# Patient Record
Sex: Male | Born: 1955 | Race: White | Hispanic: No | Marital: Married | Smoking: Never smoker
Health system: Southern US, Community
[De-identification: ages and names within clinical notes are randomized; demographics above are authoritative.]

## PROBLEM LIST (undated history)

## (undated) DIAGNOSIS — R51 Headache: Secondary | ICD-10-CM

## (undated) DIAGNOSIS — K219 Gastro-esophageal reflux disease without esophagitis: Secondary | ICD-10-CM

## (undated) DIAGNOSIS — I4892 Unspecified atrial flutter: Principal | ICD-10-CM

## (undated) DIAGNOSIS — I10 Essential (primary) hypertension: Secondary | ICD-10-CM

## (undated) DIAGNOSIS — N200 Calculus of kidney: Secondary | ICD-10-CM

## (undated) DIAGNOSIS — G4730 Sleep apnea, unspecified: Secondary | ICD-10-CM

## (undated) DIAGNOSIS — R197 Diarrhea, unspecified: Secondary | ICD-10-CM

## (undated) DIAGNOSIS — E785 Hyperlipidemia, unspecified: Secondary | ICD-10-CM

## (undated) DIAGNOSIS — C801 Malignant (primary) neoplasm, unspecified: Secondary | ICD-10-CM

## (undated) DIAGNOSIS — J302 Other seasonal allergic rhinitis: Secondary | ICD-10-CM

## (undated) DIAGNOSIS — R519 Headache, unspecified: Secondary | ICD-10-CM

## (undated) DIAGNOSIS — J40 Bronchitis, not specified as acute or chronic: Secondary | ICD-10-CM

## (undated) DIAGNOSIS — M1990 Unspecified osteoarthritis, unspecified site: Secondary | ICD-10-CM

## (undated) HISTORY — DX: Gastro-esophageal reflux disease without esophagitis: K21.9

## (undated) HISTORY — DX: Hyperlipidemia, unspecified: E78.5

## (undated) HISTORY — DX: Unspecified atrial flutter: I48.92

## (undated) HISTORY — PX: BACK SURGERY: SHX140

## (undated) HISTORY — DX: Essential (primary) hypertension: I10

## (undated) HISTORY — PX: OTHER SURGICAL HISTORY: SHX169

---

## 1993-04-20 HISTORY — PX: LUMBAR DISC SURGERY: SHX700

## 2000-03-19 ENCOUNTER — Ambulatory Visit (HOSPITAL_COMMUNITY): Admission: RE | Admit: 2000-03-19 | Discharge: 2000-03-19 | Payer: Self-pay | Admitting: Family Medicine

## 2000-03-19 ENCOUNTER — Encounter: Payer: Self-pay | Admitting: Family Medicine

## 2000-06-23 ENCOUNTER — Ambulatory Visit (HOSPITAL_COMMUNITY): Admission: RE | Admit: 2000-06-23 | Discharge: 2000-06-23 | Payer: Self-pay | Admitting: Family Medicine

## 2000-06-23 ENCOUNTER — Encounter: Payer: Self-pay | Admitting: Family Medicine

## 2000-07-15 ENCOUNTER — Observation Stay (HOSPITAL_COMMUNITY): Admission: EM | Admit: 2000-07-15 | Discharge: 2000-07-16 | Payer: Self-pay | Admitting: *Deleted

## 2000-07-15 ENCOUNTER — Encounter: Payer: Self-pay | Admitting: Emergency Medicine

## 2000-11-06 ENCOUNTER — Emergency Department (HOSPITAL_COMMUNITY): Admission: EM | Admit: 2000-11-06 | Discharge: 2000-11-06 | Payer: Self-pay

## 2002-02-18 ENCOUNTER — Emergency Department (HOSPITAL_COMMUNITY): Admission: EM | Admit: 2002-02-18 | Discharge: 2002-02-18 | Payer: Self-pay | Admitting: *Deleted

## 2003-08-11 ENCOUNTER — Emergency Department (HOSPITAL_COMMUNITY): Admission: EM | Admit: 2003-08-11 | Discharge: 2003-08-11 | Payer: Self-pay | Admitting: Emergency Medicine

## 2005-03-10 ENCOUNTER — Encounter (INDEPENDENT_AMBULATORY_CARE_PROVIDER_SITE_OTHER): Payer: Self-pay | Admitting: Specialist

## 2005-03-10 ENCOUNTER — Ambulatory Visit (HOSPITAL_COMMUNITY): Admission: RE | Admit: 2005-03-10 | Discharge: 2005-03-10 | Payer: Self-pay | Admitting: Gastroenterology

## 2005-07-31 ENCOUNTER — Ambulatory Visit: Payer: Self-pay | Admitting: Cardiology

## 2005-08-05 ENCOUNTER — Ambulatory Visit: Payer: Self-pay

## 2008-05-15 ENCOUNTER — Ambulatory Visit: Payer: Self-pay | Admitting: Cardiology

## 2008-05-15 DIAGNOSIS — E119 Type 2 diabetes mellitus without complications: Secondary | ICD-10-CM | POA: Insufficient documentation

## 2008-05-15 DIAGNOSIS — I10 Essential (primary) hypertension: Secondary | ICD-10-CM | POA: Insufficient documentation

## 2008-05-15 LAB — CONVERTED CEMR LAB
BUN: 13 mg/dL (ref 6–23)
Creatinine, Ser: 1.1 mg/dL (ref 0.4–1.5)
GFR calc Af Amer: 90 mL/min
GFR calc non Af Amer: 75 mL/min
Glucose, Bld: 161 mg/dL — ABNORMAL HIGH (ref 70–99)
Potassium: 3.9 meq/L (ref 3.5–5.1)

## 2008-06-10 ENCOUNTER — Emergency Department (HOSPITAL_COMMUNITY): Admission: EM | Admit: 2008-06-10 | Discharge: 2008-06-10 | Payer: Self-pay | Admitting: Emergency Medicine

## 2008-06-25 ENCOUNTER — Ambulatory Visit: Payer: Self-pay | Admitting: Cardiology

## 2009-04-20 DIAGNOSIS — C801 Malignant (primary) neoplasm, unspecified: Secondary | ICD-10-CM

## 2009-04-20 HISTORY — PX: SKIN CANCER EXCISION: SHX779

## 2009-04-20 HISTORY — DX: Malignant (primary) neoplasm, unspecified: C80.1

## 2009-08-28 ENCOUNTER — Inpatient Hospital Stay (HOSPITAL_COMMUNITY): Admission: EM | Admit: 2009-08-28 | Discharge: 2009-08-30 | Payer: Self-pay | Admitting: Emergency Medicine

## 2009-08-28 ENCOUNTER — Ambulatory Visit: Payer: Self-pay | Admitting: Internal Medicine

## 2009-08-29 ENCOUNTER — Encounter: Payer: Self-pay | Admitting: Cardiology

## 2009-08-29 ENCOUNTER — Encounter: Payer: Self-pay | Admitting: Internal Medicine

## 2009-08-30 ENCOUNTER — Encounter (INDEPENDENT_AMBULATORY_CARE_PROVIDER_SITE_OTHER): Payer: Self-pay | Admitting: *Deleted

## 2009-09-05 ENCOUNTER — Telehealth: Payer: Self-pay | Admitting: Cardiology

## 2009-09-12 ENCOUNTER — Ambulatory Visit: Payer: Self-pay | Admitting: Internal Medicine

## 2009-09-12 DIAGNOSIS — I4892 Unspecified atrial flutter: Secondary | ICD-10-CM | POA: Insufficient documentation

## 2009-12-22 ENCOUNTER — Emergency Department (HOSPITAL_COMMUNITY): Admission: EM | Admit: 2009-12-22 | Discharge: 2009-12-22 | Payer: Self-pay | Admitting: Emergency Medicine

## 2010-05-20 NOTE — Letter (Signed)
Summary: Return To Work  [employer], Main Office  [address]   [place], [place] [address]   Phone: [phone]  Fax: [phone]    08/30/2009  TO: WHOM IT MAY CONCERN   RE: Ryan Jones [address] [id number]   The above named individual is under my medical care and was an inpatient in the hospital 08-28-09 until 08-30-09. H emay return to work Monday 09-02-09 with no restrictions.  If you have any further questions or need additional information, please call.     Sincerely,    Deliah Goody, RN

## 2010-05-20 NOTE — Assessment & Plan Note (Signed)
Summary: eph/ appt is 3:30 per dr. Tenny Craw.gd  Medications Added LISINOPRIL-HYDROCHLOROTHIAZIDE 20-25 MG TABS (LISINOPRIL-HYDROCHLOROTHIAZIDE) Take one tablet by mouth once daily. OMEPRAZOLE 20 MG CPDR (OMEPRAZOLE) Take one tablet by mouth once daily. ACTOS 15 MG TABS (PIOGLITAZONE HCL) Take one tablet by mouth twice daily. INDOMETHACIN 25 MG CAPS (INDOMETHACIN) Take one tablet by mouth once daily. PRADAXA 150 MG CAPS (DABIGATRAN ETEXILATE MESYLATE) 1 capsule two times a day ASPIRIN EC 325 MG TBEC (ASPIRIN) Take one tablet by mouth daily MULTIVITAMINS   TABS (MULTIPLE VITAMIN) Take one tablet by mouth once daily.      Allergies Added: ! CODEINE  Visit Type:  Follow-up   History of Present Illness: Mr. Dorris returns today for followup.  He is a pleasanat middle aged man with a h/o morbid obesity who was admitted to the hospital with symptomatic atrial flutter and underwent TEE guided DCCV.  The patient has been maintained on Pradaxa and felt well.  He notes that when he went out of rhythm, he could feel exactly when it occurred.  He has subsequently had rare palpitations which have been non-sustained.   Current Medications (verified): 1)  Lisinopril-Hydrochlorothiazide 20-25 Mg Tabs (Lisinopril-Hydrochlorothiazide) .... Take One Tablet By Mouth Once Daily. 2)  Omeprazole 20 Mg Cpdr (Omeprazole) .... Take One Tablet By Mouth Once Daily. 3)  Actos 15 Mg Tabs (Pioglitazone Hcl) .... Take One Tablet By Mouth Twice Daily. 4)  Indomethacin 25 Mg Caps (Indomethacin) .... Take One Tablet By Mouth Once Daily. 5)  Pradaxa 150 Mg Caps (Dabigatran Etexilate Mesylate) .Marland Kitchen.. 1 Capsule Two Times A Day 6)  Aspirin Ec 325 Mg Tbec (Aspirin) .... Take One Tablet By Mouth Daily 7)  Multivitamins   Tabs (Multiple Vitamin) .... Take One Tablet By Mouth Once Daily.  Allergies (verified): 1)  ! Codeine  Past History:  Past Medical History: Last updated: 05/15/2008 diabetes mellitus hx of  nephrolithiasis Hypertension G E R D gout kidney stones  Past Surgical History: Last updated: 05/15/2008 Back Surgery lumbar diskectomy [id number]  Review of Systems  The patient denies syncope, dyspnea on exertion, peripheral edema, and prolonged cough.    Vital Signs:  Patient profile:   55 year old male Height:      69 inches Weight:      351 pounds BMI:     52.02 Pulse rate:   74 / minute BP sitting:   112 / 60  (left arm)  Vitals Entered By: Laurance Flatten CMA (Sep 12, 2009 3:32 PM)  Physical Exam  General:  Morbidly obese, well developed, well nourished, in no acute distress.  HEENT: normal Neck: supple. No JVD. Carotids 2+ bilaterally no bruits Cor: RRR no rubs, gallops or murmur Lungs: CTA with no wheezes or rhonchi. Ab: soft, nontender. nondistended. No HSM. Good bowel sounds. Obese. Ext: warm. no cyanosis, clubbing or edema Neuro: alert and oriented. Grossly nonfocal. affect pleasant    Impression & Recommendations:  Problem # 1:  ATRIAL FLUTTER (ICD-427.32) His atrial flutter is currently quiet.  I have discussed the treatment options with the patient.  I have recommended we undergo a period of watchful waiting prior to proceeding with ablation.  He will continue on his current meds.  I am concerned that even if we proceed with flutter ablation he may ultimately have atrial fibrillation with his morbid obesity. His updated medication list for this problem includes:    Aspirin Ec 325 Mg Tbec (Aspirin) .Marland Kitchen... Take one tablet by mouth daily  Problem # 2:  ESSENTIAL HYPERTENSION, BENIGN (ICD-401.1) His blood pressure appears to be well controlled.  Continue current meds and maintain a low sodium diet. His updated medication list for this problem includes:    Lisinopril-hydrochlorothiazide 20-25 Mg Tabs (Lisinopril-hydrochlorothiazide) .Marland Kitchen... Take one tablet by mouth once daily.    Aspirin Ec 325 Mg Tbec (Aspirin) .Marland Kitchen... Take one tablet by mouth daily  Patient  Instructions: 1)  Your physician recommends that you schedule a follow-up appointment in: 4 months with Dr Ladona Ridgel

## 2010-05-20 NOTE — Progress Notes (Signed)
Summary: pt heart skipping beats   Phone Note Call from Patient Call back at Home Phone [phone]   Caller: Patient Reason for Call: Talk to Nurse, Talk to Doctor Summary of Call: pt has an appt on the 26th but his heart is skipping beats should he be seen before then Initial call taken by: Omer Jack,  Sep 05, 2009 10:11 AM  Follow-up for Phone Call        spoke with pt, he has been walking alot since he left the hosp and he is feeling his heart skip. his heart rate when on the phone with me is 52. he is anxious about the skipping, afraid his heart is going to stop. reassurance given to pt. he has an appt next week with dr Ladona Ridgel and will call prior to that appt with problems Deliah Goody, RN  Sep 05, 2009 2:51 PM\par

## 2010-07-08 LAB — CBC
HCT: 41.1 % (ref 39.0–52.0)
HCT: 42 % (ref 39.0–52.0)
HCT: 43.8 % (ref 39.0–52.0)
MCHC: 35.4 g/dL (ref 30.0–36.0)
MCV: 91.8 fL (ref 78.0–100.0)
MCV: 92.2 fL (ref 78.0–100.0)
Platelets: 122 10*3/uL — ABNORMAL LOW (ref 150–400)
Platelets: 136 10*3/uL — ABNORMAL LOW (ref 150–400)
Platelets: 151 10*3/uL (ref 150–400)
RDW: 13.2 % (ref 11.5–15.5)
RDW: 13.2 % (ref 11.5–15.5)
RDW: 13.2 % (ref 11.5–15.5)

## 2010-07-08 LAB — BASIC METABOLIC PANEL
BUN: 17 mg/dL (ref 6–23)
Creatinine, Ser: 1.01 mg/dL (ref 0.4–1.5)
GFR calc non Af Amer: >60 mL/min (ref >60)
Glucose, Bld: 109 mg/dL — ABNORMAL HIGH (ref 70–99)
Potassium: 4.3 mEq/L (ref 3.5–5.1)

## 2010-07-08 LAB — GLUCOSE, CAPILLARY
Glucose-Capillary: 114 mg/dL — ABNORMAL HIGH (ref 70–99)
Glucose-Capillary: 121 mg/dL — ABNORMAL HIGH (ref 70–99)
Glucose-Capillary: 127 mg/dL — ABNORMAL HIGH (ref 70–99)
Glucose-Capillary: 149 mg/dL — ABNORMAL HIGH (ref 70–99)
Glucose-Capillary: 173 mg/dL — ABNORMAL HIGH (ref 70–99)
Glucose-Capillary: 211 mg/dL — ABNORMAL HIGH (ref 70–99)

## 2010-07-08 LAB — POCT CARDIAC MARKERS
CKMB, poc: 2.6 ng/mL (ref 1.0–8.0)
CKMB, poc: 3.2 ng/mL (ref 1.0–8.0)
Troponin i, poc: <0.05 ng/mL (ref 0.00–0.09)

## 2010-07-08 LAB — CK TOTAL AND CKMB (NOT AT ARMC)
CK, MB: 3.9 ng/mL (ref 0.3–4.0)
Relative Index: 0.7 (ref 0.0–2.5)

## 2010-07-08 LAB — HEPARIN LEVEL (UNFRACTIONATED): Heparin Unfractionated: 0.66 IU/mL (ref 0.30–0.70)

## 2010-07-08 LAB — HEMOGLOBIN A1C: Hgb A1c MFr Bld: 7.1 % — ABNORMAL HIGH (ref <5.7)

## 2010-07-08 LAB — CARDIAC PANEL(CRET KIN+CKTOT+MB+TROPI)
CK, MB: 3.3 ng/mL (ref 0.3–4.0)
Relative Index: 0.7 (ref 0.0–2.5)
Total CK: 473 U/L — ABNORMAL HIGH (ref 7–232)

## 2010-09-02 NOTE — Assessment & Plan Note (Signed)
[facility]                            CARDIOLOGY OFFICE NOTE   NAME:Ryan Jones, Ryan Jones                     MRN:          [id number]  DATE:06/25/2008                            DOB:          Sep 03, 1955    Ryan Jones is a 55 year old gentleman that I saw on May 15, 2008.  It was noted at that time that he had a previous Myoview performed in  April 2007 that showed an ejection fraction of 63% and normal perfusion.  This was performed secondary to atypical chest pain.  When I saw him, he  was having problems with blood pressure and cramping sensation.  We  wondered whether the HCT was contributing as well as hypokalemia.  We  did check BMET that showed a potassium of 3.9 and his HCT was  discontinued.  He is continued on lisinopril 20 mg p.o. daily.  Since  then, he has tracked his blood pressure and it appears to be reasonable.  The systolic runs in the 120-130 range and his diastolic in the 60-70  range.  He denies any dyspnea, chest pain, palpitations, or syncope.  He  did have some problems with arthralgias, but was placed on allopurinol  by an orthopedist and his symptoms have completely resolved.  He has  also had a recent URI.  His medications at present include a  multivitamin daily, allergy injections, Avandamet, omeprazole 20 mg p.o.  daily, lisinopril 20 mg p.o. daily, allopurinol 100 mg p.o. daily.   PHYSICAL EXAMINATION:  VITAL SIGNS:  Blood pressure 155/89 and his pulse  is 96.  He weighs 348 pounds.  HEENT:  Normal.  NECK:  Supple.  CHEST:  Clear.  CARDIOVASCULAR:  Regular rate and rhythm.  ABDOMEN:  No tenderness.  EXTREMITIES:  No edema.   DIAGNOSES:  1. Hypertension - the patient's blood pressure is mildly elevated      today.  However, he did bring records and he is checking it at his      home and his systolic is typically in 120 to 130 range with the      diastolic in the 60 to 70 range.  We also checked his monitoring   compared to hours and it is accurate.  I therefore do not think we      need to increase his blood pressure regimen at this point.  He will      continue to track it and his lisinopril could be increased as      needed.  I have asked him to follow up his primary care physician      concerning this issue.  2. Recently diagnosed gout - the patient will continue on his      allopurinol and he will follow up with his primary care physician      concerning this issue as well.  3. Diabetes mellitus.   I will see him back on an as-needed basis.     Madolyn Frieze Jens Som, MD, [facility]  Electronically Signed   BSC/MedQ  DD: 06/25/2008  DT: 06/26/2008  Job #: [id number]

## 2010-09-02 NOTE — Assessment & Plan Note (Signed)
[facility]                            CARDIOLOGY OFFICE NOTE   NAME:Ryan Jones, Ryan Jones                     MRN:          [id number]  DATE:05/15/2008                            DOB:          1955-06-12    Ryan Jones is a pleasant 55 year old gentleman that I have not seen  since April 2007.  We last saw him at that time secondary to atypical  chest pain.  We did perform a Myoview on August 05, 2005, that showed an  ejection fraction of 63% and normal perfusion.  Since I last saw him, he  denies any dyspnea, chest pain, palpitations, or pedal edema.  Approximately 6 months ago, he did state that he had his metoprolol  discontinued as his blood pressure was falling and he was having  episodes of near syncope.  This did improve.  However, the patient now  states that over the past several weeks he has had problems with cramps  and arthralgias.  He ran out of his lisinopril HCT transiently and his  symptoms improved.  He resumed the medication and the symptoms returned.  He therefore requested to be seen for his cramps and his blood pressure.  He states his typical systolic blood pressure runs around 135 with the  diastolic in the 60s.   His present medications include:  1. Multivitamin daily.  2. Allergy injections.  3. Avandamet 07/998 daily.  4. Omeprazole 20 mg p.o. daily.  5. Lisinopril HCT 25/12.5 mg p.o. daily.   Physical exam shows a blood pressure 130/90 and his pulse is 70.  He  weighs 347 pounds.  His HEENT is normal.  His neck supple.  Chest clear.  Cardiovascular exam reveals a regular rate and rhythm.  Abdominal exam  shows no tenderness.  His extremities show no edema.   His electrocardiogram shows a sinus rhythm at a rate of 70.  The axis is  normal.  There are no ST changes noted.   DIAGNOSES:  1. Cramping/extremity pain - this may be related to low potassium.  We      will check a BMET today.  2. Hypertension - given the above  symptoms, we will discontinue the      patient's lisinopril HCT.  We will try to control his blood      pressure with lisinopril alone.  We will begin with 20 mg p.o.      daily.  I have asked him to track his blood pressure at home and      keep records for [place].  He will return in 6 weeks and he will bring      his cuff at that time to see if it correlates with ours.  We will      then adjust his regimen based on those results.  3. Diabetes mellitus - management per his primary care physician.     Madolyn Frieze Jens Som, MD, [facility]  Electronically Signed    BSC/MedQ  DD: 05/15/2008  DT: 05/15/2008  Job #: [id number]

## 2010-09-05 NOTE — H&P (Signed)
[facility]  Patient:    Ryan Jones, Ryan Jones                     MRN: [id number] Adm. Date:  [id number] Disc. Date: [id number] Attending:  Trinna Post CC:         Madolyn Frieze. Jens Som, M.D. [facility]   History and Physical  CHIEF COMPLAINT:  "Chest pain, weakness, and shortness of breath."  HISTORY OF PRESENT ILLNESS:  He is a 55 year old gentleman with a history of morbid obesity, hypertension, left ventricular hypertrophy, and gastroesophageal reflux disease, who presents with two episodes of chest pain over the past 36 hours.  Yesterday he was cycling and after about 10 minutes of exercise noted severe dyspnea followed by tightness in the left precordial area, with a radiation down the left upper extremity lasting approximately 30 minutes.  This went away with rest.  Then today around 12:30 p.m. while walking, he noted increased weakness and dizziness followed by heaviness substernally with associated dyspnea and diaphoresis.  He presented here to th emergency room for further evaluation.  EKG showed no acute changes, with initial negative enzymes.  He had recent 2D echocardiogram June 29, 2000, which showed left ventricular hypertrophy with no obvious left ventricular dysfunction.  His Lopressor dose was increased at that time from 50 mg to 100 mg.  He has noticed some nonspecific symptoms of increased weakness since then.  His cardiac risk factors include obesity, hypertension, and family history of coronary artery disease.  PAST MEDICAL HISTORY:  Chronic problems:  Hypertension with LVH by echocardiogram March 12, 202, gout, gastroesophageal reflux disease, and kidney stones.  MEDICATIONS: 1. Nexium 40 mg a day. 2. Lopressor 100 mg a day. 3. Hydrochlorothiazide 25 mg a day.  ALLERGIES:  CODEINE, which causes pruritus.  PAST SURGICAL HISTORY:  Lumbar diskectomy 1980, 1995.  SOCIAL HISTORY:  He is married with a 57 year old daughter.  He has  no history of smoking or alcohol use.  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  FAMILY HISTORY:  Father died of coronary artery disease complications, 61. Mother died at age 22, uncertain cause.  Ten siblings, one sister with coronary artery disease at age 1, a brother with type 2 diabetes.  REVIEW OF SYSTEMS:  As per HPI.  Otherwise noncontributory.  PHYSICAL EXAMINATION:  VITAL SIGNS:  Temperature is 97.4, blood pressure 141/70, pulse 56, O2 saturations are 98% on 2 L nasal cannula.  GENERAL:  He is an alert, morbidly obese 55 year old gentleman lying supine, in no apparent distress.  HEENT:  Oropharynx is clear.  Pupils are equal, round and reactive to light.  NECK:  Supple with no nodes or bruits.  CHEST:   Clear to auscultation.  CARDIAC:  Regular rhythm and rate without murmurs.  ABDOMEN:  Soft and nontender without hepatosplenomegaly.  EXTREMITIES:  No edema, 2+ dorsalis pedis pulses bilaterally.  Calves are nontender.  NEUROLOGIC:  Cranial nerves II-XII are normal.  LABORATORY DATA:  EKG:  Normal sinus rhythm with no acute changes.  CK is 236, MB 3.9, troponin negative.  CBC normal.  Electrolytes:  Sodium 141, potassium 4.1, BUN 10, creatinine 1.3.  IMPRESSION:  This is a 55 year old gentleman with moderate risk factors for coronary artery disease presenting with somewhat atypical chest pain, though at least one recent episode of the chest pain may have been related to exertion.  It  is possible that some of his recent symptoms such as weakness and dyspnea on exertion may be related to increased dose of beta blocker.  PLAN:  Admit to rule out MI.  He is placed on O2, aspirin, and p.r.n. nitroglycerin.  Reduce Lopressor to 50 mg q.d.  Cardiology consulted for further evaluation. DD:  07/15/00 TD:  07/16/00 Job: [id number]

## 2010-09-05 NOTE — Discharge Summary (Signed)
[facility]  Patient:    Ryan Jones, Ryan Jones                     MRN: [id number] Adm. Date:  [id number] Disc. Date: [id number] Attending:  Trinna Post CC:         Madolyn Frieze. Jens Som, M.D. [facility]   Discharge Summary  ADMISSION DIAGNOSES: 1. Atypical chest pain. 2. Hypertension. 3. Gastroesophageal reflux disease. 4. Weakness and dizziness (likely secondary to increased Lopressor).  DISCHARGE DIAGNOSES: 1. Atypical chest pain. 2. Hypertension. 3. Gastroesophageal reflux disease. 4. Weakness and dizziness (likely secondary to increased Lopressor).  HISTORY OF PRESENT ILLNESS:  This is a 55 year old white male with a past medical history significant for back surgery, GERD, PUD, nephrolithiasis, and hypertension.  Three weeks ago he had his Lopressor increased to 100 mg per day from 50 mg.  He has complained of some fatigue, shortness of breath since that time, and minimal dizziness.  He had some left upper sided chest pain without radiation, was not postprandial.  Today while walking, he developed sudden dizziness and weakness, came to the emergency room, no syncope, was felt that MI needed to be ruled out.  The rest of the history and physical can be obtained from the admit note.  HOSPITAL COURSE:  The patient was admitted.  EKG, CK-MB, troponins, etc., were checked.  Also a spiral CT was done to rule out pulmonary embolus which was normal.  His Lopressor was decreased to 50.  His blood pressure remained normal while in the hospital.  Initially, it was slightly low.  His telemetry showed no abnormalities.  It was felt he could be discharged, and will be scheduled for a stress Cardiolite on Monday.  DISPOSITION:  The patient is discharged to home with instructions to call if he has any chest discomfort.  DISCHARGE MEDICATIONS: 1. Lopressor 50 mg p.o. q.d. 2. Hydrochlorothiazide 25 mg p.o. q.d. 3. Nexium 40 mg p.o. q.d. 4. ECASA 325 mg p.o.  q.d.  ACTIVITY:  As tolerated.  DIET:  He will follow a low fat, sodium diet.  CONDITION ON DISCHARGE:  Improved.  He will call me if he has any questions over the long weekend.  Dr. Waunita Schooner office will call and let him know what time he needs to be seen on Monday for his stress Cardiolite.  LABORATORY DATA/X-RAYS:  On 06/23/00, he had an upper GI series that was normal. His EKG showed a sinus bradycardia, but no other abnormalities.  On March 6, he had a 2-D echocardiogram that showed left ventricular wall thickness, aortic valve thickness that was mildly increased, left atrium mildly dilated. He had a spiral CT done while in the hospital that was normal.  His troponin was 0.03.  Potassium 4.1.  BUN and creatinine 10/1.3, respectively.  Initial CK was 236 with an MB of 3.9 and a relative index of 1.7.  Second CK was 176 with an MB of 2.5, and a relative index of 1.4. DD:  07/16/00 TD:  07/16/00 Job: [id number]

## 2011-01-27 ENCOUNTER — Emergency Department (HOSPITAL_COMMUNITY): Payer: Federal, State, Local not specified - PPO

## 2011-01-27 ENCOUNTER — Emergency Department (HOSPITAL_COMMUNITY)
Admission: EM | Admit: 2011-01-27 | Discharge: 2011-01-27 | Disposition: A | Discharge status: Home / Self Care | Payer: Federal, State, Local not specified - PPO | Attending: Emergency Medicine | Admitting: Emergency Medicine

## 2011-01-27 DIAGNOSIS — R000 Tachycardia, unspecified: Secondary | ICD-10-CM | POA: Insufficient documentation

## 2011-01-27 DIAGNOSIS — F411 Generalized anxiety disorder: Secondary | ICD-10-CM | POA: Insufficient documentation

## 2011-01-27 DIAGNOSIS — I4891 Unspecified atrial fibrillation: Secondary | ICD-10-CM | POA: Insufficient documentation

## 2011-01-27 DIAGNOSIS — R002 Palpitations: Secondary | ICD-10-CM | POA: Insufficient documentation

## 2011-01-27 DIAGNOSIS — E669 Obesity, unspecified: Secondary | ICD-10-CM | POA: Insufficient documentation

## 2011-01-27 DIAGNOSIS — E119 Type 2 diabetes mellitus without complications: Secondary | ICD-10-CM | POA: Insufficient documentation

## 2011-01-27 DIAGNOSIS — Z8639 Personal history of other endocrine, nutritional and metabolic disease: Secondary | ICD-10-CM | POA: Insufficient documentation

## 2011-01-27 DIAGNOSIS — I4892 Unspecified atrial flutter: Secondary | ICD-10-CM | POA: Insufficient documentation

## 2011-01-27 DIAGNOSIS — Z79899 Other long term (current) drug therapy: Secondary | ICD-10-CM | POA: Insufficient documentation

## 2011-01-27 DIAGNOSIS — Z862 Personal history of diseases of the blood and blood-forming organs and certain disorders involving the immune mechanism: Secondary | ICD-10-CM | POA: Insufficient documentation

## 2011-01-27 DIAGNOSIS — I10 Essential (primary) hypertension: Secondary | ICD-10-CM | POA: Insufficient documentation

## 2011-01-27 DIAGNOSIS — R42 Dizziness and giddiness: Secondary | ICD-10-CM | POA: Insufficient documentation

## 2011-01-27 LAB — CBC
HCT: 41.3 % (ref 39.0–52.0)
Hemoglobin: 14.4 g/dL (ref 13.0–17.0)
MCH: 30.9 pg (ref 26.0–34.0)
MCHC: 34.9 g/dL (ref 30.0–36.0)
MCV: 88.6 fL (ref 78.0–100.0)

## 2011-01-27 LAB — BASIC METABOLIC PANEL
BUN: 13 mg/dL (ref 6–23)
CO2: 27 mEq/L (ref 19–32)
Chloride: 104 mEq/L (ref 96–112)
Creatinine, Ser: 0.96 mg/dL (ref 0.50–1.35)

## 2011-01-27 LAB — DIFFERENTIAL
Lymphocytes Relative: 30 % (ref 12–46)
Lymphs Abs: 1.6 10*3/uL (ref 0.7–4.0)
Monocytes Absolute: 0.4 10*3/uL (ref 0.1–1.0)
Monocytes Relative: 8 % (ref 3–12)
Neutro Abs: 2.8 10*3/uL (ref 1.7–7.7)

## 2011-01-28 ENCOUNTER — Encounter: Payer: Self-pay | Admitting: Cardiology

## 2011-01-28 ENCOUNTER — Encounter: Payer: Self-pay | Admitting: *Deleted

## 2011-01-28 ENCOUNTER — Ambulatory Visit (INDEPENDENT_AMBULATORY_CARE_PROVIDER_SITE_OTHER): Payer: Federal, State, Local not specified - PPO | Admitting: Cardiology

## 2011-01-28 VITALS — BP 120/70 | HR 62 | Ht 69.0 in | Wt 366.0 lb

## 2011-01-28 DIAGNOSIS — I4892 Unspecified atrial flutter: Secondary | ICD-10-CM

## 2011-01-28 DIAGNOSIS — I10 Essential (primary) hypertension: Secondary | ICD-10-CM

## 2011-01-28 NOTE — Progress Notes (Signed)
Ryan Jones male for followup of hypertension and prior atrial flutter. Prior Myoview in April of 2007 showed and ejection fraction of 63% and normal perfusion. In May of 2011the patient had an episode of atrial flutter. Echocardiogram showed normal LV function and he ultimately underwent cardioversion. Patient was seen in followup by Dr. Ladona Ridgel and a period of watchful waiting was recommended and not ablation. There was concern that the patient may develop atrial fibrillation in the future given his obesity. Patient seen in the emergency room yesterday with complaints of palpitations. Noted to be in atrial flutter. Started on Cardizem and aspirin. Troponin negative. Renal function, chest x-ray and hemoglobin normal. Since that time, his symptoms have resolved. He denies dyspnea, chest pain or syncope.   Current Outpatient Prescriptions  Medication Sig Dispense Refill  . aspirin 325 MG tablet Take 325 mg by mouth daily.        Marland Kitchen diltiazem (CARDIZEM CD) 180 MG 24 hr capsule Take 180 mg by mouth daily.        Marland Kitchen glipiZIDE (GLUCOTROL) 5 MG tablet Take 5 mg by mouth 2 (two) times daily before a meal.        . indomethacin (INDOCIN) 25 MG capsule Take 25 mg by mouth 2 (two) times daily with a meal.        . lisinopril-hydrochlorothiazide (PRINZIDE,ZESTORETIC) 20-25 MG per tablet Take 1 tablet by mouth daily.        . Multiple Vitamin (MULTIVITAMIN) capsule Take 1 capsule by mouth daily.        Marland Kitchen omeprazole (PRILOSEC) 20 MG capsule Take 20 mg by mouth daily.        . pioglitazone-metformin (ACTOPLUS MET) 15-850 MG per tablet Take 1 tablet by mouth 2 (two) times daily with a meal.           Past Medical History  Diagnosis Date  . Atrial flutter   . Hypertension   . GERD (gastroesophageal reflux disease)   . Diabetes mellitus   . Gout     No past surgical history on file.  History   Social History  . Marital Status: Married    Spouse Name: N/A    Number of Children: N/A  . Years of  Education: N/A   Occupational History  . Not on file.   Social History Main Topics  . Smoking status: Never Smoker   . Smokeless tobacco: Not on file  . Alcohol Use: Not on file  . Drug Use: Not on file  . Sexually Active: Not on file   Other Topics Concern  . Not on file   Social History Narrative  . No narrative on file    ROS: no fevers or chills, productive cough, hemoptysis, dysphasia, odynophagia, melena, hematochezia, dysuria, hematuria, rash, seizure activity, orthopnea, PND, pedal edema, claudication. Remaining systems are negative.  Physical Exam: Well-developed obese in no acute distress.  Skin is warm and dry.  HEENT is normal.  Neck is supple. No thyromegaly.  Chest is clear to auscultation with normal expansion.  Cardiovascular exam is regular rate and rhythm.  Abdominal exam nontender or distended. No masses palpated. Extremities show no edema. neuro grossly intact  ECG NSR, no ST changes 01/27/11 atrial flutter

## 2011-01-28 NOTE — Assessment & Plan Note (Signed)
Blood pressure controlled. Continue present medications. 

## 2011-01-28 NOTE — Patient Instructions (Signed)
Your physician wants you to follow-up in: As needed with Dr Jens Som   Your physician wants you to follow-up in: Make an appointment to see Dr Graciela Husbands for consideration for Atrial Flutter Ablation   Your physician recommends that you return for lab work in: today 01/28/11 [place])  Your physician has requested that you have an echocardiogram. Echocardiography is a painless test that uses sound waves to create images of your heart. It provides your doctor with information about the size and shape of your heart and how well your heart's chambers and valves are working. This procedure takes approximately one hour. There are no restrictions for this procedure.

## 2011-01-28 NOTE — Assessment & Plan Note (Signed)
Patient with bout of recurrent atrial flutter. Plan continue Cardizem. Check echocardiogram and TSH. Embolic risk factors of diabetes and hypertension. I will refer him to Dr. Graciela Husbands for atrial flutter ablation. I would like to avoid Coumadin long-term. No history of atrial fibrillation. Given that patient is in sinus rhythm today and atrial flutter could potentially be ablated in the near future we'll continue aspirin and not add Coumadin at this point.

## 2011-01-30 ENCOUNTER — Other Ambulatory Visit (HOSPITAL_COMMUNITY): Payer: Self-pay | Admitting: Cardiology

## 2011-01-30 ENCOUNTER — Ambulatory Visit (HOSPITAL_COMMUNITY): Payer: Federal, State, Local not specified - PPO | Attending: Cardiology | Admitting: Radiology

## 2011-01-30 DIAGNOSIS — IMO0002 Reserved for concepts with insufficient information to code with codable children: Secondary | ICD-10-CM

## 2011-01-30 DIAGNOSIS — I4892 Unspecified atrial flutter: Secondary | ICD-10-CM | POA: Insufficient documentation

## 2011-01-30 DIAGNOSIS — I10 Essential (primary) hypertension: Secondary | ICD-10-CM | POA: Insufficient documentation

## 2011-01-30 DIAGNOSIS — E119 Type 2 diabetes mellitus without complications: Secondary | ICD-10-CM | POA: Insufficient documentation

## 2011-01-30 MED ORDER — PERFLUTREN PROTEIN A MICROSPH IV SUSP
0.5000 mL | Freq: Once | INTRAVENOUS | Status: AC
  Administered 2011-01-30: 0.5 mL via INTRAVENOUS

## 2011-02-02 ENCOUNTER — Encounter: Payer: Self-pay | Admitting: *Deleted

## 2011-02-02 ENCOUNTER — Encounter: Payer: Self-pay | Admitting: Internal Medicine

## 2011-02-02 ENCOUNTER — Ambulatory Visit (INDEPENDENT_AMBULATORY_CARE_PROVIDER_SITE_OTHER): Payer: Federal, State, Local not specified - PPO | Admitting: Internal Medicine

## 2011-02-02 VITALS — BP 144/84 | HR 83 | Resp 18 | Ht 69.0 in | Wt 373.8 lb

## 2011-02-02 DIAGNOSIS — I4892 Unspecified atrial flutter: Secondary | ICD-10-CM

## 2011-02-02 DIAGNOSIS — I4891 Unspecified atrial fibrillation: Secondary | ICD-10-CM

## 2011-02-02 NOTE — Assessment & Plan Note (Signed)
The patient has recurrent documented typical atrial flutter. His thromboembolic risk profile as a CHADS score of 2;  We have discussed the role of catheter ablation both to minimize his symptoms associated with recurrent atrial flutter which is estimated to be 90+ percent as well as to hopefully reduce the risk of thromboembolism.  In the event that we were successful with ablation, I would not feel that he needed longer-term anticoagulation unless he developed recurrent atrial arrhythmias either flutter or fibrillation the likelihood of which the latter I estimated at 30-50%. Given his size, I recommended that we undertake the procedure with general anesthesia. He is agreeable. We've discussed potential benefits and risks of the procedure including but not limited to death perforation vascular injury and bleeding and heart block requiring pacer. He understands and agrees and is willing to proceed;  we'll keep him on aspirin and his short-term

## 2011-02-02 NOTE — Progress Notes (Signed)
HPI: [name] is seen at the request of Dr. Jens Som for consideration of catheter ablation of atrial flutter.  He initially presented in May 2011.Ryan Jones He underwent cardioversion. He saw Dr. Ladona Ridgel and .conservative watchful waiting was recommended because of concern about atrial fibrillation He then had another episode in October 2012; this was associated with significant symptoms of shortness of breath diaphoresis lightheadedness. He went to the emergency room and again reverted spontaneously.  The thromboembolic risk factors are notable for diabetes and hypertension. He is currently managed with aspirin  Prior Myoview in April of 2007 showed and ejection fraction of 63% and normal perfusion..     Current Outpatient Prescriptions  Medication Sig Dispense Refill  . aspirin 325 MG tablet Take 325 mg by mouth daily.        Ryan Jones diltiazem (CARDIZEM CD) 180 MG 24 hr capsule Take 180 mg by mouth daily.        Ryan Jones glipiZIDE (GLUCOTROL) 5 MG tablet Take 5 mg by mouth 2 (two) times daily before a meal.        . indomethacin (INDOCIN) 25 MG capsule Take 25 mg by mouth 2 (two) times daily with a meal.        . lisinopril-hydrochlorothiazide (PRINZIDE,ZESTORETIC) 20-25 MG per tablet Take 1 tablet by mouth daily.        . Multiple Vitamin (MULTIVITAMIN) capsule Take 1 capsule by mouth daily.        Ryan Jones omeprazole (PRILOSEC) 20 MG capsule Take 20 mg by mouth daily.        . pioglitazone-metformin (ACTOPLUS MET) 15-850 MG per tablet Take 1 tablet by mouth 2 (two) times daily with a meal.           Past Medical History  Diagnosis Date  . Atrial flutter   . Hypertension   . GERD (gastroesophageal reflux disease)   . Diabetes mellitus   . Gout     No past surgical history on file.  History   Social History  . Marital Status: Married    Spouse Name: N/A    Number of Children: N/A  . Years of Education: N/A   Occupational History  . Not on file.   Social History Main Topics  . Smoking status:  Never Smoker   . Smokeless tobacco: Never Used  . Alcohol Use: No  . Drug Use: No  . Sexually Active: Not on file   Other Topics Concern  . Not on file   Social History Narrative  . No narrative on file    ROS: no fevers or chills, productive cough, hemoptysis, dysphasia, odynophagia, melena, hematochezia, dysuria, hematuria, rash, seizure activity, orthopnea, PND, pedal edema, claudication. Remaining systems are negative.  Physical Exam: Well-developed obese middle-aged Caucasian male appearing his stated age in no acute distress.  Skin is warm and dry. Multiple skin to HEENT is normal.  Neck is supple. No thyromegaly Lymphadenopathy negative Chest is clear to auscultation with normal expansion.  Cardiovascular exam is irregular, with occasional dropped beat Abdominal exam nontender or distended. No masses palpated. Extremities show no edema Clubbing or cyanosis Neuro alert and oriented x3 grossly normal motor and sensory function Affect is engaging Pulses are intact  ECG NSR, no ST changes 01/27/11 atrial flutter

## 2011-02-04 ENCOUNTER — Encounter: Payer: Self-pay | Admitting: *Deleted

## 2011-02-19 ENCOUNTER — Telehealth: Payer: Self-pay | Admitting: Internal Medicine

## 2011-02-19 NOTE — Telephone Encounter (Signed)
Having procedure next week, wants to know if/when he should dc diltiazem and/or aspirin, pls call [phone]

## 2011-02-19 NOTE — Telephone Encounter (Signed)
Patient is aware to continue taken Aspirin and Diltiazem as recommended. Patient verbalized understanding.

## 2011-02-23 ENCOUNTER — Other Ambulatory Visit (INDEPENDENT_AMBULATORY_CARE_PROVIDER_SITE_OTHER): Payer: Federal, State, Local not specified - PPO | Admitting: *Deleted

## 2011-02-23 ENCOUNTER — Encounter (HOSPITAL_COMMUNITY): Payer: Self-pay

## 2011-02-23 DIAGNOSIS — I4891 Unspecified atrial fibrillation: Secondary | ICD-10-CM

## 2011-02-23 LAB — CBC WITH DIFFERENTIAL/PLATELET
Basophils Absolute: 0 10*3/uL (ref 0.0–0.1)
Basophils Relative: 0.5 % (ref 0.0–3.0)
Eosinophils Absolute: 0.3 10*3/uL (ref 0.0–0.7)
HCT: 41.7 % (ref 39.0–52.0)
Hemoglobin: 14.3 g/dL (ref 13.0–17.0)
Lymphs Abs: 1.5 10*3/uL (ref 0.7–4.0)
MCHC: 34.4 g/dL (ref 30.0–36.0)
Monocytes Relative: 7.4 % (ref 3.0–12.0)
Neutro Abs: 3.3 10*3/uL (ref 1.4–7.7)
RDW: 13.7 % (ref 11.5–14.6)

## 2011-02-23 LAB — BASIC METABOLIC PANEL
BUN: 19 mg/dL (ref 6–23)
Chloride: 105 mEq/L (ref 96–112)
Potassium: 4.3 mEq/L (ref 3.5–5.1)

## 2011-02-25 MED ORDER — SODIUM CHLORIDE 0.45 % IV SOLN: INTRAVENOUS | Status: DC

## 2011-02-26 ENCOUNTER — Other Ambulatory Visit: Payer: Self-pay

## 2011-02-26 ENCOUNTER — Ambulatory Visit (HOSPITAL_COMMUNITY): Payer: Federal, State, Local not specified - PPO | Admitting: Certified Registered Nurse Anesthetist

## 2011-02-26 ENCOUNTER — Encounter (HOSPITAL_COMMUNITY): Payer: Self-pay | Admitting: Certified Registered Nurse Anesthetist

## 2011-02-26 ENCOUNTER — Ambulatory Visit (HOSPITAL_COMMUNITY)
Admission: RE | Admit: 2011-02-26 | Discharge: 2011-02-27 | Disposition: A | Discharge status: Home / Self Care | Payer: Federal, State, Local not specified - PPO | Source: Ambulatory Visit | Attending: Internal Medicine | Admitting: Internal Medicine

## 2011-02-26 ENCOUNTER — Encounter (HOSPITAL_COMMUNITY)
Admission: RE | Disposition: A | Discharge status: Home / Self Care | Payer: Self-pay | Source: Ambulatory Visit | Attending: Internal Medicine

## 2011-02-26 ENCOUNTER — Encounter (HOSPITAL_COMMUNITY): Payer: Self-pay | Admitting: General Practice

## 2011-02-26 DIAGNOSIS — I4892 Unspecified atrial flutter: Secondary | ICD-10-CM

## 2011-02-26 DIAGNOSIS — E119 Type 2 diabetes mellitus without complications: Secondary | ICD-10-CM | POA: Insufficient documentation

## 2011-02-26 DIAGNOSIS — I10 Essential (primary) hypertension: Secondary | ICD-10-CM | POA: Insufficient documentation

## 2011-02-26 DIAGNOSIS — K219 Gastro-esophageal reflux disease without esophagitis: Secondary | ICD-10-CM | POA: Insufficient documentation

## 2011-02-26 DIAGNOSIS — J302 Other seasonal allergic rhinitis: Secondary | ICD-10-CM

## 2011-02-26 DIAGNOSIS — M109 Gout, unspecified: Secondary | ICD-10-CM | POA: Insufficient documentation

## 2011-02-26 HISTORY — PX: CARDIAC ELECTROPHYSIOLOGY STUDY AND ABLATION: SHX1294

## 2011-02-26 HISTORY — DX: Diarrhea, unspecified: R19.7

## 2011-02-26 HISTORY — DX: Unspecified osteoarthritis, unspecified site: M19.90

## 2011-02-26 HISTORY — DX: Unspecified atrial flutter: I48.92

## 2011-02-26 HISTORY — DX: Malignant (primary) neoplasm, unspecified: C80.1

## 2011-02-26 HISTORY — PX: ATRIAL FLUTTER ABLATION: SHX5733

## 2011-02-26 HISTORY — DX: Other seasonal allergic rhinitis: J30.2

## 2011-02-26 HISTORY — DX: Bronchitis, not specified as acute or chronic: J40

## 2011-02-26 LAB — GLUCOSE, CAPILLARY
Glucose-Capillary: 125 mg/dL — ABNORMAL HIGH (ref 70–99)
Glucose-Capillary: 110 mg/dL — ABNORMAL HIGH (ref 70–99)

## 2011-02-26 SURGERY — ATRIAL FLUTTER ABLATION
Anesthesia: General

## 2011-02-26 MED ORDER — GLIPIZIDE 5 MG PO TABS
5.0000 mg | ORAL_TABLET | Freq: Two times a day (BID) | ORAL | Status: DC
  Administered 2011-02-27: 5 mg via ORAL
  Filled 2011-02-26 (×3): qty 1

## 2011-02-26 MED ORDER — ONDANSETRON HCL 4 MG/2ML IJ SOLN: 4.0000 mg | Freq: Four times a day (QID) | INTRAMUSCULAR | Status: DC | PRN

## 2011-02-26 MED ORDER — ASPIRIN EC 81 MG PO TBEC
81.0000 mg | DELAYED_RELEASE_TABLET | Freq: Every day | ORAL | Status: DC
  Filled 2011-02-26: qty 1

## 2011-02-26 MED ORDER — PIOGLITAZONE HCL-METFORMIN HCL 15-850 MG PO TABS: 1.0000 | ORAL_TABLET | Freq: Two times a day (BID) | ORAL | Status: DC

## 2011-02-26 MED ORDER — PROMETHAZINE HCL 25 MG/ML IJ SOLN: 6.2500 mg | INTRAMUSCULAR | Status: DC | PRN

## 2011-02-26 MED ORDER — SUCCINYLCHOLINE CHLORIDE 20 MG/ML IJ SOLN
INTRAMUSCULAR | Status: DC | PRN
  Administered 2011-02-26: 140 mg via INTRAVENOUS

## 2011-02-26 MED ORDER — MIDAZOLAM HCL 5 MG/5ML IJ SOLN
INTRAMUSCULAR | Status: DC | PRN
  Administered 2011-02-26: 2 mg via INTRAVENOUS

## 2011-02-26 MED ORDER — PHENYLEPHRINE HCL 10 MG/ML IJ SOLN
INTRAMUSCULAR | Status: DC | PRN
  Administered 2011-02-26 (×5): .04 mg via INTRAVENOUS

## 2011-02-26 MED ORDER — LACTATED RINGERS IV SOLN
INTRAVENOUS | Status: DC | PRN
  Administered 2011-02-26: 11:00:00 via INTRAVENOUS

## 2011-02-26 MED ORDER — ACETAMINOPHEN 325 MG PO TABS
650.0000 mg | ORAL_TABLET | ORAL | Status: DC | PRN
  Filled 2011-02-26: qty 2

## 2011-02-26 MED ORDER — SODIUM CHLORIDE 0.45 % IV SOLN
INTRAVENOUS | Status: DC
  Administered 2011-02-26: 10:00:00 via INTRAVENOUS

## 2011-02-26 MED ORDER — PANTOPRAZOLE SODIUM 40 MG PO TBEC
40.0000 mg | DELAYED_RELEASE_TABLET | Freq: Every day | ORAL | Status: DC
  Administered 2011-02-26: 40 mg via ORAL
  Filled 2011-02-26: qty 1

## 2011-02-26 MED ORDER — FENTANYL CITRATE 0.05 MG/ML IJ SOLN
INTRAMUSCULAR | Status: DC | PRN
  Administered 2011-02-26: 50 ug via INTRAVENOUS
  Administered 2011-02-26: 100 ug via INTRAVENOUS

## 2011-02-26 MED ORDER — SODIUM CHLORIDE 0.9 % IJ SOLN
3.0000 mL | INTRAMUSCULAR | Status: DC | PRN
  Administered 2011-02-27: 3 mL via INTRAVENOUS

## 2011-02-26 MED ORDER — LISINOPRIL 20 MG PO TABS
20.0000 mg | ORAL_TABLET | Freq: Every day | ORAL | Status: DC
  Filled 2011-02-26: qty 1

## 2011-02-26 MED ORDER — PIOGLITAZONE HCL 15 MG PO TABS
15.0000 mg | ORAL_TABLET | Freq: Two times a day (BID) | ORAL | Status: DC
  Administered 2011-02-27: 15 mg via ORAL
  Filled 2011-02-26 (×3): qty 1

## 2011-02-26 MED ORDER — EPHEDRINE SULFATE 50 MG/ML IJ SOLN
INTRAMUSCULAR | Status: DC | PRN
  Administered 2011-02-26 (×2): 5 mg via INTRAVENOUS

## 2011-02-26 MED ORDER — LACTATED RINGERS IV SOLN: INTRAVENOUS | Status: DC

## 2011-02-26 MED ORDER — SODIUM CHLORIDE 0.9 % IV SOLN: 250.0000 mL | INTRAVENOUS | Status: AC

## 2011-02-26 MED ORDER — MIDAZOLAM HCL 2 MG/2ML IJ SOLN: 0.5000 mg | Freq: Once | INTRAMUSCULAR | Status: DC | PRN

## 2011-02-26 MED ORDER — ONDANSETRON HCL 4 MG/2ML IJ SOLN
INTRAMUSCULAR | Status: DC | PRN
  Administered 2011-02-26: 4 mg via INTRAVENOUS

## 2011-02-26 MED ORDER — LISINOPRIL-HYDROCHLOROTHIAZIDE 20-25 MG PO TABS: 1.0000 | ORAL_TABLET | Freq: Every day | ORAL | Status: DC

## 2011-02-26 MED ORDER — HYDROCHLOROTHIAZIDE 25 MG PO TABS
25.0000 mg | ORAL_TABLET | Freq: Every day | ORAL | Status: DC
  Filled 2011-02-26: qty 1

## 2011-02-26 MED ORDER — METFORMIN HCL 850 MG PO TABS
850.0000 mg | ORAL_TABLET | Freq: Two times a day (BID) | ORAL | Status: DC
  Administered 2011-02-27: 850 mg via ORAL
  Filled 2011-02-26 (×3): qty 1

## 2011-02-26 MED ORDER — PROPOFOL 10 MG/ML IV EMUL
INTRAVENOUS | Status: DC | PRN
  Administered 2011-02-26: 320 mg via INTRAVENOUS

## 2011-02-26 MED ORDER — DILTIAZEM HCL ER COATED BEADS 180 MG PO CP24
180.0000 mg | ORAL_CAPSULE | Freq: Every day | ORAL | Status: DC
  Filled 2011-02-26: qty 1

## 2011-02-26 MED ORDER — MEPERIDINE HCL 25 MG/ML IJ SOLN: 6.2500 mg | INTRAMUSCULAR | Status: DC | PRN

## 2011-02-26 NOTE — Anesthesia Preprocedure Evaluation (Addendum)
Anesthesia Evaluation  Patient identified by MRN, date of birth, ID band Patient awake    Reviewed: Allergy & Precautions, H&P , NPO status , Patient's Chart, lab work & pertinent test results  Airway Mallampati: III TM Distance: >3 FB Neck ROM: Full    Dental  (+) Teeth Intact, Caps and Dental Advisory Given   Pulmonary          Cardiovascular hypertension, Pt. on medications Regular Normal H/o aflutter   Neuro/Psych    GI/Hepatic GERD-  Medicated and Controlled,  Endo/Other  Diabetes mellitus-, Well Controlled, Type 2, Oral Hypoglycemic AgentsMorbid obesity  Renal/GU      Musculoskeletal   Abdominal (+) obese,   Peds  Hematology   Anesthesia Other Findings   Reproductive/Obstetrics                          Anesthesia Physical Anesthesia Plan  ASA: III  Anesthesia Plan: General   Post-op Pain Management:    Induction: Cricoid pressure planned and Intravenous  Airway Management Planned: Oral ETT  Additional Equipment:   Intra-op Plan:   Post-operative Plan: Possible Post-op intubation/ventilation  Informed Consent: I have reviewed the patients History and Physical, chart, labs and discussed the procedure including the risks, benefits and alternatives for the proposed anesthesia with the patient or authorized representative who has indicated his/her understanding and acceptance.   Dental advisory given  Plan Discussed with: Anesthesiologist  Anesthesia Plan Comments:        Anesthesia Quick Evaluation

## 2011-02-26 NOTE — Anesthesia Postprocedure Evaluation (Signed)
  Anesthesia Post-op Note  Patient: Ryan Jones  Procedure(s) Performed:  ATRIAL FLUTTER ABLATION  Patient Location: PACU  Anesthesia Type: General  Level of Consciousness: awake  Airway and Oxygen Therapy: Patient Spontanous Breathing  Post-op Pain: mild  Post-op Assessment: Post-op Vital signs reviewed  Post-op Vital Signs: stable  Complications: No apparent anesthesia complications

## 2011-02-26 NOTE — Transfer of Care (Signed)
Immediate Anesthesia Transfer of Care Note  Patient: Ryan Jones  Procedure(s) Performed:  ATRIAL FLUTTER ABLATION  Patient Location: PACU and Cath Lab  Anesthesia Type: General  Level of Consciousness: awake, alert  and patient cooperative  Airway & Oxygen Therapy: Patient Spontanous Breathing and Patient connected to nasal cannula oxygen  Post-op Assessment: Report given to PACU RN  Post vital signs: Reviewed  Complications: No apparent anesthesia complications

## 2011-02-26 NOTE — Op Note (Signed)
NAMEJONATHA, Ryan Jones              ACCOUNT NO.:  [id number]  MEDICAL RECORD NO.:  [id number]  LOCATION:  [facility]                         FACILITY:  [facility]  PHYSICIAN:  Duke Salvia, MD, FACCDATE OF BIRTH:  Jul 26, 1955  DATE OF PROCEDURE:  02/26/2011 DATE OF DISCHARGE:                              OPERATIVE REPORT   PREOPERATIVE DIAGNOSIS:  Atrial flutter.  POSTOPERATIVE DIAGNOSIS:  Atrial flutter.  PROCEDURE:  Invasive electrophysiological study with catheter ablation.  Following obtaining informed consent, the patient was brought to the electrophysiology laboratory and placed on the fluoroscopic table in a supine position.  After routine prep and drape, cardiac catheterization was performed under local anesthesia.  The patient was given general anesthesia under the care of Dr. Randa Evens and Dr. Noreene Larsson.  Catheters of 5-French quadripolar catheter was inserted via left femoral vein to the AV junction.  A 6-French quadripolar catheter was inserted to the right femoral vein to the coronary sinus.  A 7-French dual decapolar catheter was inserted via left femoral vein to the tricuspid anulus.  An 8-French 8 mm deflectable tip catheter was inserted via the right femoral vein initially via SAFL sheath and then VNSL 2 sheath to mapping sites in the posterior septal space.  Surface leads 1, aVF, and V1 were monitored continuously throughout the procedure.  Following insertion of the catheters, a stimulation protocol included, Incremental atrial pacing. Incremental ventricular pacing. Single atrial extrastimuli paced cycle length of 600 msec.  End-Tidal Results: End-tidal surface electrocardiogram. Rhythm: Sinus; RR interval 906 msec; PR interval 151 msec; P-wave duration 95 msec; QRS duration 98 msec; QT interval 398 msec; AH interval 85 msec; HV interval 41 msec. Final: Sinus; RR interval 884 msec; PR interval 142 msec; QRS duration 92 msec; QT interval 371 msec; NAH and HV  interval were not measurable, catheter too quickly inadvertently.  AV nodal function: AV Wenckebach was 450 msec. VA Wenckebach was 300 msec. AV nodal effective refractory period of 600 msec was 4 msec and no evidence of dual AV nodal physiology was seen.  Accessory pathway function:  No evidence of accessory pathway was identified.  Ventricular response to programmed stimulation.  Ventricular stimulation was normal as described above.  Arrhythmias induced.  The patient with a prior history of atrial flutter was ablated empirically across the cavotricuspid isthmus.  A total of 37 minutes and 45 seconds of RF energy was applied on multiple lines. Ultimately block was created with a 7 o'clock lined following the changing of the sheaths as noted above.  The A1 and A2 interval was about 150 msec.  Programmed total of 30.6 minutes of fluoroscopy time was utilized at 7.5 to 15 frames per second.  IMPRESSION: 1. Normal sinus function. 2. Abnormal atrial function manifested by sustained atrial flutter. 3. Normal AV nodal function. 4. No accessory pathway 5. Normal His Purkinje system function. 6. Normal ventricular response to programmed stimulation.  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  Cavotricuspid isthmus conduction was interrupted bidirectionally with radiofrequency energy as noted.  The patient tolerated the procedure without apparent complications.  The sheaths were removed.  The patient  was extubated and transferred to the PACU in a stable condition.  The patient had transient hypotension requiring vasopressor support. Fluoroscopy during these times demonstrated normal fluoroscopic movement of the pericardial edge.  The patient tolerated the procedure well otherwise without apparent complication.     Duke Salvia, MD,  [facility]     SCK/MEDQ  D:  02/26/2011  T:  02/26/2011  Job:  [id number]

## 2011-02-26 NOTE — Preoperative (Signed)
Beta Blockers   Reason not to administer Beta Blockers:Not Applicable 

## 2011-02-26 NOTE — Op Note (Signed)
Dictation [id number] klein

## 2011-02-26 NOTE — H&P (Signed)
Date of Initial H&P: 02/02/2011  History reviewed, patient examined, no change in status, stable for procedure.  Risks reviewed  These include death, perofration stroke, and pacemaker he understands agrees and is willing to proceed  Benefits reivewed 90-95% of successsful ablation

## 2011-02-26 NOTE — Brief Op Note (Signed)
02/26/2011  2:06 PM  PATIENT:  Ryan Jones  55 y.o. male  PRE-OPERATIVE DIAGNOSIS:  A Flutter  POST-OPERATIVE DIAGNOSIS:  * No post-op diagnosis entered *  PROCEDURE:  Procedure(s): ATRIAL FLUTTER ABLATION  SURGEON:  Surgeon(s): Duke Salvia, MD  PHYSICIAN ASSISTANT:   ASSISTANTS:    ANESTHESIA:   general  EBL:     BLOOD ADMINISTERED:none  DRAINS: none   LOCAL MEDICATIONS USED:  BUPIVICAINE 30CC  SPECIMEN:  No Specimen  DISPOSITION OF SPECIMEN:  N/A  COUNTS:  NO  non invasive  TOURNIQUET:  * No tourniquets in log *  DICTATION: .  PLAN OF CARE: Admit for overnight observation  PATIENT DISPOSITION:  PACU - hemodynamically stable.   Delay start of Pharmacological VTE agent (>24hrs) due to surgical blood loss or risk of bleeding:  {YES/NO/NOT APPLICABLE:20182

## 2011-02-27 ENCOUNTER — Other Ambulatory Visit: Payer: Self-pay

## 2011-02-27 LAB — GLUCOSE, CAPILLARY: Glucose-Capillary: 137 mg/dL — ABNORMAL HIGH (ref 70–99)

## 2011-02-27 NOTE — Progress Notes (Signed)
Electrophysiology Rounding Note  Patient Name: Ryan Jones      SUBJECTIVE:without chest pain    PHYSICAL EXAM Blood pressure 150/74, pulse 87, temperature 98.8 F (37.1 C), temperature source Oral, resp. rate 20, height 5\' 9"  (1.753 m), weight 163.8 kg (361 lb 1.8 oz), SpO2 95.00%. General appearance: alert, cooperative and morbidly obese Lungs: clear to auscultation bilaterally Heart: regular rate and rhythm, S1, S2 normal, no murmur, click, rub or gallop Extremities: extremities normal, atraumatic, no cyanosis or edema and groins ok Pulses: 2+ and symmetric Skin: Skin color, texture, turgor normal. No rashes or lesions     TELEMETRY: Reviewed telemetry pt in  sinus:    Intake/Output Summary (Last 24 hours) at 02/27/11 0857 Last data filed at 02/27/11 0600  Gross per 24 hour  Intake   2110 ml  Output    725 ml  Net   1385 ml    LABS: Basic Metabolic Panel: No results found for this basename: NA:2,K:2,CL:2,CO2:2,GLUCOSE:2,BUN:2,CREATININE:2,CALCIUM:2,MG:2,PHOS:2 in the last 72 hours Liver Function Tests: No results found for this basename: AST:2,ALT:2,ALKPHOS:2,BILITOT:2,PROT:2,ALBUMIN:2 in the last 72 hours No results found for this basename: LIPASE:2,AMYLASE:2 in the last 72 hours CBC: No results found for this basename: WBC:2,NEUTROABS:2,HGB:2,HCT:2,MCV:2,PLT:2 in the last 72 hours Cardiac Enzymes: No results found for this basename: CKTOTAL:3,CKMB:3,CKMBINDEX:3,TROPONINI:3 in the last 72 hours BNP: No results found for this basename: POCBNP:3 in the last 72 hours D-Dimer: No results found for this basename: DDIMER:2 in the last 72 hours Hemoglobin A1C: No results found for this basename: HGBA1C in the last 72 hours Fasting Lipid Panel: No results found for this basename: CHOL,HDL,LDLCALC,TRIG,CHOLHDL,LDLDIRECT in the last 72 hours Thyroid Function Tests: No results found for this basename: TSH,T4TOTAL,FREET3,T3FREE,THYROIDAB in the last 72 hours Anemia  Panel: No results found for this basename: VITAMINB12,FOLATE,FERRITIN,TIBC,IRON,RETICCTPCT in the last 72 hours  Radiology/Studies:  No results found.      ASSESSMENT AND PLAN:   Patient Active Hospital Problem List: Atrial flutter (09/12/2009)   Assessment: s/p ablation    Plan: asa  D/dc diltiazem Instructions per protocol  Hypertension  Will defer uptitration of ACE to PCP and primary caridologist       Signed, Sherryl Manges MD  02/27/2011

## 2011-02-27 NOTE — Discharge Summary (Signed)
Discharge Summary:  Date of admission: 02-26-11 Date of discharge: 02-27-11  Primary Cardiologist: Olga Millers, MD Electrophysiologist: Sherryl Manges, MD  Primary Diagnosis: 1. Atrial Flutter status post ablation this admission  Secondary Diagnosis: 1. Hypertension 2. GERD 3. Diabetes Mellitus 4. Gout  Procedures this admission: 1.  Atrial flutter ablation on 02-26-11 by Dr. Graciela Husbands.  This study demonstrated cavotricuspid isthmus conduction as the likely mechanism of the patient's atrial flutter.  He underwent ablation of his cavotricuspid isthmus. He did have transient hypotension during the procedure requiring vasopressor support.  He had no early apparent complications.  Brief HPI: [name] is a 55 year old male with a history of atrial flutter, hypertension, and GERD.  He originally presented with atrial flutter in May of 2011 and underwent cardioversion at that time.  At that time, a period of watchful waiting was recommended, however, he had recurrent atrial flutter in October of this year and was referred to Dr Graciela Husbands for treatment options.  Options for management of atrial flutter were discussed with the patient including catheter ablation.  Risks, benefits, and alternatives to ablation were reviewed with the patient and he wished to proceed.    Hospital Course:  The patient was admitted on 02-26-11 for planned ablation of atrial flutter.  This was carried out by Dr. Graciela Husbands with details as outlined above.  He was monitored on telemetry overnight which demonstrated sinus rhythm.  His groin incision was without hematoma or bruit.  Dr. Graciela Husbands examined the patient on 02-27-11 and considered him stable for discharge.  Discharge Medications:  Current Discharge Medication List    CONTINUE these medications which have NOT CHANGED   Details  aspirin EC 81 MG tablet Take 81 mg by mouth daily.      glipiZIDE (GLUCOTROL) 5 MG tablet Take 5 mg by mouth 2 (two) times daily.     indomethacin  (INDOCIN) 25 MG capsule Take 25 mg by mouth daily as needed. For gout.    lisinopril-hydrochlorothiazide (PRINZIDE,ZESTORETIC) 20-25 MG per tablet Take 1 tablet by mouth daily.      Multiple Vitamin (MULTIVITAMIN) capsule Take 1 capsule by mouth daily.      omeprazole (PRILOSEC) 20 MG capsule Take 20 mg by mouth daily.      pioglitazone-metformin (ACTOPLUS MET) 15-850 MG per tablet Take 1 tablet by mouth 2 (two) times daily with a meal.      albuterol (PROVENTIL HFA;VENTOLIN HFA) 108 (90 BASE) MCG/ACT inhaler Inhale 2 puffs into the lungs every 6 (six) hours as needed. For shortness of breath.       STOP taking these medications     diltiazem (CARDIZEM CD) 180 MG 24 hr capsule         Disposition:  Discharge Orders    Future Appointments: Provider: Department: Dept Phone: Center:   04/03/2011 10:30 AM Duke Salvia, MD [facility] [phone] [facility]     Future Orders Please Complete By Expires   Diet - low sodium heart healthy      Increase activity slowly      Driving Restrictions      Comments:   No driving for 2 days   Discharge wound care:      Comments:   Keep procedure site clean & dry. If you notice increased pain, swelling, bleeding or pus, call/return!     Follow-up Information    Follow up with Sherryl Manges, MD. (04/03/11 at 10:30am)    Contact information:   [address] [phone]  [address], [address] [place] [address] [phone]          Duration of Discharge Encounter: Greater than 30 minutes including physician time.  Signed, Gypsy Balsam, RN, BSN 02/27/2011, 9:19 AM

## 2011-03-08 NOTE — Discharge Summary (Signed)
S/p ablation aflutter

## 2011-04-03 ENCOUNTER — Ambulatory Visit (INDEPENDENT_AMBULATORY_CARE_PROVIDER_SITE_OTHER): Payer: Federal, State, Local not specified - PPO | Admitting: Internal Medicine

## 2011-04-03 ENCOUNTER — Encounter: Payer: Self-pay | Admitting: Internal Medicine

## 2011-04-03 VITALS — BP 136/85 | HR 65 | Ht 69.0 in | Wt 370.0 lb

## 2011-04-03 DIAGNOSIS — E669 Obesity, unspecified: Secondary | ICD-10-CM

## 2011-04-03 DIAGNOSIS — I10 Essential (primary) hypertension: Secondary | ICD-10-CM

## 2011-04-03 DIAGNOSIS — I4892 Unspecified atrial flutter: Secondary | ICD-10-CM

## 2011-04-03 NOTE — Patient Instructions (Signed)
Your physician recommends that you continue on your current medications as directed. Please refer to the Current Medication list given to you today.  We will see you back on an as needed basis. 

## 2011-04-03 NOTE — Assessment & Plan Note (Signed)
We spent a good deal of time talking about diet and exercise and the importance of this. We have cough with a target of 2-3 pounds a month which is a total calorie reduction of about 11,000 calories or 350 calories a day. He thinks he can do this.

## 2011-04-03 NOTE — Assessment & Plan Note (Signed)
Status post catheter ablation. Statistically he has about a 30% chance of developing atrial fibrillation. He is advised to keep an eye on that as he has multiple risk factors for thromboembolism.

## 2011-04-03 NOTE — Progress Notes (Signed)
  HPI  Ryan Jones is a 55 y.o. male Seen in followup for atrial flutter status post catheter ablation November 2012. He's had no recurrent arrhythmia.  He works nights. He denies working time somnolence. His wife is not say that he snores. He is under brachial distress. He struggles with his weight.  Past Medical History  Diagnosis Date  . Hypertension   . GERD (gastroesophageal reflux disease)   . Gout     "get it in my hands and feet"  . Atrial flutter 02/26/11    "atrial  flutter"  . Seasonal allergies 02/26/11     "I take an allergy shot q week"  . Bronchitis     h/o recurrent bronchitis; "usually get it 1-2X/wy; last time 12/2010"  . Diabetes mellitus     "pills"  . Arthritis   . Gout   . 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 Constipation   . Diarrhea   . Hemorrhoids     Past Surgical History  Procedure Date  . Back surgery before 1995 and in 1995;     "ruptured disc repaired; lower back"  . Lumbar disc surgery ~ 1993    "put foam in back; in place of disc"  . Lumbar disc surgery 04/1993    "replaced gel foam that had blown out"  . Cardiac electrophysiology study and ablation 02/26/11  . Skin cancer excision 2011    right ear    Current Outpatient Prescriptions  Medication Sig Dispense Refill  . albuterol (PROVENTIL HFA;VENTOLIN HFA) 108 (90 BASE) MCG/ACT inhaler Inhale 2 puffs into the lungs every 6 (six) hours as needed. For shortness of breath.       Marland Kitchen aspirin EC 81 MG tablet Take 81 mg by mouth daily.        Marland Kitchen glipiZIDE (GLUCOTROL) 5 MG tablet Take 5 mg by mouth as needed.       . indomethacin (INDOCIN) 25 MG capsule Take 25 mg by mouth daily as needed. For gout.      Marland Kitchen lisinopril-hydrochlorothiazide (PRINZIDE,ZESTORETIC) 20-25 MG per tablet Take 1 tablet by mouth daily.        . Multiple Vitamin (MULTIVITAMIN) capsule Take 1 capsule by mouth daily.        Marland Kitchen omeprazole (PRILOSEC) 20 MG capsule Take 20 mg by mouth daily.        . pioglitazone-metformin (ACTOPLUS  MET) 15-850 MG per tablet Take 1 tablet by mouth 2 (two) times daily with a meal.          Allergies  Allergen Reactions  . Codeine Itching    Review of Systems negative except from HPI and PMH  Physical Exam Well developed and well nourished morbidly obesein no acute distress HENT normal E scleral and icterus clear Neck Supple JVP flat; carotids brisk and full Clear to ausculation Regular rate and rhythm, no murmurs gallops or rub Soft with active bowel sounds No clubbing cyanosis none  Edema Alert and oriented, grossly normal motor and sensory function Skin Warm and Dry  Electro-cardiogram dated today demonstrates sinus rhythm at 65 Intervals 0.15/0.09/24 zero ECG is otherwise normal  Assessment and  Plan

## 2011-04-03 NOTE — Assessment & Plan Note (Signed)
Reasonably controlled 

## 2011-05-25 ENCOUNTER — Ambulatory Visit: Payer: Federal, State, Local not specified - PPO | Admitting: Physician Assistant

## 2011-05-25 ENCOUNTER — Ambulatory Visit (INDEPENDENT_AMBULATORY_CARE_PROVIDER_SITE_OTHER): Payer: Federal, State, Local not specified - PPO | Admitting: Physician Assistant

## 2011-05-25 ENCOUNTER — Encounter: Payer: Self-pay | Admitting: Physician Assistant

## 2011-05-25 VITALS — BP 130/90 | HR 62 | Ht 69.0 in | Wt 373.0 lb

## 2011-05-25 DIAGNOSIS — R079 Chest pain, unspecified: Secondary | ICD-10-CM

## 2011-05-25 DIAGNOSIS — I4892 Unspecified atrial flutter: Secondary | ICD-10-CM

## 2011-05-25 DIAGNOSIS — R0789 Other chest pain: Secondary | ICD-10-CM

## 2011-05-25 DIAGNOSIS — I10 Essential (primary) hypertension: Secondary | ICD-10-CM

## 2011-05-25 NOTE — Progress Notes (Signed)
[address] [place], [place]  [address] Phone: [phone] Fax:  [phone]  Date:  05/25/2011   Name:  Ryan Jones       DOB:  [birth year] MRN:  [id number]  PCP:  Dr. Jackalyn Lombard Primary Cardiologist:  Dr. Olga Millers  Primary Electrophysiologist:  Dr. Sherryl Manges    History of Present Illness: Ryan Jones is a 56 y.o. male who presents for follow up on BP.   He has a h/o of hypertension, DM2, GERD, gout and prior atrial flutter. Prior Myoview in April of 2007 showed an ejection fraction of 63% and normal perfusion. In May of 2011the patient had an episode of atrial flutter.  He ultimately underwent cardioversion.  Patient was seen in followup by Dr. Ladona Ridgel and a period of watchful waiting was recommended and not ablation. There was concern that the patient may develop atrial fibrillation in the future given his obesity. Recently seen by Dr. Olga Millers and noted to be in AFlutter.  Echo 10/12: mild LVH, EF 55-60%, mod to severe LAE, mild RVE, mild RAE, PASP 35.  He was seen by Dr. Sherryl Manges and underwent RFCA of AFlutter in 02/2011.   Last seen in this office 04/03/11 by Dr. Sherryl Manges.    He was having headaches that prompted him to check his BP.   He brings in a list of his blood pressures over the last several weeks and they range from 183/103-137/76.  He saw his new PCP last week and she increased his Lisinopril to 30/25 mg QD.  Since then he feels better.  No further headaches.  He has an occasional atypical chest pain.  No exertional chest pain.  No dyspnea.  No orthopnea, PND or edema.  No syncope.  No palpitations.  He wants to start exercising.  He would like to get a treadmill.    Past Medical History  Diagnosis Date  . Hypertension   . GERD (gastroesophageal reflux disease)   . Gout     "get it in my hands and feet"  . Atrial flutter 02/26/11    "atrial  flutter"  . Seasonal allergies 02/26/11     "I take an allergy shot q  week"  . Bronchitis     h/o recurrent bronchitis; "usually get it 1-2X/wy; last time 12/2010"  . Diabetes mellitus     "pills"  . Arthritis   . Gout   . 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 Constipation   . Diarrhea   . Hemorrhoids     Current Outpatient Prescriptions  Medication Sig Dispense Refill  . albuterol (PROVENTIL HFA;VENTOLIN HFA) 108 (90 BASE) MCG/ACT inhaler Inhale 2 puffs into the lungs every 6 (six) hours as needed. For shortness of breath.       Marland Kitchen aspirin EC 81 MG tablet Take 81 mg by mouth daily.        Marland Kitchen glipiZIDE (GLUCOTROL) 5 MG tablet Take 5 mg by mouth as needed.       . indomethacin (INDOCIN) 25 MG capsule Take 25 mg by mouth daily as needed. For gout.      Marland Kitchen lisinopril-hydrochlorothiazide (PRINZIDE,ZESTORETIC) 20-25 MG per tablet Take 1 tablet by mouth daily.        . Multiple Vitamin (MULTIVITAMIN) capsule Take 1 capsule by mouth daily.        Marland Kitchen omeprazole (PRILOSEC) 20 MG capsule Take 20 mg by mouth daily.        Marland Kitchen  pioglitazone-metformin (ACTOPLUS MET) 15-850 MG per tablet Take 1 tablet by mouth 2 (two) times daily with a meal.          Allergies: Allergies  Allergen Reactions  . Codeine Itching    History  Substance Use Topics  . Smoking status: Former Smoker    Types: Cigars  . Smokeless tobacco: Current User    Types: Snuff   Comment: "stopped cigars ~ 2002"  . Alcohol Use: No     ROS:  Please see the history of present illness.   All other systems reviewed and negative.   PHYSICAL EXAM: VS:  BP 130/90  Pulse 62  Ht 5\' 9"  (1.753 m)  Wt 373 lb (169.192 kg)  BMI 55.08 kg/m2 Repeat BP by me on left: 130/80  Well nourished, well developed, in no acute distress HEENT: normal Neck: no JVD Cardiac:  normal S1, S2; RRR; no murmur Lungs:  clear to auscultation bilaterally, no wheezing, rhonchi or rales Abd: soft, nontender, no hepatomegaly Ext: no edema Skin: warm and dry Neuro:  CNs 2-12 intact, no focal abnormalities noted  EKG:  NSR with sinus  arrhythmia, normal axis, NSSTTW changes, HR 65  ASSESSMENT AND PLAN:

## 2011-05-25 NOTE — Assessment & Plan Note (Signed)
As noted, plan ETT as he wants to increase activity and start exercising.  I can go over a plan for exercise at his test.

## 2011-05-25 NOTE — Patient Instructions (Signed)
Your physician has requested that you have an exercise tolerance test. For further information please visit [email or web address] Please also follow instruction sheet, as given.WITH SC[facility] WEAVER OR DR CRENSHAW  CHECK BP AT HOME CALL IF BP GREATER THAN 130/80

## 2011-05-25 NOTE — Assessment & Plan Note (Signed)
Maintaining NSR.  Unable to tolerate ASA due.  He is to see Dr. Sherryl Manges prn.

## 2011-05-25 NOTE — Assessment & Plan Note (Signed)
BP better since increasing ACE.  He has follow up with his PCP.  I suggest he continue this dose for now.  He will keep a check on his BP at home.  If BP remains above 130/80, he will call us.  At that point, I would increase to Lisinopril/HCT 40/25 mg QD.  Of note, he was taking Indomethacin and advil for DJD prior to his BP going up.  His prior PCP had put him on Celebrex, but he could not tolerate this.  He does admit to dietary indiscretion with salt.  We discussed limiting his salt today and to limit NSAID use.  He would like to start exercising.  He has some atypical CP.  It has been 5 years since his last stress test.  He has a CAD risk equivalent of DM2.  I will have him undergo a plain ETT with me or Dr. Olga Millers.

## 2011-06-05 ENCOUNTER — Telehealth: Payer: Self-pay | Admitting: Internal Medicine

## 2011-06-05 ENCOUNTER — Telehealth: Payer: Self-pay | Admitting: Cardiology

## 2011-06-05 MED ORDER — LISINOPRIL 20 MG PO TABS: 20.0000 mg | ORAL_TABLET | Freq: Every day | ORAL | Status: DC

## 2011-06-05 MED ORDER — LISINOPRIL-HYDROCHLOROTHIAZIDE 20-25 MG PO TABS: 1.0000 | ORAL_TABLET | Freq: Every day | ORAL | Status: DC

## 2011-06-05 NOTE — Telephone Encounter (Signed)
Spoke to [employer], questions answered

## 2011-06-05 NOTE — Telephone Encounter (Signed)
Spoke with pt, he reports his bp is consistently staying elevated. He works second shift and when he gets up in the afternoon his bp is 155/98 and yesterday it was 165/96. He is concerned. He is currently taking lisinopril 30 mg and HCTZ 25 mg. He was last seen by scott weaver pac. Will ask scott for recommendations.

## 2011-06-05 NOTE — Telephone Encounter (Signed)
New msg: Pharmacy calling wanting to speak with nurse regarding HCTZ and lisinopril and then a separate lisinopril. Pharmacy calling wanting to know if pt needs both lisinopril RX's called in. Please return pharmacy call to discuss further.

## 2011-06-05 NOTE — Telephone Encounter (Signed)
Discussed bp issues with scott weaver pac, pt will increase to lisinopril 40 mg and HCTZ 25 mg. He will cont to track his bp.

## 2011-06-05 NOTE — Telephone Encounter (Signed)
New Msg:Pt calling c/o of uncontrolled HTN. Pt BP this AM was 155/98. Yesterday AM pt BP was 165/96. Pt would like discuss this with MD. Please return pt call to discuss further.

## 2011-06-11 ENCOUNTER — Encounter (HOSPITAL_COMMUNITY): Payer: Self-pay

## 2011-06-11 ENCOUNTER — Emergency Department (HOSPITAL_COMMUNITY)
Admission: EM | Admit: 2011-06-11 | Discharge: 2011-06-11 | Disposition: A | Discharge status: Home Health | Payer: Federal, State, Local not specified - PPO | Attending: Emergency Medicine | Admitting: Emergency Medicine

## 2011-06-11 ENCOUNTER — Other Ambulatory Visit: Payer: Self-pay

## 2011-06-11 ENCOUNTER — Emergency Department (HOSPITAL_COMMUNITY): Payer: Federal, State, Local not specified - PPO

## 2011-06-11 DIAGNOSIS — E669 Obesity, unspecified: Secondary | ICD-10-CM | POA: Insufficient documentation

## 2011-06-11 DIAGNOSIS — M129 Arthropathy, unspecified: Secondary | ICD-10-CM | POA: Insufficient documentation

## 2011-06-11 DIAGNOSIS — Z79899 Other long term (current) drug therapy: Secondary | ICD-10-CM | POA: Insufficient documentation

## 2011-06-11 DIAGNOSIS — K219 Gastro-esophageal reflux disease without esophagitis: Secondary | ICD-10-CM | POA: Insufficient documentation

## 2011-06-11 DIAGNOSIS — R0602 Shortness of breath: Secondary | ICD-10-CM | POA: Insufficient documentation

## 2011-06-11 DIAGNOSIS — Z862 Personal history of diseases of the blood and blood-forming organs and certain disorders involving the immune mechanism: Secondary | ICD-10-CM | POA: Insufficient documentation

## 2011-06-11 DIAGNOSIS — E119 Type 2 diabetes mellitus without complications: Secondary | ICD-10-CM | POA: Insufficient documentation

## 2011-06-11 DIAGNOSIS — Z8639 Personal history of other endocrine, nutritional and metabolic disease: Secondary | ICD-10-CM | POA: Insufficient documentation

## 2011-06-11 DIAGNOSIS — I10 Essential (primary) hypertension: Secondary | ICD-10-CM

## 2011-06-11 LAB — POCT I-STAT TROPONIN I: Troponin i, poc: 0 ng/mL (ref 0.00–0.08)

## 2011-06-11 NOTE — ED Notes (Addendum)
Pt experiencing problems w/ fluctuating blood pressure x 1 month. Also assoc with increased sob. See triage note but pt was asked to come to dept by pcp. Pt denies cp or other discomfort. Pt has appt on Monday for stress test at [facility]. Pt has significant medical hx including aflutter with corrective ablasion last year, diabetes, and bronchitis. Pt's primary concern is that bp is not being adequately controlled by medications. Pt in no acute distress, will cont to monitor.

## 2011-06-11 NOTE — ED Provider Notes (Signed)
Medical screening examination/treatment/procedure(s) were performed by non-physician practitioner and as supervising physician I was immediately available for consultation/collaboration.  Cassara Nida P Alonie Gazzola, MD 06/11/11 1625 

## 2011-06-11 NOTE — Discharge Instructions (Signed)
Hypertension Information As your heart beats, it forces blood through your arteries. This force is your blood pressure. If the pressure is too high, it is called hypertension (HTN) or high blood pressure. HTN is dangerous because you may have it and not know it. High blood pressure may mean that your heart has to work harder to pump blood. Your arteries may be narrow or stiff. The extra work puts you at risk for heart disease, stroke, and other problems.  Blood pressure consists of two numbers, a higher number over a lower, 110/72, for example. It is stated as "110 over 72." The ideal is below 120 for the top number (systolic) and under 80 for the bottom (diastolic).  You should pay close attention to your blood pressure if you have certain conditions such as:  Heart failure.   Prior heart attack.   Diabetes   Chronic kidney disease.   Prior stroke.   Multiple risk factors for heart disease.  To see if you have HTN, your blood pressure should be measured while you are seated with your arm held at the level of the heart. It should be measured at least twice. A one-time elevated blood pressure reading (especially in the Emergency Department) does not mean that you need treatment. There may be conditions in which the blood pressure is different between your right and left arms. It is important to see your caregiver soon for a recheck. Most people have essential hypertension which means that there is not a specific cause. This type of high blood pressure may be lowered by changing lifestyle factors such as:  Stress.   Smoking.   Lack of exercise.   Excessive weight.   Drug/tobacco/alcohol use.   Eating less salt.  Most people do not have symptoms from high blood pressure until it has caused damage to the body. Effective treatment can often prevent, delay or reduce that damage. TREATMENT  Treatment for high blood pressure, when a cause has been identified, is directed at the cause. There  are a large number of medications to treat HTN. These fall into several categories, and your caregiver will help you select the medicines that are best for you. Medications may have side effects. You should review side effects with your caregiver. If your blood pressure stays high after you have made lifestyle changes or started on medicines,   Your medication(s) may need to be changed.   Other problems may need to be addressed.   Be certain you understand your prescriptions, and know how and when to take your medicine.   Be sure to follow up with your caregiver within the time frame advised (usually within two weeks) to have your blood pressure rechecked and to review your medications.   If you are taking more than one medicine to lower your blood pressure, make sure you know how and at what times they should be taken. Taking two medicines at the same time can result in blood pressure that is too low.  Document Released: 06/09/2005 Document Revised: 12/17/2010 Document Reviewed: 06/16/2007 ExitCare Patient Information 2012 [employer]. 

## 2011-06-11 NOTE — ED Notes (Signed)
Pt was having some issues with his blood pressure running high earlier in the week. He went to pcp office earlier and was asked to check his blood pressure this morning after getting off work. He took his pressure this morning and it was running in the 200's. Pt took his regular meds for blood pressure pta and now his blood pressure has normalized. Pt is scheduled for treadmill test on Monday via [facility]. Pt states that he has been feeling slightly short of breath.

## 2011-06-11 NOTE — ED Provider Notes (Signed)
Medical screening examination/treatment/procedure(s) were performed by non-physician practitioner and as supervising physician I was immediately available for consultation/collaboration.  Nicholes Stairs, MD 06/11/11 [phone]

## 2011-06-11 NOTE — ED Notes (Signed)
Returned from xray

## 2011-06-11 NOTE — ED Notes (Signed)
Patient transported to X-ray 

## 2011-06-11 NOTE — ED Provider Notes (Signed)
Lab and radiology results reviewed and discussed with patient.  Patient is scheduled for treadmill stress test on Monday.  Patient is currently at maximum dose of lisinopril.  Blood pressure has normalized at present.  Patient to follow-up with PCP for continued monitoring of blood pressure control.  Jimmye Norman, NP 06/11/11 1610

## 2011-06-11 NOTE — ED Provider Notes (Signed)
History     CSN: [id number]  Arrival date & time 06/11/11  1306   First MD Initiated Contact with Patient 06/11/11 1354      Chief Complaint  Patient presents with  . Hypertension    (Consider location/radiation/quality/duration/timing/severity/associated sxs/prior treatment) HPI  56 year old male with history of hypertension, atrial flutter, and diabetes presenting to the ED with a chief complaints of shortness of breath. Patient states he has been having intermittent shortness of breath worsening with exertion for the past several days. He does endorse a dry cough but states he takes lisinopril which may contribute to it. He denies calf pain or leg swelling. He denies fever, headache, or sore throat.  Patient also mentioned that he noticed his blood pressure has been fluctuating for the past several weeks. He went to his primary Office early yesterday and the nurse has recommended for him to check his blood pressure this a.m. Patient states he checked it 3 times each time his systolic blood pressure was above 200. At which time he denies chest pain, shortness of breath, radiating pain, chest pressure, diaphoresis, or nausea. He was instructed by the nurse to come to the ER. Patient states he is scheduled for his treadmill test on Monday via [facility].  Patient also mentioned that he has a history of atrial fibrillation, and had a cardiac ablation procedure performed in November of last year.  Past Medical History  Diagnosis Date  . Hypertension   . GERD (gastroesophageal reflux disease)   . Gout     "get it in my hands and feet"  . Atrial flutter 02/26/11    s/p RFCA 02/2011  . Seasonal allergies 02/26/11     "I take an allergy shot q week"  . Bronchitis     h/o recurrent bronchitis; "usually get it 1-2X/wy; last time 12/2010"  . Diabetes mellitus     "pills"  . Arthritis   . Gout   . 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 Constipation   . Diarrhea   . Hemorrhoids     Past  Surgical History  Procedure Date  . Back surgery before 1995 and in 1995;     "ruptured disc repaired; lower back"  . Lumbar disc surgery ~ 1993    "put foam in back; in place of disc"  . Lumbar disc surgery 04/1993    "replaced gel foam that had blown out"  . Cardiac electrophysiology study and ablation 02/26/11  . Skin cancer excision 2011    right ear    History reviewed. No pertinent family history.  History  Substance Use Topics  . Smoking status: Never Smoker   . Smokeless tobacco: Current User    Types: Snuff   Comment: "stopped cigars ~ 2002"  . Alcohol Use: No      Review of Systems  All other systems reviewed and are negative.    Allergies  Codeine  Home Medications   Current Outpatient Rx  Name Route Sig Dispense Refill  . ALBUTEROL SULFATE HFA 108 (90 BASE) MCG/ACT IN AERS Inhalation Inhale 2 puffs into the lungs every 6 (six) hours as needed. For shortness of breath.     Marland Kitchen GLIPIZIDE ER 5 MG PO TB24 Oral Take 5 mg by mouth 2 (two) times daily.    . INDOMETHACIN 25 MG PO CAPS Oral Take 25 mg by mouth 3 (three) times daily as needed. For gout.    Marland Kitchen LISINOPRIL 20 MG PO TABS Oral Take 20  mg by mouth daily. Take with lisinopril/ hctz    . LISINOPRIL-HYDROCHLOROTHIAZIDE 20-25 MG PO TABS Oral Take 1 tablet by mouth daily. Take with lisinopril 20mg  tab    . MULTIVITAMINS PO CAPS Oral Take 1 capsule by mouth daily.      Marland Kitchen OMEPRAZOLE 20 MG PO CPDR Oral Take 40 mg by mouth daily.     Marland Kitchen PIOGLITAZONE HCL-METFORMIN HCL 15-850 MG PO TABS Oral Take 1 tablet by mouth 2 (two) times daily with a meal.        BP 135/74  Pulse 65  Temp(Src) 98.3 F (36.8 C) (Oral)  Resp 22  SpO2 97%  Physical Exam  Nursing note and vitals reviewed. Constitutional: He is oriented to person, place, and time. He appears well-developed and well-nourished.       Obese  HENT:  Head: Normocephalic and atraumatic.  Mouth/Throat: Oropharynx is clear and moist.  Eyes: Conjunctivae are normal.   Neck: Normal range of motion. Neck supple.  Cardiovascular: Normal rate and regular rhythm.   Pulmonary/Chest: Effort normal. No respiratory distress. He exhibits no tenderness.  Abdominal: Soft.  Musculoskeletal: Normal range of motion.  Neurological: He is alert and oriented to person, place, and time.  Skin: Skin is warm and dry.    ED Course  Procedures (including critical care time)  Labs Reviewed - No data to display No results found.   No diagnosis found.   Date: 06/11/2011  Rate: 61  Rhythm: normal sinus rhythm  QRS Axis: normal  Intervals: normal  ST/T Wave abnormalities: normal  Conduction Disutrbances:none  Narrative Interpretation:   Old EKG Reviewed: unchanged    MDM  Patient is he is to the ED due to the urging of his primary care office secondary to high blood pressure, obtained at home. Although he does complain of mild shortness of breath, he does not present with any other symptoms worrisome for an organ damage secondary to hypertension. His current blood pressure is 135/74. He is in no acute distress. He has a normal EKG   3:08 PM Discussed care with Felicie Morn, NP who agrees to continue monitoring. Plan is to discharge patient is is chest x-ray and troponin are negative. He is to followup with his prior schedule a treadmill test.  [facility] can be consulted if any changes.       Fayrene Helper, PA-C 06/11/11 1509  Fayrene Helper, PA-C 06/11/11 1510

## 2011-06-15 ENCOUNTER — Ambulatory Visit (INDEPENDENT_AMBULATORY_CARE_PROVIDER_SITE_OTHER): Payer: Federal, State, Local not specified - PPO | Admitting: Physician Assistant

## 2011-06-15 DIAGNOSIS — I10 Essential (primary) hypertension: Secondary | ICD-10-CM

## 2011-06-15 DIAGNOSIS — R079 Chest pain, unspecified: Secondary | ICD-10-CM

## 2011-06-15 NOTE — Progress Notes (Signed)
Exercise Treadmill Test  Pre-Exercise Testing Evaluation Rhythm: normal sinus  Rate: 75   PR:  .15 QRS:  .09  QT:  .37 QTc: .41     Test  Exercise Tolerance Test Ordering MD: Olga Millers, MD  Interpreting MD:  Tereso Newcomer PA-C  Unique Test No: 1   Treadmill:  1  Indication for ETT: HTN  Contraindication to ETT: No   Stress Modality: exercise - treadmill  Cardiac Imaging Performed: non   Protocol: standard Bruce - maximal  Max BP: 177/68  Max MPHR (bpm):  165 85% MPR (bpm):  140  MPHR obtained (bpm):  164 % MPHR obtained:  98%  Reached 85% MPHR (min:sec):  3:30 Total Exercise Time (min-sec):  5:00  Workload in METS:  6.8 Borg Scale: 12  Reason ETT Terminated:  desired heart rate attained    ST Segment Analysis At Rest: normal ST segments - no evidence of significant ST depression With Exercise: non-specific ST changes  Other Information Arrhythmia:  No Angina during ETT:  absent (0) Quality of ETT:  diagnostic  ETT Interpretation:  normal - no evidence of ischemia by ST analysis  Comments: Poor exercise tolerance. No chest pain. Normal BP response to exercise. Increased artifact somewhat limits interpretation. No obvious ST-T changes to suggest ischemia.   Recommendations: We discussed increasing activity level gradually. He can follow up with Dr. Olga Millers prn. He will continue to follow up with his PCP for HTN. Tereso Newcomer, PA-C  9:36 AM 06/15/2011

## 2011-08-15 IMAGING — CR DG CHEST 1V PORT
1 series · 1 of 1 positions shown · non-contrast
Comparison: None.

CLINICAL DATA: Atrial flutter.

PORTABLE CHEST - 1 VIEW

[view not recorded]
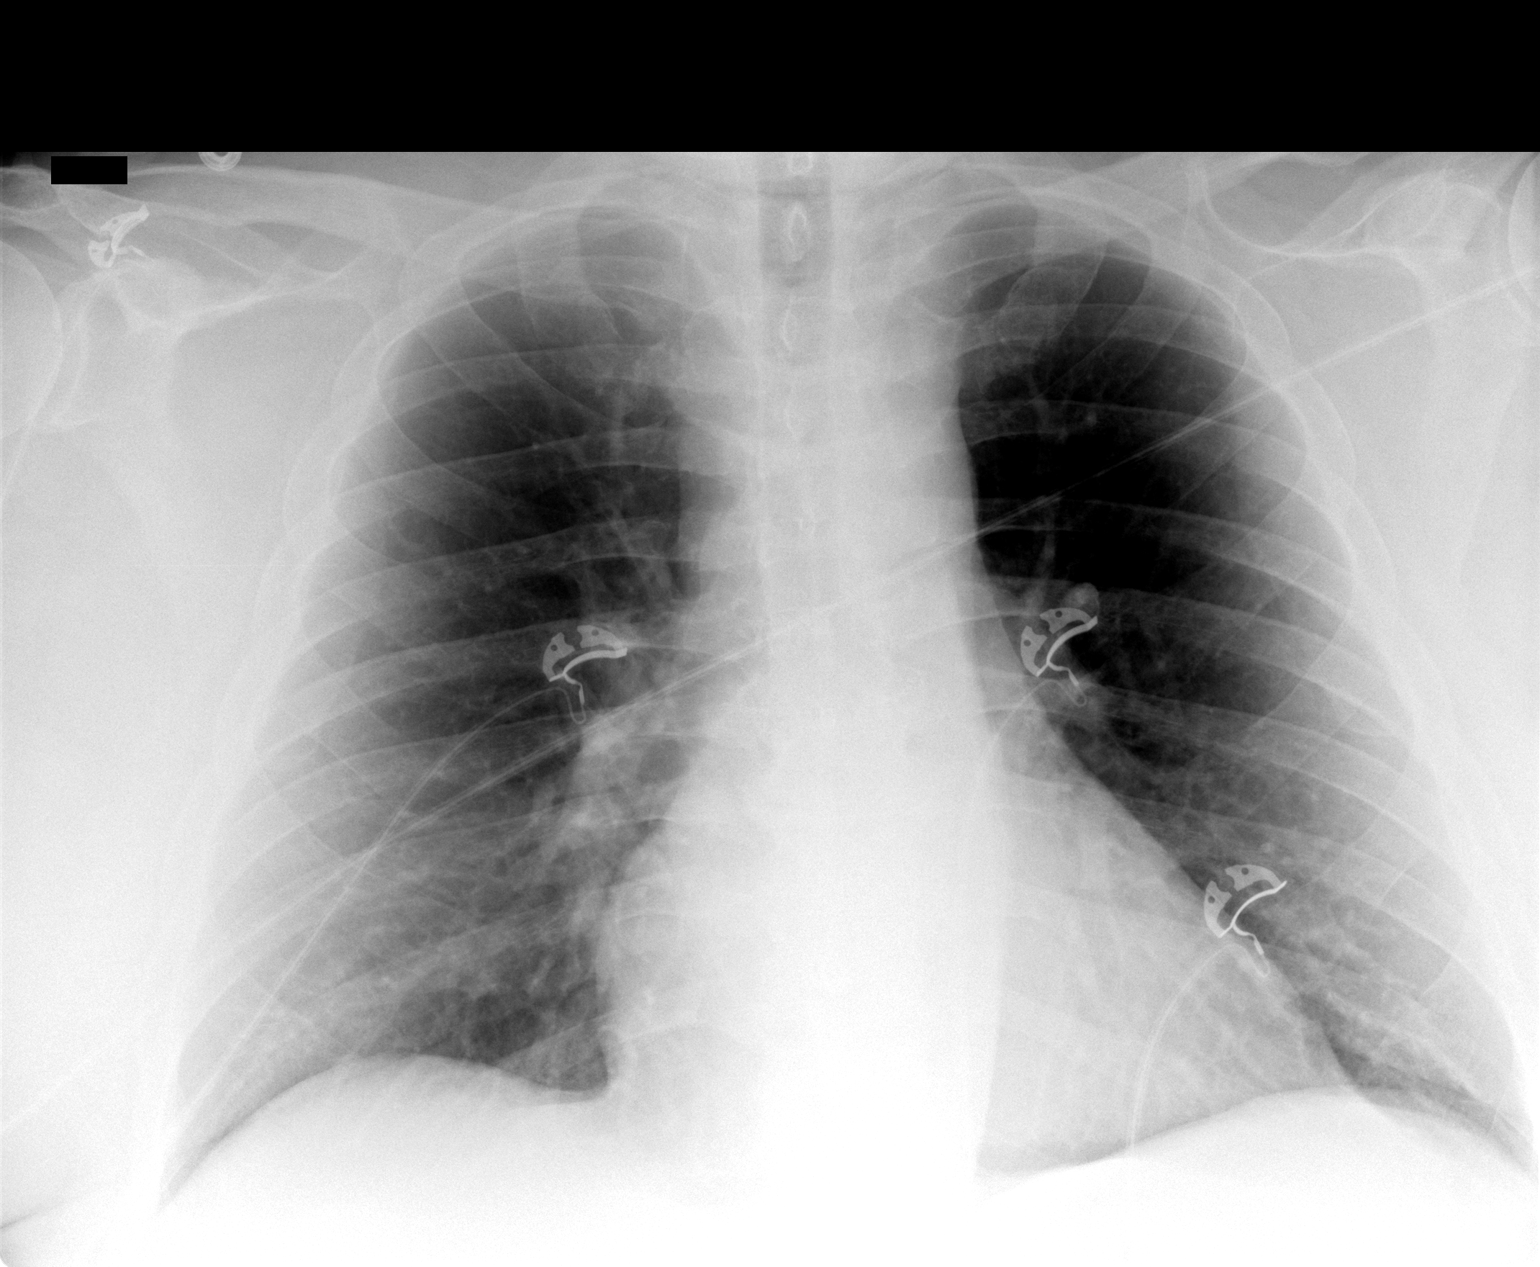

[1 of 1 positions shown; findings below may reference images not displayed]

FINDINGS: No infiltrate, congestive heart failure or pneumothorax.
Heart size within normal limits. The patient would eventually
benefit from follow-up two-view chest with cardiac leads removed.
Mildly tortuous aorta.
IMPRESSION: No infiltrate, congestive heart failure or pneumothorax.

## 2011-11-05 ENCOUNTER — Telehealth: Payer: Self-pay | Admitting: Internal Medicine

## 2011-11-05 NOTE — Telephone Encounter (Signed)
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

## 2011-11-05 NOTE — Telephone Encounter (Signed)
Please return call to patient wife Aggie Cosier, pt needs documentation to return to work [phone]

## 2011-11-05 NOTE — Telephone Encounter (Signed)
Pt had an ablation in 02/2011 by Dr. 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  Does he need an appointment or is this something Dr. Graciela Husbands can issue without an appointment?  Please call the wife to discuss.

## 2011-11-06 ENCOUNTER — Telehealth: Payer: Self-pay | Admitting: Cardiology

## 2011-11-06 ENCOUNTER — Encounter: Payer: Self-pay | Admitting: Internal Medicine

## 2011-11-06 NOTE — Telephone Encounter (Signed)
Dr. Graciela Husbands dictated a letter and this was faxed to his wife at [phone].

## 2011-11-06 NOTE — Telephone Encounter (Signed)
A- flutter ablation done 02/2011. Dr. Graciela Husbands released 03/2011. Saw Scott in 05/1011 for HTN/ CP and a treadmill test. The patient was to follow up PRN. Will forward to Dr. Graciela Husbands for review and recommendations.

## 2011-11-06 NOTE — Telephone Encounter (Signed)
Pt's wife calling re pt needing a note , doesn't want to tell me what it needs to say

## 2011-11-06 NOTE — Telephone Encounter (Signed)
Advised pt per yesterday's message that Herbert Seta would be calling him back.

## 2011-11-30 ENCOUNTER — Ambulatory Visit (HOSPITAL_BASED_OUTPATIENT_CLINIC_OR_DEPARTMENT_OTHER): Payer: Federal, State, Local not specified - PPO | Attending: Family Medicine

## 2011-11-30 VITALS — Ht 69.0 in | Wt 363.0 lb

## 2011-11-30 DIAGNOSIS — Z9989 Dependence on other enabling machines and devices: Secondary | ICD-10-CM

## 2011-11-30 DIAGNOSIS — G4733 Obstructive sleep apnea (adult) (pediatric): Secondary | ICD-10-CM | POA: Insufficient documentation

## 2011-12-06 DIAGNOSIS — G4733 Obstructive sleep apnea (adult) (pediatric): Secondary | ICD-10-CM

## 2011-12-07 NOTE — Procedures (Signed)
Ryan Jones, Ryan Jones              ACCOUNT NO.:  [id number]  MEDICAL RECORD NO.:  [id number]          PATIENT TYPE:  OUT  LOCATION:  SLEEP CENTER                 FACILITY:  [facility]  PHYSICIAN:  Rainer Mounce D. Maple Hudson, MD, FCCP, FACPDATE OF BIRTH:  1955/08/05  DATE OF STUDY:  11/30/2011                           NOCTURNAL POLYSOMNOGRAM  REFERRING PHYSICIAN:  Gloriajean Dell. Andrey Campanile, M.D.  INDICATION FOR STUDY:  Hypersomnia with sleep apnea.  EPWORTH SLEEPINESS SCORE:  2/24, BMI 53, weight 360 pounds, height 69 inches, neck 21 inches.  MEDICATIONS:  Home medications are charted and reviewed.  SLEEP ARCHITECTURE:  Split study protocol.  During the diagnostic phase, a total sleep time 123.5 minutes with sleep efficiency of 74.2%.  Stage I was 8.1%, stage II 68%.  Stage III absent.  REM 23.9% of total sleep time.  Sleep latency 25.5 minutes, REM latency 85.5 minutes, awake after sleep onset 15.5 minutes.  Arousal index 1.0.  Bedtime Medication:  None.  RESPIRATORY DATA:  Split study protocol.  Apnea-hypopnea index (AHI) 15.5 per hour.  A total of 32 events were scored including 1 central apnea and 31 hypopneas.  Events were associated with nonsupine sleep position.  REM AHI 52.9 per hour.  CPAP was then titrated to 12 CWP, AHI 5.2 per hour.  He wore a medium wide ResMed Mirage FX mask with heated humidifier and a C-Flex setting of 3.  OXYGEN DATA:  Before CPAP, snoring was moderately loud with oxygen desaturation to a nadir of 79% on room air.  With CPAP titration, snoring was presented and mean oxygen saturation held 93.5% on room air.  CARDIAC DATA:  Sinus rhythm with PVCs.  MOVEMENT-PARASOMNIA:  No significant movement disturbance.  Bathroom x1.  IMPRESSION-RECOMMENDATIONS: 1. Moderate obstructive sleep apnea/hypopnea syndrome, AHI 15.5 per     hour with non-supine events.  Moderate snoring with oxygen     desaturation to a nadir of 79% on room air. 2. Successful CPAP titration to 12 CWP,  AHI 5.2 per hour.  He wore a     medium wide ResMed Mirage FX full-     face mask with heated humidifier and a C-Flex setting of 3.     Snoring was presented and mean oxygen saturation held 93.5% on room     air.     Jquan Egelston D. Maple Hudson, MD, [facility], FACP Diplomate, American Board of Sleep Medicine    CDY/MEDQ  D:  12/06/2011 14:01:43  T:  12/07/2011 04:16:07  Job:  [id number]

## 2012-10-12 ENCOUNTER — Other Ambulatory Visit: Payer: Self-pay | Admitting: Family Medicine

## 2012-10-12 ENCOUNTER — Ambulatory Visit
Admission: RE | Admit: 2012-10-12 | Discharge: 2012-10-12 | Disposition: A | Discharge status: Home / Self Care | Payer: Federal, State, Local not specified - PPO | Source: Ambulatory Visit | Attending: Family Medicine | Admitting: Family Medicine

## 2012-10-12 DIAGNOSIS — J329 Chronic sinusitis, unspecified: Secondary | ICD-10-CM

## 2012-11-23 ENCOUNTER — Other Ambulatory Visit: Payer: Self-pay | Admitting: Otolaryngology

## 2012-11-24 ENCOUNTER — Telehealth: Payer: Self-pay | Admitting: Internal Medicine

## 2012-11-24 DIAGNOSIS — R002 Palpitations: Secondary | ICD-10-CM

## 2012-11-24 NOTE — Telephone Encounter (Signed)
Spoke with pt wife, pt had sinus surgery yesterday and was told he had atrial fib and then went back to sinus. They told him to follow up with [place]. For the last few weeks the pt has had short episodes of his heart racing and then it will stop. It does not occur daily. The pt is unable to come in for an EKG because of the recent surgery. Will schedule pt to wear an event monitor to catch the rhythm to comfirm atrial fib. Pt scheduled to see dr Graciela Husbands in follow up to review monitor results.

## 2012-11-24 NOTE — Telephone Encounter (Signed)
New Problem '  In the middle of the EKG he was having A-Fib and after surgury it went into regular sinus rythem/// today his heart is speeding up randomly. Dr. states he needs to follow up with cardi// Please advise.

## 2012-12-06 ENCOUNTER — Encounter (INDEPENDENT_AMBULATORY_CARE_PROVIDER_SITE_OTHER): Payer: Federal, State, Local not specified - PPO

## 2012-12-06 ENCOUNTER — Encounter: Payer: Self-pay | Admitting: *Deleted

## 2012-12-06 DIAGNOSIS — I4891 Unspecified atrial fibrillation: Secondary | ICD-10-CM

## 2012-12-06 DIAGNOSIS — R002 Palpitations: Secondary | ICD-10-CM

## 2012-12-06 NOTE — Progress Notes (Signed)
Patient ID: Ryan Jones, male   DOB: June 07, 1955, 57 y.o.   MRN: [id number] E-Cardio verite 30 day cardiac event monitor applied to patient.

## 2012-12-13 ENCOUNTER — Telehealth: Payer: Self-pay | Admitting: Internal Medicine

## 2012-12-13 NOTE — Telephone Encounter (Signed)
Spoke with pt, questions regarding monitor answered. 

## 2012-12-13 NOTE — Telephone Encounter (Signed)
New Prob ° ° ° ° ° °Pt has some questions regarding his monitor. Please call. °

## 2012-12-29 ENCOUNTER — Encounter: Payer: Self-pay | Admitting: Internal Medicine

## 2012-12-29 ENCOUNTER — Ambulatory Visit (INDEPENDENT_AMBULATORY_CARE_PROVIDER_SITE_OTHER): Payer: Federal, State, Local not specified - PPO | Admitting: Internal Medicine

## 2012-12-29 VITALS — BP 142/90 | HR 84 | Ht 69.0 in | Wt 371.8 lb

## 2012-12-29 DIAGNOSIS — I4892 Unspecified atrial flutter: Secondary | ICD-10-CM

## 2012-12-29 DIAGNOSIS — I10 Essential (primary) hypertension: Secondary | ICD-10-CM

## 2012-12-29 NOTE — Patient Instructions (Addendum)
Your physician recommends that you schedule a follow-up appointment as needed  

## 2012-12-29 NOTE — Progress Notes (Signed)
Patient Care Team: Henri Medal, MD as PCP - General (Family Medicine)   HPI  Ryan Jones is a 57 y.o. male Seen a few years after a catheter ablation for atrial flutter in 2012.  Apparently he had sinus surgery August 2014 and that  electrocardiogram read as atrial fibrillation; event recorder was prescribed  Review we'll echocardiogram demonstrates a to sinus rhythm with PACs.  He has hypertension and obstructive sleep apnea treated with CPAP  Past Medical History  Diagnosis Date  . Hypertension   . GERD (gastroesophageal reflux disease)   . Gout     "get it in my hands and feet"  . Atrial flutter 02/26/11    s/p RFCA 02/2011  . Seasonal allergies 02/26/11     "I take an allergy shot q week"  . Bronchitis     h/o recurrent bronchitis; "usually get it 1-2X/wy; last time 12/2010"  . Diabetes mellitus     "pills"  . Arthritis   . Gout   . 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 Constipation   . Diarrhea   . Hemorrhoids     Past Surgical History  Procedure Laterality Date  . Back surgery  before 1995 and in 1995;     "ruptured disc repaired; lower back"  . Lumbar disc surgery  ~ 1993    "put foam in back; in place of disc"  . Lumbar disc surgery  04/1993    "replaced gel foam that had blown out"  . Cardiac electrophysiology study and ablation  02/26/11  . Skin cancer excision  2011    right ear    Current Outpatient Prescriptions  Medication Sig Dispense Refill  . albuterol (PROVENTIL HFA;VENTOLIN HFA) 108 (90 BASE) MCG/ACT inhaler Inhale 2 puffs into the lungs every 6 (six) hours as needed. For shortness of breath.       Marland Kitchen amLODipine (NORVASC) 5 MG tablet Take 5 mg by mouth daily.      Marland Kitchen glipiZIDE (GLUCOTROL XL) 5 MG 24 hr tablet Take 5 mg by mouth 2 (two) times daily.      . hydrochlorothiazide (HYDRODIURIL) 25 MG tablet Take 25 mg by mouth daily.      . indomethacin (INDOCIN) 25 MG capsule Take 25 mg by mouth 3 (three) times daily as needed. For gout.      Marland Kitchen  losartan (COZAAR) 100 MG tablet Take 100 mg by mouth daily.      . Multiple Vitamin (MULTIVITAMIN) capsule Take 1 capsule by mouth daily.        Marland Kitchen omeprazole (PRILOSEC) 20 MG capsule Take 40 mg by mouth daily.       . pioglitazone-metformin (ACTOPLUS MET) 15-850 MG per tablet Take 1 tablet by mouth 2 (two) times daily with a meal.         No current facility-administered medications for this visit.    Allergies  Allergen Reactions  . Codeine Itching    Review of Systems negative except from HPI and PMH  Physical Exam BP 142/90  Pulse 84  Ht 5\' 9"  (1.753 m)  Wt 371 lb 12.8 oz (168.647 kg)  BMI 54.88 kg/m2 Well developed and nourished in no acute distress HENT normal Neck supple with JVP-flat Clear Regular rate and rhythm, no murmurs or gallops Abd-soft with active BS No Clubbing cyanosis edema Skin-warm and dry A & Oriented  Grossly normal sensory and motor function  Event recorder failed to show atrial fibrillation; only sinus rhythm  Assessment  and  Plan

## 2012-12-29 NOTE — Assessment & Plan Note (Signed)
Elevated; treated and treated for sleep apnea

## 2012-12-29 NOTE — Assessment & Plan Note (Signed)
This patient ECG that was concerning for atrial fibrillation. I reviewed it. It did not show atrial fibrillation. Event recorder failed to show atrial fibrillation.

## 2014-03-28 ENCOUNTER — Encounter (HOSPITAL_COMMUNITY): Payer: Self-pay | Admitting: Internal Medicine

## 2014-12-10 ENCOUNTER — Ambulatory Visit (INDEPENDENT_AMBULATORY_CARE_PROVIDER_SITE_OTHER): Payer: Federal, State, Local not specified - PPO | Admitting: Family

## 2014-12-10 ENCOUNTER — Encounter (INDEPENDENT_AMBULATORY_CARE_PROVIDER_SITE_OTHER): Payer: Self-pay

## 2014-12-10 ENCOUNTER — Encounter: Payer: Self-pay | Admitting: Family

## 2014-12-10 VITALS — BP 138/90 | HR 106 | Temp 97.1°F | Ht 69.0 in | Wt 334.0 lb

## 2014-12-10 DIAGNOSIS — E1165 Type 2 diabetes mellitus with hyperglycemia: Secondary | ICD-10-CM

## 2014-12-10 DIAGNOSIS — N3001 Acute cystitis with hematuria: Secondary | ICD-10-CM

## 2014-12-10 DIAGNOSIS — E119 Type 2 diabetes mellitus without complications: Secondary | ICD-10-CM | POA: Insufficient documentation

## 2014-12-10 DIAGNOSIS — M545 Low back pain: Secondary | ICD-10-CM

## 2014-12-10 LAB — POCT URINALYSIS DIPSTICK
Bilirubin, UA: NEGATIVE
Ketones, UA: NEGATIVE
Leukocytes, UA: NEGATIVE
Nitrite, UA: NEGATIVE
Spec Grav, UA: 1.01
Urobilinogen, UA: NEGATIVE
pH, UA: 5

## 2014-12-10 LAB — POCT UA - MICROSCOPIC ONLY
CASTS, UR, LPF, POC: NEGATIVE
Crystals, Ur, HPF, POC: NEGATIVE
Mucus, UA: NEGATIVE
YEAST UA: NEGATIVE

## 2014-12-10 LAB — GLUCOSE, POCT (MANUAL RESULT ENTRY): POC Glucose: 327 mg/dl — AB (ref 70–99)

## 2014-12-10 MED ORDER — CIPROFLOXACIN HCL 500 MG PO TABS: 500.0000 mg | ORAL_TABLET | Freq: Two times a day (BID) | ORAL | Status: DC

## 2014-12-10 NOTE — Progress Notes (Addendum)
Subjective:    Patient ID: Ryan Jones, male    DOB: [birth year], 59 y.o.   MRN: [id number]  Pt presents to the office today for  Back Pain This is a new problem. The current episode started in the past 7 days. The problem occurs constantly. The problem is unchanged. Associated symptoms include dysuria. Pertinent negatives include no headaches.  Dysuria  This is a new problem. The current episode started yesterday. The problem occurs intermittently. The problem has been waxing and waning. Associated symptoms include flank pain, frequency, nausea, urgency and vomiting. Pertinent negatives include no discharge or hematuria. He has tried increased fluids for the symptoms. The treatment provided mild relief.  Diabetes He presents for his initial diabetic visit. He has type 2 diabetes mellitus. His disease course has been worsening. Pertinent negatives for hypoglycemia include no confusion, dizziness or headaches. Associated symptoms include blurred vision. Pertinent negatives for diabetes include no foot paresthesias, no foot ulcerations and no visual change. Pertinent negatives for hypoglycemia complications include no blackouts. Symptoms are worsening. Pertinent negatives for diabetic complications include no CVA, heart disease, nephropathy or peripheral neuropathy. Risk factors for coronary artery disease include diabetes mellitus, male sex, obesity, family history, stress and sedentary lifestyle. Current diabetic treatment includes oral agent (triple therapy). He is compliant with treatment all of the time. He is following a diabetic diet. His breakfast blood glucose range is generally >200 mg/dl.      Review of Systems  Constitutional: Negative.   HENT: Negative.   Eyes: Positive for blurred vision.  Respiratory: Negative.   Cardiovascular: Negative.   Gastrointestinal: Positive for nausea and vomiting.  Endocrine: Negative.   Genitourinary: Positive for dysuria, urgency, frequency and  flank pain. Negative for hematuria.  Musculoskeletal: Positive for back pain.  Neurological: Negative.  Negative for dizziness and headaches.  Hematological: Negative.   Psychiatric/Behavioral: Negative.  Negative for confusion.  All other systems reviewed and are negative.      Objective:   Physical Exam  Constitutional: He is oriented to person, place, and time. He appears well-developed and well-nourished. No distress.  HENT:  Head: Normocephalic.  Right Ear: External ear normal.  Left Ear: External ear normal.  Mouth/Throat: Oropharynx is clear and moist.  Eyes: Pupils are equal, round, and reactive to light. Right eye exhibits no discharge. Left eye exhibits no discharge.  Neck: Normal range of motion. Neck supple. No thyromegaly present.  Cardiovascular: Normal rate, regular rhythm, normal heart sounds and intact distal pulses.   No murmur heard. Pulmonary/Chest: Effort normal and breath sounds normal. No respiratory distress. He has no wheezes.  Abdominal: Soft. Bowel sounds are normal. He exhibits no distension. There is no tenderness.  Musculoskeletal: Normal range of motion. He exhibits no edema or tenderness.  Negative for CVA tenderness   Neurological: He is alert and oriented to person, place, and time. He has normal reflexes. No cranial nerve deficit.  Skin: Skin is warm and dry. No rash noted. No erythema.  Psychiatric: He has a normal mood and affect. His behavior is normal. Judgment and thought content normal.  Vitals reviewed.     BP 138/90 mmHg  Pulse 106  Temp(Src) 97.1 F (36.2 C) (Oral)  Ht 5\' 9"  (1.753 m)  Wt 334 lb (151.501 kg)  BMI 49.30 kg/m2     Assessment & Plan:  1. Low back pain without sciatica, unspecified back pain laterality - POCT urinalysis dipstick - POCT UA - Microscopic Only - POCT glucose (manual entry)  2. Acute cystitis with hematuria -Force fluids AZO over the counter X2 days RTO in 2 weeks Culture pending -  ciprofloxacin (CIPRO) 500 MG tablet; Take 1 tablet (500 mg total) by mouth 2 (two) times daily.  Dispense: 20 tablet; Refill: 0  3. Type 2 diabetes mellitus with hyperglycemia -Low carb diet -Pt states his HgA1C in June was 7.0   Evelina Dun, FNP

## 2014-12-10 NOTE — Patient Instructions (Addendum)
Urinary Tract Infection Urinary tract infections (UTIs) can develop anywhere along your urinary tract. Your urinary tract is your body's drainage system for removing wastes and extra water. Your urinary tract includes two kidneys, two ureters, a bladder, and a urethra. Your kidneys are a pair of bean-shaped organs. Each kidney is about the size of your fist. They are located below your ribs, one on each side of your spine. CAUSES Infections are caused by microbes, which are microscopic organisms, including fungi, viruses, and bacteria. These organisms are so small that they can only be seen through a microscope. Bacteria are the microbes that most commonly cause UTIs. SYMPTOMS  Symptoms of UTIs may vary by age and gender of the patient and by the location of the infection. Symptoms in young women typically include a frequent and intense urge to urinate and a painful, burning feeling in the bladder or urethra during urination. Older women and men are more likely to be tired, shaky, and weak and have muscle aches and abdominal pain. A fever may mean the infection is in your kidneys. Other symptoms of a kidney infection include pain in your back or sides below the ribs, nausea, and vomiting. DIAGNOSIS To diagnose a UTI, your caregiver will ask you about your symptoms. Your caregiver also will ask to provide a urine sample. The urine sample will be tested for bacteria and white blood cells. White blood cells are made by your body to help fight infection. TREATMENT  Typically, UTIs can be treated with medication. Because most UTIs are caused by a bacterial infection, they usually can be treated with the use of antibiotics. The choice of antibiotic and length of treatment depend on your symptoms and the type of bacteria causing your infection. HOME CARE INSTRUCTIONS  If you were prescribed antibiotics, take them exactly as your caregiver instructs you. Finish the medication even if you feel better after you  have only taken some of the medication.  Drink enough water and fluids to keep your urine clear or pale yellow.  Avoid caffeine, tea, and carbonated beverages. They tend to irritate your bladder.  Empty your bladder often. Avoid holding urine for long periods of time.  Empty your bladder before and after sexual intercourse.  After a bowel movement, women should cleanse from front to back. Use each tissue only once. SEEK MEDICAL CARE IF:   You have back pain.  You develop a fever.  Your symptoms do not begin to resolve within 3 days. SEEK IMMEDIATE MEDICAL CARE IF:   You have severe back pain or lower abdominal pain.  You develop chills.  You have nausea or vomiting.  You have continued burning or discomfort with urination. MAKE SURE YOU:   Understand these instructions.  Will watch your condition.  Will get help right away if you are not doing well or get worse. Document Released: 01/14/2005 Document Revised: 10/06/2011 Document Reviewed: 05/15/2011 [facility] Patient Information 2015 [place], [place]. This information is not intended to replace advice given to you by your health care provider. Make sure you discuss any questions you have with your health care provider. Diabetes Mellitus and Food It is important for you to manage your blood sugar (glucose) level. Your blood glucose level can be greatly affected by what you eat. Eating healthier foods in the appropriate amounts throughout the day at about the same time each day will help you control your blood glucose level. It can also help slow or prevent worsening of your diabetes mellitus. Healthy eating  may even help you improve the level of your blood pressure and reach or maintain a healthy weight.  HOW CAN FOOD AFFECT ME? Carbohydrates Carbohydrates affect your blood glucose level more than any other type of food. Your dietitian will help you determine how many carbohydrates to eat at each meal and teach you how to count  carbohydrates. Counting carbohydrates is important to keep your blood glucose at a healthy level, especially if you are using insulin or taking certain medicines for diabetes mellitus. Alcohol Alcohol can cause sudden decreases in blood glucose (hypoglycemia), especially if you use insulin or take certain medicines for diabetes mellitus. Hypoglycemia can be a life-threatening condition. Symptoms of hypoglycemia (sleepiness, dizziness, and disorientation) are similar to symptoms of having too much alcohol.  If your health care provider has given you approval to drink alcohol, do so in moderation and use the following guidelines:  Women should not have more than one drink per day, and men should not have more than two drinks per day. One drink is equal to:  12 oz of beer.  5 oz of wine.  1 oz of hard liquor.  Do not drink on an empty stomach.  Keep yourself hydrated. Have water, diet soda, or unsweetened iced tea.  Regular soda, juice, and other mixers might contain a lot of carbohydrates and should be counted. WHAT FOODS ARE NOT RECOMMENDED? As you make food choices, it is important to remember that all foods are not the same. Some foods have fewer nutrients per serving than other foods, even though they might have the same number of calories or carbohydrates. It is difficult to get your body what it needs when you eat foods with fewer nutrients. Examples of foods that you should avoid that are high in calories and carbohydrates but low in nutrients include:  Trans fats (most processed foods list trans fats on the Nutrition Facts label).  Regular soda.  Juice.  Candy.  Sweets, such as cake, pie, doughnuts, and cookies.  Fried foods. WHAT FOODS CAN I EAT? Have nutrient-rich foods, which will nourish your body and keep you healthy. The food you should eat also will depend on several factors, including:  The calories you need.  The medicines you take.  Your weight.  Your blood  glucose level.  Your blood pressure level.  Your cholesterol level. You also should eat a variety of foods, including:  Protein, such as meat, poultry, fish, tofu, nuts, and seeds (lean animal proteins are best).  Fruits.  Vegetables.  Dairy products, such as milk, cheese, and yogurt (low fat is best).  Breads, grains, pasta, cereal, rice, and beans.  Fats such as olive oil, trans fat-free margarine, canola oil, avocado, and olives. DOES EVERYONE WITH DIABETES MELLITUS HAVE THE SAME MEAL PLAN? Because every person with diabetes mellitus is different, there is not one meal plan that works for everyone. It is very important that you meet with a dietitian who will help you create a meal plan that is just right for you. Document Released: 01/01/2005 Document Revised: 04/11/2013 Document Reviewed: 03/03/2013 [facility] Patient Information 2015 [place], [place]. This information is not intended to replace advice given to you by your health care provider. Make sure you discuss any questions you have with your health care provider. Basic Carbohydrate Counting for Diabetes Mellitus Carbohydrate counting is a method for keeping track of the amount of carbohydrates you eat. Eating carbohydrates naturally increases the level of sugar (glucose) in your blood, so it is important for  you to know the amount that is okay for you to have in every meal. Carbohydrate counting helps keep the level of glucose in your blood within normal limits. The amount of carbohydrates allowed is different for every person. A dietitian can help you calculate the amount that is right for you. Once you know the amount of carbohydrates you can have, you can count the carbohydrates in the foods you want to eat. Carbohydrates are found in the following foods:  Grains, such as breads and cereals.  Dried beans and soy products.  Starchy vegetables, such as potatoes, peas, and corn.  Fruit and fruit juices.  Milk and  yogurt.  Sweets and snack foods, such as cake, cookies, candy, chips, soft drinks, and fruit drinks. CARBOHYDRATE COUNTING There are two ways to count the carbohydrates in your food. You can use either of the methods or a combination of both. Reading the "Nutrition Facts" on [place] The "Nutrition Facts" is an area that is included on the labels of almost all packaged food and beverages in the [place]. It includes the serving size of that food or beverage and information about the nutrients in each serving of the food, including the grams (g) of carbohydrate per serving.  Decide the number of servings of this food or beverage that you will be able to eat or drink. Multiply that number of servings by the number of grams of carbohydrate that is listed on the label for that serving. The total will be the amount of carbohydrates you will be having when you eat or drink this food or beverage. Learning Standard Serving Sizes of Food When you eat food that is not packaged or does not include "Nutrition Facts" on the label, you need to measure the servings in order to count the amount of carbohydrates.A serving of most carbohydrate-rich foods contains about 15 g of carbohydrates. The following list includes serving sizes of carbohydrate-rich foods that provide 15 g ofcarbohydrate per serving:   1 slice of bread (1 oz) or 1 six-inch tortilla.    of a hamburger bun or English muffin.  4-6 crackers.   cup unsweetened dry cereal.    cup hot cereal.   cup rice or pasta.    cup mashed potatoes or  of a large baked potato.  1 cup fresh fruit or one small piece of fruit.    cup canned or frozen fruit or fruit juice.  1 cup milk.   cup plain fat-free yogurt or yogurt sweetened with artificial sweeteners.   cup cooked dried beans or starchy vegetable, such as peas, corn, or potatoes.  Decide the number of standard-size servings that you will eat. Multiply that number of  servings by 15 (the grams of carbohydrates in that serving). For example, if you eat 2 cups of strawberries, you will have eaten 2 servings and 30 g of carbohydrates (2 servings x 15 g = 30 g). For foods such as soups and casseroles, in which more than one food is mixed in, you will need to count the carbohydrates in each food that is included. EXAMPLE OF CARBOHYDRATE COUNTING Sample Dinner  3 oz chicken breast.   cup of brown rice.   cup of corn.  1 cup milk.   1 cup strawberries with sugar-free whipped topping.  Carbohydrate Calculation Step 1: Identify the foods that contain carbohydrates:   Rice.   Corn.   Milk.   Strawberries. Step 2:Calculate the number of servings eaten of each:  2 servings of rice.   1 serving of corn.   1 serving of milk.   1 serving of strawberries. Step 3: Multiply each of those number of servings by 15 g:   2 servings of rice x 15 g = 30 g.   1 serving of corn x 15 g = 15 g.   1 serving of milk x 15 g = 15 g.   1 serving of strawberries x 15 g = 15 g. Step 4: Add together all of the amounts to find the total grams of carbohydrates eaten: 30 g + 15 g + 15 g + 15 g = 75 g. Document Released: 04/06/2005 Document Revised: 08/21/2013 Document Reviewed: 03/03/2013 [facility] Patient Information 2015 [place], [place]. This information is not intended to replace advice given to you by your health care provider. Make sure you discuss any questions you have with your health care provider.

## 2014-12-25 ENCOUNTER — Ambulatory Visit: Payer: Federal, State, Local not specified - PPO | Admitting: Family

## 2014-12-31 ENCOUNTER — Other Ambulatory Visit: Payer: Self-pay | Admitting: Gastroenterology

## 2015-01-07 ENCOUNTER — Encounter (HOSPITAL_COMMUNITY): Payer: Self-pay | Admitting: *Deleted

## 2015-01-18 ENCOUNTER — Ambulatory Visit (HOSPITAL_COMMUNITY)
Admission: RE | Admit: 2015-01-18 | Discharge: 2015-01-18 | Disposition: A | Discharge status: Home / Self Care | Payer: Federal, State, Local not specified - PPO | Source: Ambulatory Visit | Attending: Gastroenterology | Admitting: Gastroenterology

## 2015-01-18 ENCOUNTER — Ambulatory Visit (HOSPITAL_COMMUNITY): Payer: Federal, State, Local not specified - PPO | Admitting: Registered Nurse

## 2015-01-18 ENCOUNTER — Encounter (HOSPITAL_COMMUNITY): Payer: Self-pay | Admitting: *Deleted

## 2015-01-18 ENCOUNTER — Encounter (HOSPITAL_COMMUNITY)
Admission: RE | Disposition: A | Discharge status: Home / Self Care | Payer: Self-pay | Source: Ambulatory Visit | Attending: Gastroenterology

## 2015-01-18 DIAGNOSIS — I10 Essential (primary) hypertension: Secondary | ICD-10-CM | POA: Diagnosis not present

## 2015-01-18 DIAGNOSIS — D122 Benign neoplasm of ascending colon: Secondary | ICD-10-CM | POA: Insufficient documentation

## 2015-01-18 DIAGNOSIS — F17290 Nicotine dependence, other tobacco product, uncomplicated: Secondary | ICD-10-CM | POA: Insufficient documentation

## 2015-01-18 DIAGNOSIS — G4730 Sleep apnea, unspecified: Secondary | ICD-10-CM | POA: Insufficient documentation

## 2015-01-18 DIAGNOSIS — K5520 Angiodysplasia of colon without hemorrhage: Secondary | ICD-10-CM | POA: Diagnosis not present

## 2015-01-18 DIAGNOSIS — D123 Benign neoplasm of transverse colon: Secondary | ICD-10-CM | POA: Diagnosis not present

## 2015-01-18 DIAGNOSIS — Z1211 Encounter for screening for malignant neoplasm of colon: Secondary | ICD-10-CM | POA: Insufficient documentation

## 2015-01-18 DIAGNOSIS — D120 Benign neoplasm of cecum: Secondary | ICD-10-CM | POA: Insufficient documentation

## 2015-01-18 DIAGNOSIS — K219 Gastro-esophageal reflux disease without esophagitis: Secondary | ICD-10-CM | POA: Insufficient documentation

## 2015-01-18 DIAGNOSIS — K635 Polyp of colon: Secondary | ICD-10-CM | POA: Diagnosis not present

## 2015-01-18 DIAGNOSIS — E119 Type 2 diabetes mellitus without complications: Secondary | ICD-10-CM | POA: Diagnosis not present

## 2015-01-18 DIAGNOSIS — M109 Gout, unspecified: Secondary | ICD-10-CM | POA: Diagnosis not present

## 2015-01-18 HISTORY — DX: Sleep apnea, unspecified: G47.30

## 2015-01-18 HISTORY — DX: Calculus of kidney: N20.0

## 2015-01-18 HISTORY — PX: COLONOSCOPY WITH PROPOFOL: SHX5780

## 2015-01-18 LAB — GLUCOSE, CAPILLARY: GLUCOSE-CAPILLARY: 148 mg/dL — AB (ref 65–99)

## 2015-01-18 SURGERY — COLONOSCOPY WITH PROPOFOL
Anesthesia: Monitor Anesthesia Care

## 2015-01-18 MED ORDER — LACTATED RINGERS IV SOLN
INTRAVENOUS | Status: DC
  Administered 2015-01-18: 1000 mL via INTRAVENOUS

## 2015-01-18 MED ORDER — PROPOFOL 10 MG/ML IV BOLUS
INTRAVENOUS | Status: AC
  Filled 2015-01-18: qty 20

## 2015-01-18 MED ORDER — LIDOCAINE HCL (CARDIAC) 20 MG/ML IV SOLN
INTRAVENOUS | Status: DC | PRN
  Administered 2015-01-18: 100 mg via INTRAVENOUS

## 2015-01-18 MED ORDER — LIDOCAINE HCL (CARDIAC) 20 MG/ML IV SOLN
INTRAVENOUS | Status: AC
  Filled 2015-01-18: qty 5

## 2015-01-18 MED ORDER — SODIUM CHLORIDE 0.9 % IJ SOLN
INTRAMUSCULAR | Status: AC
  Filled 2015-01-18: qty 10

## 2015-01-18 MED ORDER — EPHEDRINE SULFATE 50 MG/ML IJ SOLN
INTRAMUSCULAR | Status: AC
  Filled 2015-01-18: qty 1

## 2015-01-18 MED ORDER — SODIUM CHLORIDE 0.9 % IV SOLN: INTRAVENOUS | Status: DC

## 2015-01-18 MED ORDER — PROPOFOL 500 MG/50ML IV EMUL
INTRAVENOUS | Status: DC | PRN
  Administered 2015-01-18: 140 ug/kg/min via INTRAVENOUS

## 2015-01-18 SURGICAL SUPPLY — 21 items

## 2015-01-18 NOTE — Op Note (Signed)
[facility] [place], [id number]   COLONOSCOPY PROCEDURE REPORT  PATIENT: Ryan Jones, Ryan Jones  MR#: [id number] BIRTHDATE: 01-Oct-1955 , 2  yrs. old GENDER: male ENDOSCOPIST: Carol Ada, MD REFERRED BY: PROCEDURE DATE:  02-09-2015 PROCEDURE:   Colonoscopy with snare polypectomy ASA CLASS:   Class III INDICATIONS: CRC MEDICATIONS: Monitored anesthesia care  DESCRIPTION OF PROCEDURE:   After the risks and benefits and of the procedure were explained, informed consent was obtained.  revealed no abnormalities of the rectum.    The Pentax Adult Colonscope [id number]  endoscope was introduced through the anus and advanced to the cecum, which was identified by both the appendix and ileocecal valve .  The quality of the prep was excellent. .  The instrument was then slowly withdrawn as the colon was fully examined. Estimated blood loss is zero unless otherwise noted in this procedure report.    FINDINGS: A total of 7 polyps were removed from the cecum, ascending colon, transverse colon , and sigmoid colon.  The polyps were 3-4 mm in size, on average.  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            The scope was then withdrawn from the patient and the procedure completed.  WITHDRAWAL TIME: 21 minutes  COMPLICATIONS: There were no immediate complications. ENDOSCOPIC IMPRESSION: 1) Polyps.  RECOMMENDATIONS: 1) Follow up biopsy results. 2) Repeat the colonoscopy in 3-5 years.  REPEAT EXAM:  cc:  _______________________________ eSignedCarol Ada, MD 2015/02/09 9:46 AM   CPT CODES: ICD CODES:  The ICD and CPT codes recommended by this software are interpretations from the data that the clinical staff has captured with the software.  The verification of the translation of this report to the ICD and CPT codes and modifiers is the sole responsibility of the health care institution and practicing physician  where this report was generated.  [employer]. will not be held responsible for the validity of the ICD and CPT codes included on this report.  AMA assumes no liability for data contained or not contained herein. CPT is a Designer, television/film set of the [employer].   PATIENT NAME:  Ryan Jones, Ryan Jones MR#: [id number]

## 2015-01-18 NOTE — Anesthesia Procedure Notes (Signed)
Procedure Name: MAC Date/Time: 01/18/2015 9:04 AM Performed by: Carleene Cooper A Pre-anesthesia Checklist: Patient identified, Timeout performed, Suction available, Emergency Drugs available and Patient being monitored Patient Re-evaluated:Patient Re-evaluated prior to inductionOxygen Delivery Method: Simple face mask Dental Injury: Teeth and Oropharynx as per pre-operative assessment

## 2015-01-18 NOTE — Transfer of Care (Signed)
Immediate Anesthesia Transfer of Care Note  Patient: Ryan Jones  Procedure(s) Performed: Procedure(s): COLONOSCOPY WITH PROPOFOL (N/A)  Patient Location: PACU and Endoscopy Unit  Anesthesia Type:MAC  Level of Consciousness: awake, alert , oriented and patient cooperative  Airway & Oxygen Therapy: Patient Spontanous Breathing and Patient connected to face mask oxygen  Post-op Assessment: Report given to RN, Post -op Vital signs reviewed and stable and Patient moving all extremities  Post vital signs: Reviewed and stable  Last Vitals:  Filed Vitals:   01/18/15 0739  BP: 155/80  Temp:   Resp: 17    Complications: No apparent anesthesia complications

## 2015-01-18 NOTE — H&P (Signed)
  Ryan Jones HPI: The patient is here for his follow up colonoscopy.  In 2006 his CRC only revealed a hyperplastic polyp in the cecum.  No complaints of chest pain, SOB, or MI.  No issues with diarrhea, constipation, hematochezia, melena, or abdominal pain.  Past Medical History  Diagnosis Date  . Hypertension   . GERD (gastroesophageal reflux disease)   . Gout     "get it in my hands and feet"  . Atrial flutter 02/26/11    s/p RFCA 02/2011  . Seasonal allergies 02/26/11     "I take an allergy shot q week"  . Bronchitis     h/o recurrent bronchitis; "usually get it 1-2X/wy; last time 12/2010"  . Diabetes mellitus     "pills"  . Arthritis   . Gout   . Constipation   . Diarrhea   . Hemorrhoids   . 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 Sleep apnea     no cpap usesd  . Kidney stone     1999    Past Surgical History  Procedure Laterality Date  . Back surgery  before 1995 and in 1995;     "ruptured disc repaired; lower back"  . Lumbar disc surgery  ~ 1993    "put foam in back; in place of disc"  . Lumbar disc surgery  04/1993    "replaced gel foam that had blown out"  . Cardiac electrophysiology study and ablation  02/26/11  . Skin cancer excision  2011    right ear  . Atrial flutter ablation N/A 02/26/2011    Procedure: ATRIAL FLUTTER ABLATION;  Surgeon: Deboraha Sprang, MD;  Location: [facility] CATH LAB;  Service: Cardiovascular;  Laterality: N/A;    History reviewed. No pertinent family history.  Social History:  reports that he has never smoked. His smokeless tobacco use includes Snuff. He reports that he does not drink alcohol or use illicit drugs.  Allergies:  Allergies  Allergen Reactions  . Codeine Itching    Medications:  Scheduled:  Continuous: . sodium chloride    . lactated ringers 1,000 mL (01/18/15 0747)    Results for orders placed or performed during the hospital encounter of 01/18/15 (from the past 24 hour(s))  Glucose, capillary     Status: Abnormal   Collection Time: 01/18/15  7:43 AM  Result Value Ref Range   Glucose-Capillary 148 (H) 65 - 99 mg/dL     No results found.  ROS:  As stated above in the HPI otherwise negative.  Blood pressure 155/80, temperature 98.1 F (36.7 C), temperature source Oral, resp. rate 17, height 5\' 9"  (1.753 m), weight 155.13 kg (342 lb), SpO2 97 %.    PE: Gen: NAD, Alert and Oriented HEENT:  [place]/AT, EOMI Neck: Supple, no LAD Lungs: CTA Bilaterally CV: RRR without M/G/R ABM: Soft, NTND, +BS Ext: No C/C/E  Assessment/Plan: 1) Screening colonoscopy.  Ryan Jones 01/18/2015, 9:01 AM

## 2015-01-18 NOTE — Discharge Instructions (Signed)
Colonoscopy, Care After °Refer to this sheet in the next few weeks. These instructions provide you with information on caring for yourself after your procedure. Your health care provider may also give you more specific instructions. Your treatment has been planned according to current medical practices, but problems sometimes occur. Call your health care provider if you have any problems or questions after your procedure. °WHAT TO EXPECT AFTER THE PROCEDURE  °After your procedure, it is typical to have the following: °· A small amount of blood in your stool. °· Moderate amounts of gas and mild abdominal cramping or bloating. °HOME CARE INSTRUCTIONS °· Do not drive, operate machinery, or sign important documents for 24 hours. °· You may shower and resume your regular physical activities, but move at a slower pace for the first 24 hours. °· Take frequent rest periods for the first 24 hours. °· Walk around or put a warm pack on your abdomen to help reduce abdominal cramping and bloating. °· Drink enough fluids to keep your urine clear or pale yellow. °· You may resume your normal diet as instructed by your health care provider. Avoid heavy or fried foods that are hard to digest. °· Avoid drinking alcohol for 24 hours or as instructed by your health care provider. °· Only take over-the-counter or prescription medicines as directed by your health care provider. °· If a tissue sample (biopsy) was taken during your procedure: °· Do not take aspirin or blood thinners for 7 days, or as instructed by your health care provider. °· Do not drink alcohol for 7 days, or as instructed by your health care provider. °· Eat soft foods for the first 24 hours. °SEEK MEDICAL CARE IF: °You have persistent spotting of blood in your stool 2-3 days after the procedure. °SEEK IMMEDIATE MEDICAL CARE IF: °· You have more than a small spotting of blood in your stool. °· You pass large blood clots in your stool. °· Your abdomen is swollen  (distended). °· You have nausea or vomiting. °· You have a fever. °· You have increasing abdominal pain that is not relieved with medicine. °Document Released: 11/19/2003 Document Revised: 01/25/2013 Document Reviewed: 12/12/2012 °ExitCare® Patient Information ©2015 [employer]. This information is not intended to replace advice given to you by your health care provider. Make sure you discuss any questions you have with your health care provider. ° °Monitored Anesthesia Care °Monitored anesthesia care is an anesthesia service for a medical procedure. Anesthesia is the loss of the ability to feel pain. It is produced by medicines called anesthetics. It may affect a small area of your body (local anesthesia), a large area of your body (regional anesthesia), or your entire body (general anesthesia). The need for monitored anesthesia care depends your procedure, your condition, and the potential need for regional or general anesthesia. It is often provided during procedures where:  °· General anesthesia may be needed if there are complications. This is because you need special care when you are under general anesthesia.   °· You will be under local or regional anesthesia. This is so that you are able to have higher levels of anesthesia if needed.   °· You will receive calming medicines (sedatives). This is especially the case if sedatives are given to put you in a semi-conscious state of relaxation (deep sedation). This is because the amount of sedative needed to produce this state can be hard to predict. Too much of a sedative can produce general anesthesia. °Monitored anesthesia care is performed by one   or more health care providers who have special training in all types of anesthesia. You will need to meet with these health care providers before your procedure. During this meeting, they will ask you about your medical history. They will also give you instructions to follow. (For example, you will need to stop  eating and drinking before your procedure. You may also need to stop or change medicines you are taking.) During your procedure, your health care providers will stay with you. They will:  °· Watch your condition. This includes watching your blood pressure, breathing, and level of pain.   °· Diagnose and treat problems that occur.   °· Give medicines if they are needed. These may include calming medicines (sedatives) and anesthetics.   °· Make sure you are comfortable.   °Having monitored anesthesia care does not necessarily mean that you will be under anesthesia. It does mean that your health care providers will be able to manage anesthesia if you need it or if it occurs. It also means that you will be able to have a different type of anesthesia than you are having if you need it. When your procedure is complete, your health care providers will continue to watch your condition. They will make sure any medicines wear off before you are allowed to go home.  °Document Released: 12/31/2004 Document Revised: 08/21/2013 Document Reviewed: 05/18/2012 °ExitCare® Patient Information ©2015 [employer]. This information is not intended to replace advice given to you by your health care provider. Make sure you discuss any questions you have with your health care provider. ° ° °

## 2015-01-18 NOTE — Anesthesia Postprocedure Evaluation (Signed)
  Anesthesia Post-op Note  Patient: Ryan Jones  Procedure(s) Performed: Procedure(s): COLONOSCOPY WITH PROPOFOL (N/A)  Patient Location: PACU  Anesthesia Type:MAC  Level of Consciousness: awake  Airway and Oxygen Therapy: Patient Spontanous Breathing  Post-op Pain: mild  Post-op Assessment: Post-op Vital signs reviewed              Post-op Vital Signs: Reviewed  Last Vitals:  Filed Vitals:   01/18/15 1001  BP: 150/95  Temp:   Resp:     Complications: No apparent anesthesia complications

## 2015-01-18 NOTE — Anesthesia Preprocedure Evaluation (Addendum)
Anesthesia Evaluation  Patient identified by MRN, date of birth, ID band Patient awake    Reviewed: Allergy & Precautions, NPO status   Airway Mallampati: II  TM Distance: >3 FB Neck ROM: Full    Dental   Pulmonary sleep apnea ,    breath sounds clear to auscultation       Cardiovascular hypertension,  Rhythm:Regular Rate:Normal + Systolic murmurs    Neuro/Psych    GI/Hepatic Neg liver ROS, GERD  ,  Endo/Other  diabetes  Renal/GU Renal disease     Musculoskeletal   Abdominal   Peds  Hematology   Anesthesia Other Findings   Reproductive/Obstetrics                          Anesthesia Physical Anesthesia Plan  ASA: III  Anesthesia Plan: MAC   Post-op Pain Management:    Induction: Intravenous  Airway Management Planned: Nasal Cannula, Simple Face Mask and Natural Airway  Additional Equipment:   Intra-op Plan:   Post-operative Plan:   Informed Consent: I have reviewed the patients History and Physical, chart, labs and discussed the procedure including the risks, benefits and alternatives for the proposed anesthesia with the patient or authorized representative who has indicated his/her understanding and acceptance.   Dental advisory given  Plan Discussed with: CRNA and Anesthesiologist  Anesthesia Plan Comments:       Anesthesia Quick Evaluation

## 2015-06-24 ENCOUNTER — Ambulatory Visit (INDEPENDENT_AMBULATORY_CARE_PROVIDER_SITE_OTHER): Payer: Federal, State, Local not specified - PPO | Admitting: Family Medicine

## 2015-06-24 ENCOUNTER — Encounter: Payer: Self-pay | Admitting: Family Medicine

## 2015-06-24 ENCOUNTER — Encounter: Payer: Self-pay | Admitting: *Deleted

## 2015-06-24 VITALS — BP 131/80 | HR 89 | Temp 97.8°F | Ht 69.0 in | Wt 350.0 lb

## 2015-06-24 DIAGNOSIS — R059 Cough, unspecified: Secondary | ICD-10-CM

## 2015-06-24 DIAGNOSIS — R05 Cough: Secondary | ICD-10-CM | POA: Diagnosis not present

## 2015-06-24 MED ORDER — DOXYCYCLINE HYCLATE 100 MG PO TABS: 100.0000 mg | ORAL_TABLET | Freq: Two times a day (BID) | ORAL | Status: DC

## 2015-06-24 MED ORDER — ALBUTEROL SULFATE HFA 108 (90 BASE) MCG/ACT IN AERS: 2.0000 | INHALATION_SPRAY | Freq: Four times a day (QID) | RESPIRATORY_TRACT | Status: AC | PRN

## 2015-06-24 MED ORDER — IPRATROPIUM-ALBUTEROL 0.5-2.5 (3) MG/3ML IN SOLN
3.0000 mL | RESPIRATORY_TRACT | Status: AC
  Administered 2015-06-24: 3 mL via RESPIRATORY_TRACT

## 2015-06-24 NOTE — Progress Notes (Signed)
   Subjective:    Patient ID: Ryan Jones, male    DOB: 1956/04/10, 60 y.o.   MRN: [id number]  HPI Patient here today for cough, congestion and fever that started on Thursday last week. He is unable to produce any sputum but feels like there is congestion in his chest. There is some sinus pressure but symptoms are more lower respiratory. He does not smoke.     Patient Active Problem List   Diagnosis Date Noted  . Diabetes mellitus, type 2 ([place]) 12/10/2014  . Chest pain, atypical 05/25/2011  . Obesity 04/03/2011  . Atrial flutter ([place]) 09/12/2009  . AODM 05/15/2008  . Essential hypertension, benign 05/15/2008   Outpatient Encounter Prescriptions as of 06/24/2015  Medication Sig  . albuterol (PROVENTIL HFA;VENTOLIN HFA) 108 (90 BASE) MCG/ACT inhaler Inhale 2 puffs into the lungs every 6 (six) hours as needed. For shortness of breath.   Marland Kitchen amLODipine (NORVASC) 5 MG tablet Take 5 mg by mouth daily.  . dapagliflozin propanediol (FARXIGA) 5 MG TABS tablet Take 5 mg by mouth daily.  . hydrochlorothiazide (HYDRODIURIL) 25 MG tablet Take 25 mg by mouth daily.  . indomethacin (INDOCIN) 50 MG capsule Take 50 mg by mouth 3 (three) times daily as needed for moderate pain (gout).  Marland Kitchen losartan (COZAAR) 100 MG tablet Take 100 mg by mouth daily.  . Multiple Vitamin (MULTIVITAMIN) capsule Take 1 capsule by mouth daily.    . NON FORMULARY once a week. Allergy shot  . omeprazole (PRILOSEC) 20 MG capsule Take 20 mg by mouth daily.   . pioglitazone-metformin (ACTOPLUS MET) 15-850 MG per tablet Take 1 tablet by mouth 2 (two) times daily with a meal.     No facility-administered encounter medications on file as of 06/24/2015.      Review of Systems  Constitutional: Positive for fever and chills.  HENT: Positive for congestion and sinus pressure.   Eyes: Negative.   Respiratory: Positive for cough.   Cardiovascular: Negative.   Gastrointestinal: Negative.   Endocrine: Negative.   Genitourinary:  Negative.   Musculoskeletal: Negative.   Skin: Negative.   Allergic/Immunologic: Negative.   Neurological: Negative.   Hematological: Negative.   Psychiatric/Behavioral: Negative.        Objective:   Physical Exam  Constitutional: He is oriented to person, place, and time. He appears well-developed and well-nourished.  HENT:  Head: Normocephalic.  Right Ear: External ear normal.  Left Ear: External ear normal.  Mouth/Throat: Oropharynx is clear and moist.  Cardiovascular: Normal rate and regular rhythm.   Pulmonary/Chest: Effort normal and breath sounds normal.  There are bilateral rhonchi.  Neurological: He is alert and oriented to person, place, and time.   BP 131/80 mmHg  Pulse 89  Temp(Src) 97.8 F (36.6 C) (Oral)  Ht _0  (1.753 m)  Wt 350 lb (158.759 kg)  BMI 51.66 kg/m2        Assessment & Plan:  1. Cough Symptoms and exam are consistent with bronchitis. He did get some relief with neb treatment. Will treat with doxycycline 100 mg twice a day for 1 week and refill albuterol metered-dose inhaler. - ipratropium-albuterol (DUONEB) 0.5-2.5 (3) MG/3ML nebulizer solution 3 mL; Take 3 mLs by nebulization stat.  Wardell Honour MD

## 2015-07-25 DIAGNOSIS — J301 Allergic rhinitis due to pollen: Secondary | ICD-10-CM | POA: Diagnosis not present

## 2015-07-25 DIAGNOSIS — J3089 Other allergic rhinitis: Secondary | ICD-10-CM | POA: Diagnosis not present

## 2015-07-26 ENCOUNTER — Ambulatory Visit (INDEPENDENT_AMBULATORY_CARE_PROVIDER_SITE_OTHER): Payer: Federal, State, Local not specified - PPO | Admitting: Family Medicine

## 2015-07-26 ENCOUNTER — Encounter: Payer: Self-pay | Admitting: Family Medicine

## 2015-07-26 ENCOUNTER — Encounter (INDEPENDENT_AMBULATORY_CARE_PROVIDER_SITE_OTHER): Payer: Self-pay

## 2015-07-26 VITALS — BP 149/75 | HR 73 | Temp 97.4°F | Ht 69.0 in | Wt 352.8 lb

## 2015-07-26 DIAGNOSIS — H9202 Otalgia, left ear: Secondary | ICD-10-CM | POA: Diagnosis not present

## 2015-07-26 MED ORDER — MOMETASONE FUROATE 50 MCG/ACT NA SUSP: NASAL | Status: AC

## 2015-07-26 NOTE — Progress Notes (Signed)
   HPI  Patient presents today here with ear pain.   States had 3 weeks of left ear. At first was described as irritation, over the last week or so it seems to be getting worse and is tolerating grams left neck. He denies any fever, chills, sweats, cough, or shortness of breath.  He does have TMJ and wonders if this is associated with it.  Please tried Nasonex inhaler. Good relief with that. He has been using Rhinocort intermittently lately  No muffled or decreased hearing  PMH: Smoking status noted ROS: Per HPI  Objective: BP 149/75 mmHg  Pulse 73  Temp(Src) 97.4 F (36.3 C) (Oral)  Ht 5\' 9"  (1.753 m)  Wt 352 lb 12.8 oz (160.029 kg)  BMI 52.08 kg/m2 Gen: NAD, alert, cooperative with exam HEENT: NCAT, nares swollen bilaterally, TMs normal bilaterally Some tenderness to palpation of the TMJ bilaterally with mouth opening CV: RRR, good S1/S2, no murmur Resp: CTABL, no wheezes, non-labored Ext: No edema, warm Neuro: Alert and oriented, No gross deficits  Assessment and plan:  #Ear Pain Likely perceiving TMJ pain He does have some eustachian tube dysfunction symptoms with feeling of fluid in the ears Start Nasonex, continue current allergy pills Return to clinic with any worsening or failure to improve as expected  Meds ordered this encounter  Medications  . mometasone (NASONEX) 50 MCG/ACT nasal spray    Sig: 2 sprays each nares once daily    Dispense:  17 g    Refill:  [place], MD [facility] 07/26/2015, 3:56 PM

## 2015-07-26 NOTE — Patient Instructions (Signed)
Great to meet you!  Try the nasonex  :Let us know if anything changes or gets worse.

## 2015-08-15 DIAGNOSIS — J3089 Other allergic rhinitis: Secondary | ICD-10-CM | POA: Diagnosis not present

## 2015-08-15 DIAGNOSIS — J301 Allergic rhinitis due to pollen: Secondary | ICD-10-CM | POA: Diagnosis not present

## 2015-08-19 DIAGNOSIS — G4733 Obstructive sleep apnea (adult) (pediatric): Secondary | ICD-10-CM | POA: Diagnosis not present

## 2015-08-30 DIAGNOSIS — J301 Allergic rhinitis due to pollen: Secondary | ICD-10-CM | POA: Diagnosis not present

## 2015-08-30 DIAGNOSIS — J3089 Other allergic rhinitis: Secondary | ICD-10-CM | POA: Diagnosis not present

## 2015-09-13 DIAGNOSIS — J301 Allergic rhinitis due to pollen: Secondary | ICD-10-CM | POA: Diagnosis not present

## 2015-09-13 DIAGNOSIS — J3089 Other allergic rhinitis: Secondary | ICD-10-CM | POA: Diagnosis not present

## 2015-09-18 DIAGNOSIS — J3089 Other allergic rhinitis: Secondary | ICD-10-CM | POA: Diagnosis not present

## 2015-09-18 DIAGNOSIS — H1045 Other chronic allergic conjunctivitis: Secondary | ICD-10-CM | POA: Diagnosis not present

## 2015-09-18 DIAGNOSIS — J301 Allergic rhinitis due to pollen: Secondary | ICD-10-CM | POA: Diagnosis not present

## 2015-09-27 DIAGNOSIS — J301 Allergic rhinitis due to pollen: Secondary | ICD-10-CM | POA: Diagnosis not present

## 2015-09-27 DIAGNOSIS — K219 Gastro-esophageal reflux disease without esophagitis: Secondary | ICD-10-CM | POA: Diagnosis not present

## 2015-09-27 DIAGNOSIS — J4520 Mild intermittent asthma, uncomplicated: Secondary | ICD-10-CM | POA: Diagnosis not present

## 2015-09-27 DIAGNOSIS — J3089 Other allergic rhinitis: Secondary | ICD-10-CM | POA: Diagnosis not present

## 2015-10-09 DIAGNOSIS — J301 Allergic rhinitis due to pollen: Secondary | ICD-10-CM | POA: Diagnosis not present

## 2015-10-09 DIAGNOSIS — J3089 Other allergic rhinitis: Secondary | ICD-10-CM | POA: Diagnosis not present

## 2015-10-10 ENCOUNTER — Encounter: Payer: Self-pay | Admitting: Family Medicine

## 2015-10-21 ENCOUNTER — Telehealth: Payer: Self-pay | Admitting: Family Medicine

## 2015-10-21 DIAGNOSIS — Z139 Encounter for screening, unspecified: Secondary | ICD-10-CM

## 2015-10-21 DIAGNOSIS — E1165 Type 2 diabetes mellitus with hyperglycemia: Secondary | ICD-10-CM

## 2015-10-21 DIAGNOSIS — I10 Essential (primary) hypertension: Secondary | ICD-10-CM

## 2015-10-21 NOTE — Telephone Encounter (Signed)
Needs A1c, ;ipids, CMP, PSA

## 2015-10-21 NOTE — Telephone Encounter (Signed)
Orders placed per Dr.Miller's request.

## 2015-10-25 ENCOUNTER — Encounter: Payer: Federal, State, Local not specified - PPO | Admitting: Family Medicine

## 2015-11-06 DIAGNOSIS — J301 Allergic rhinitis due to pollen: Secondary | ICD-10-CM | POA: Diagnosis not present

## 2015-11-06 DIAGNOSIS — J3089 Other allergic rhinitis: Secondary | ICD-10-CM | POA: Diagnosis not present

## 2015-11-11 DIAGNOSIS — J301 Allergic rhinitis due to pollen: Secondary | ICD-10-CM | POA: Diagnosis not present

## 2015-11-11 DIAGNOSIS — J3089 Other allergic rhinitis: Secondary | ICD-10-CM | POA: Diagnosis not present

## 2015-11-13 ENCOUNTER — Telehealth: Payer: Self-pay | Admitting: Family Medicine

## 2015-11-13 NOTE — Telephone Encounter (Signed)
Patient given an appointment for 4:10pm with Wendi Snipes.

## 2015-11-14 ENCOUNTER — Ambulatory Visit (INDEPENDENT_AMBULATORY_CARE_PROVIDER_SITE_OTHER): Payer: Federal, State, Local not specified - PPO | Admitting: Family Medicine

## 2015-11-14 ENCOUNTER — Encounter: Payer: Self-pay | Admitting: Family Medicine

## 2015-11-14 VITALS — BP 129/86 | HR 82 | Temp 98.4°F | Ht 69.0 in | Wt 349.4 lb

## 2015-11-14 DIAGNOSIS — J0101 Acute recurrent maxillary sinusitis: Secondary | ICD-10-CM

## 2015-11-14 MED ORDER — AMOXICILLIN-POT CLAVULANATE 875-125 MG PO TABS: 1.0000 | ORAL_TABLET | Freq: Two times a day (BID) | ORAL | 0 refills | Status: DC

## 2015-11-14 MED ORDER — OMEPRAZOLE 20 MG PO CPDR: 20.0000 mg | DELAYED_RELEASE_CAPSULE | Freq: Every day | ORAL | 2 refills | Status: DC

## 2015-11-14 NOTE — Progress Notes (Signed)
   HPI  Patient presents today here with cough and facial pain.  Patient states he's had cough and mild dyspnea responsive to his albuterol for the last 3 weeks. He's also had severe nasal congestion, facial pain and bilateral maxillary sinuses, and intermittent ear pain with muffled hearing.  He is tolerating food and fluids normally. Overall he is not struggling to breathe. He denies any chest pain.   PMH: Smoking status noted ROS: Per HPI  Objective: BP 129/86   Pulse 82   Temp 98.4 F (36.9 C) (Oral)   Ht 5\' 9"  (1.753 m)   Wt (!) 349 lb 6.4 oz (158.5 kg)   BMI 51.60 kg/m  Gen: NAD, alert, cooperative with exam HEENT: NCAT, TMs normal bilaterally, nares with swollen turbinates and erythema of the mucosa, bilateral maxillary sinuses with tenderness to palpation CV: RRR, good S1/S2, no murmur Resp: CTABL, no wheezes, non-labored Ext: No edema, warm Neuro: Alert and oriented, No gross deficits  Assessment and plan:  # Acute maxillary sinusitis Treat with Augmentin, continue Nasonex Return to clinic with any concerns or father to improve.     Laroy Apple, MD [facility] 11/14/2015, 4:20 PM

## 2015-11-14 NOTE — Patient Instructions (Signed)
Great to see you!  Be sure to finish all antibiotics   Sinusitis, Adult Sinusitis is redness, soreness, and puffiness (inflammation) of the air pockets in the bones of your face (sinuses). The redness, soreness, and puffiness can cause air and mucus to get trapped in your sinuses. This can allow germs to grow and cause an infection.  HOME CARE   Drink enough fluids to keep your pee (urine) clear or pale yellow.  Use a humidifier in your home.  Run a hot shower to create steam in the bathroom. Sit in the bathroom with the door closed. Breathe in the steam 3-4 times a day.  Put a warm, moist washcloth on your face 3-4 times a day, or as told by your doctor.  Use salt water sprays (saline sprays) to wet the thick fluid in your nose. This can help the sinuses drain.  Only take medicine as told by your doctor. GET HELP RIGHT AWAY IF:   Your pain gets worse.  You have very bad headaches.  You are sick to your stomach (nauseous).  You throw up (vomit).  You are very sleepy (drowsy) all the time.  Your face is puffy (swollen).  Your vision changes.  You have a stiff neck.  You have trouble breathing. MAKE SURE YOU:   Understand these instructions.  Will watch your condition.  Will get help right away if you are not doing well or get worse.   This information is not intended to replace advice given to you by your health care provider. Make sure you discuss any questions you have with your health care provider.   Document Released: 09/23/2007 Document Revised: 04/27/2014 Document Reviewed: 11/10/2011 Elsevier Interactive Patient Education [employer].

## 2015-11-21 ENCOUNTER — Other Ambulatory Visit: Payer: Federal, State, Local not specified - PPO

## 2015-11-21 DIAGNOSIS — E1165 Type 2 diabetes mellitus with hyperglycemia: Secondary | ICD-10-CM

## 2015-11-21 DIAGNOSIS — I10 Essential (primary) hypertension: Secondary | ICD-10-CM | POA: Diagnosis not present

## 2015-11-21 DIAGNOSIS — Z139 Encounter for screening, unspecified: Secondary | ICD-10-CM

## 2015-11-21 LAB — BAYER DCA HB A1C WAIVED: HB A1C (BAYER DCA - WAIVED): 10.8 % — ABNORMAL HIGH (ref <7.0)

## 2015-11-22 ENCOUNTER — Encounter: Payer: Self-pay | Admitting: Family Medicine

## 2015-11-22 ENCOUNTER — Ambulatory Visit (INDEPENDENT_AMBULATORY_CARE_PROVIDER_SITE_OTHER): Payer: Federal, State, Local not specified - PPO | Admitting: Family Medicine

## 2015-11-22 VITALS — BP 125/79 | HR 96 | Temp 97.4°F | Ht 69.0 in | Wt 351.0 lb

## 2015-11-22 DIAGNOSIS — I4892 Unspecified atrial flutter: Secondary | ICD-10-CM

## 2015-11-22 DIAGNOSIS — Z0000 Encounter for general adult medical examination without abnormal findings: Secondary | ICD-10-CM | POA: Diagnosis not present

## 2015-11-22 DIAGNOSIS — E1165 Type 2 diabetes mellitus with hyperglycemia: Secondary | ICD-10-CM

## 2015-11-22 DIAGNOSIS — I10 Essential (primary) hypertension: Secondary | ICD-10-CM

## 2015-11-22 LAB — LIPID PANEL
CHOL/HDL RATIO: 3.3 ratio (ref 0.0–5.0)
CHOLESTEROL TOTAL: 141 mg/dL (ref 100–199)
HDL: 43 mg/dL (ref >39)
LDL CALC: 50 mg/dL (ref 0–99)
TRIGLYCERIDES: 241 mg/dL — AB (ref 0–149)
VLDL CHOLESTEROL CAL: 48 mg/dL — AB (ref 5–40)

## 2015-11-22 LAB — CMP14+EGFR
A/G RATIO: 1.9 (ref 1.2–2.2)
ALK PHOS: 93 IU/L (ref 39–117)
ALT: 56 IU/L — AB (ref 0–44)
AST: 39 IU/L (ref 0–40)
Albumin: 4.3 g/dL (ref 3.5–5.5)
BILIRUBIN TOTAL: 0.3 mg/dL (ref 0.0–1.2)
BUN/Creatinine Ratio: 19 (ref 9–20)
BUN: 19 mg/dL (ref 6–24)
CHLORIDE: 97 mmol/L (ref 96–106)
CO2: 25 mmol/L (ref 18–29)
Calcium: 10 mg/dL (ref 8.7–10.2)
Creatinine, Ser: 1.02 mg/dL (ref 0.76–1.27)
GFR calc Af Amer: 93 mL/min/{1.73_m2} (ref >59)
GFR, EST NON AFRICAN AMERICAN: 80 mL/min/{1.73_m2} (ref >59)
GLOBULIN, TOTAL: 2.3 g/dL (ref 1.5–4.5)
Glucose: 296 mg/dL — ABNORMAL HIGH (ref 65–99)
POTASSIUM: 4.3 mmol/L (ref 3.5–5.2)
SODIUM: 138 mmol/L (ref 134–144)
Total Protein: 6.6 g/dL (ref 6.0–8.5)

## 2015-11-22 LAB — PSA, TOTAL AND FREE
PROSTATE SPECIFIC AG, SERUM: 1.7 ng/mL (ref 0.0–4.0)
PSA FREE: 0.44 ng/mL
PSA, Free Pct: 25.9 %

## 2015-11-22 MED ORDER — AMLODIPINE BESYLATE 5 MG PO TABS: 5.0000 mg | ORAL_TABLET | Freq: Every day | ORAL | 3 refills | Status: DC

## 2015-11-22 MED ORDER — LOSARTAN POTASSIUM 100 MG PO TABS: 100.0000 mg | ORAL_TABLET | Freq: Every day | ORAL | 3 refills | Status: DC

## 2015-11-22 MED ORDER — PIOGLITAZONE HCL-METFORMIN HCL 15-850 MG PO TABS: 1.0000 | ORAL_TABLET | Freq: Two times a day (BID) | ORAL | 3 refills | Status: DC

## 2015-11-22 MED ORDER — DAPAGLIFLOZIN PROPANEDIOL 5 MG PO TABS: 5.0000 mg | ORAL_TABLET | Freq: Every day | ORAL | 3 refills | Status: DC

## 2015-11-22 MED ORDER — HYDROCHLOROTHIAZIDE 25 MG PO TABS: 25.0000 mg | ORAL_TABLET | Freq: Every day | ORAL | 3 refills | Status: DC

## 2015-11-22 MED ORDER — PIOGLITAZONE HCL-METFORMIN ER 30-1000 MG PO TB24: 1.0000 | ORAL_TABLET | Freq: Every day | ORAL | 3 refills | Status: DC

## 2015-11-22 MED ORDER — HYDROCODONE-ACETAMINOPHEN 5-325 MG PO TABS: 1.0000 | ORAL_TABLET | Freq: Four times a day (QID) | ORAL | 0 refills | Status: DC | PRN

## 2015-11-22 MED ORDER — DAPAGLIFLOZIN PROPANEDIOL 5 MG PO TABS: 10.0000 mg | ORAL_TABLET | Freq: Every day | ORAL | 3 refills | Status: DC

## 2015-11-22 NOTE — Progress Notes (Signed)
Subjective:    Patient ID: Ryan Jones, male    DOB: [birth year], 60 y.o.   MRN: [id number]  HPI Patient is here today for annual wellness exam and follow up of chronic medical problems which includes diabetes and hypertension. He is taking medications regularly. 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 Medicines for diabetes include Farxiga 5 mg and Actos plus metformin 15/850 twice a day Other concerns are 2 skin lesions on the right temporal area of his face and some right hip pain which he aggravated it crawling around under a truck earlier this week.    Patient Active Problem List   Diagnosis Date Noted  . Diabetes mellitus, type 2 ([place]) 12/10/2014  . Chest pain, atypical 05/25/2011  . Obesity 04/03/2011  . Atrial flutter ([place]) 09/12/2009  . AODM 05/15/2008  . Essential hypertension, benign 05/15/2008   Outpatient Encounter Prescriptions as of 11/22/2015  Medication Sig  . albuterol (PROVENTIL HFA;VENTOLIN HFA) 108 (90 Base) MCG/ACT inhaler Inhale 2 puffs into the lungs every 6 (six) hours as needed. For shortness of breath.  Marland Kitchen amLODipine (NORVASC) 5 MG tablet Take 5 mg by mouth daily.  Marland Kitchen amoxicillin-clavulanate (AUGMENTIN) 875-125 MG tablet Take 1 tablet by mouth 2 (two) times daily.  . dapagliflozin propanediol (FARXIGA) 5 MG TABS tablet Take 5 mg by mouth daily.  . hydrochlorothiazide (HYDRODIURIL) 25 MG tablet Take 25 mg by mouth daily.  . indomethacin (INDOCIN) 50 MG capsule Take 50 mg by mouth 3 (three) times daily as needed for moderate pain (gout).  Marland Kitchen losartan (COZAAR) 100 MG tablet Take 100 mg by mouth daily.  . mometasone (NASONEX) 50 MCG/ACT nasal spray 2 sprays each nares once daily  . Multiple Vitamin (MULTIVITAMIN) capsule Take 1 capsule by mouth daily.    . NON FORMULARY once a week. Allergy shot  . omeprazole  (PRILOSEC) 20 MG capsule Take 1 capsule (20 mg total) by mouth daily.  Marland Kitchen OVER THE COUNTER MEDICATION cinnamon  . pioglitazone-metformin (ACTOPLUS MET) 15-850 MG per tablet Take 1 tablet by mouth 2 (two) times daily with a meal.     No facility-administered encounter medications on file as of 11/22/2015.       Review of Systems  Constitutional: Negative.   HENT: Negative.   Eyes: Negative.   Respiratory: Negative.   Cardiovascular: Negative.   Gastrointestinal: Negative.   Endocrine: Negative.   Genitourinary: Negative.   Musculoskeletal: Positive for back pain.  Skin: Negative.        Skin lesion - 2 on face  Allergic/Immunologic: Negative.   Neurological: Negative.   Hematological: Negative.   Psychiatric/Behavioral: Negative.        Objective:   Physical Exam  Constitutional: He is oriented to person, place, and time. He appears well-developed and well-nourished.  HENT:  Head: Normocephalic.  Right Ear: External ear normal.  Left Ear: External ear normal.  Mouth/Throat: Oropharynx is clear and moist.  Eyes: Pupils are equal, round, and reactive to light.  Neck: Normal range of motion.  Cardiovascular: Normal rate, regular rhythm, normal heart sounds and intact distal pulses.   Pulmonary/Chest: Effort normal and breath sounds normal.  Abdominal: Soft. Bowel sounds are normal.  Musculoskeletal: Normal range of motion.  Neurological: He is alert and oriented to person, place, and time. He has normal reflexes.  Skin:  Lesions of concern in the right temporal area appear to be a keratosis. Offered skin curette or liquid nitrogen and he declines at this time  Psychiatric: He has a normal mood and affect.    BP 125/79 (BP Location: Left Arm)   Pulse 96   Temp 97.4 F (36.3 C) (Oral)   Ht 5' 9" (1.753 m)   Wt (!) 351 lb (159.2 kg)   BMI 51.83 kg/m        Assessment & Plan:  1. Annual physical exam Patient is obese. This definitely impacts his diabetes and control  we talked about doing better with diet and weight loss  2. Essential hypertension, benign Blood pressures are well controlled with amlodipine and losartan  3. Type 2 diabetes mellitus with hyperglycemia, unspecified long term insulin use status (HCC) Along with some behavior modifications and weight loss will increase farxiga to 10 mg and Actos plus 2:30/1000 mg once a day (X are) Recheck A1c in 3 months  4. Atrial flutter, unspecified type ([place]) This was resolved with a cardiac procedure several years back  Wardell Honour MD

## 2015-11-22 NOTE — Patient Instructions (Signed)
Continue current medications. Continue good therapeutic lifestyle changes which include good diet and exercise. Fall precautions discussed with patient. If an FOBT was given today- please return it to our front desk. If you are over 59 years old - you may need Prevnar 13 or the adult Pneumonia vaccine.   After your visit with us today you will receive a survey in the mail or online from [employer] regarding your care with us. Please take a moment to fill this out. Your feedback is very important to us as you can help us better understand your patient needs as well as improve your experience and satisfaction. WE CARE ABOUT YOU!!!    

## 2015-11-25 ENCOUNTER — Telehealth: Payer: Self-pay | Admitting: Family Medicine

## 2015-11-25 MED ORDER — PIOGLITAZONE HCL-METFORMIN ER 30-1000 MG PO TB24: 1.0000 | ORAL_TABLET | Freq: Every day | ORAL | 3 refills | Status: DC

## 2015-11-25 NOTE — Telephone Encounter (Signed)
Re- sent rx to [facility]

## 2015-11-27 ENCOUNTER — Telehealth: Payer: Self-pay | Admitting: Family Medicine

## 2015-11-27 NOTE — Telephone Encounter (Signed)
Please advise 

## 2015-11-28 ENCOUNTER — Telehealth: Payer: Self-pay | Admitting: Family Medicine

## 2015-11-28 NOTE — Telephone Encounter (Signed)
Left detailed message for pt 

## 2015-11-28 NOTE — Telephone Encounter (Signed)
RX is ready per [facility] and the copay is $30 per Jonelle Sidle Pt's wife notified

## 2015-11-29 DIAGNOSIS — J3089 Other allergic rhinitis: Secondary | ICD-10-CM | POA: Diagnosis not present

## 2015-11-29 DIAGNOSIS — J301 Allergic rhinitis due to pollen: Secondary | ICD-10-CM | POA: Diagnosis not present

## 2015-11-29 NOTE — Telephone Encounter (Signed)
He was already taking [place]  I increased dose.  The Actos Plus metformin was changed to XR but if the BID form(not XR) is less expensive, okay to go with that

## 2015-12-02 NOTE — Telephone Encounter (Signed)
Closing encounter this was taken care of before answered by Dr. Sabra Heck. Pt is able to get the medication

## 2015-12-13 DIAGNOSIS — J3089 Other allergic rhinitis: Secondary | ICD-10-CM | POA: Diagnosis not present

## 2015-12-13 DIAGNOSIS — J301 Allergic rhinitis due to pollen: Secondary | ICD-10-CM | POA: Diagnosis not present

## 2016-01-03 DIAGNOSIS — J3089 Other allergic rhinitis: Secondary | ICD-10-CM | POA: Diagnosis not present

## 2016-01-03 DIAGNOSIS — J301 Allergic rhinitis due to pollen: Secondary | ICD-10-CM | POA: Diagnosis not present

## 2016-01-31 DIAGNOSIS — J3089 Other allergic rhinitis: Secondary | ICD-10-CM | POA: Diagnosis not present

## 2016-01-31 DIAGNOSIS — J301 Allergic rhinitis due to pollen: Secondary | ICD-10-CM | POA: Diagnosis not present

## 2016-02-03 ENCOUNTER — Other Ambulatory Visit: Payer: Self-pay | Admitting: Dermatology

## 2016-02-03 DIAGNOSIS — L570 Actinic keratosis: Secondary | ICD-10-CM | POA: Diagnosis not present

## 2016-02-03 DIAGNOSIS — D239 Other benign neoplasm of skin, unspecified: Secondary | ICD-10-CM | POA: Diagnosis not present

## 2016-02-03 DIAGNOSIS — D485 Neoplasm of uncertain behavior of skin: Secondary | ICD-10-CM | POA: Diagnosis not present

## 2016-02-03 DIAGNOSIS — L820 Inflamed seborrheic keratosis: Secondary | ICD-10-CM | POA: Diagnosis not present

## 2016-02-24 DIAGNOSIS — J3089 Other allergic rhinitis: Secondary | ICD-10-CM | POA: Diagnosis not present

## 2016-02-24 DIAGNOSIS — J301 Allergic rhinitis due to pollen: Secondary | ICD-10-CM | POA: Diagnosis not present

## 2016-03-20 DIAGNOSIS — J301 Allergic rhinitis due to pollen: Secondary | ICD-10-CM | POA: Diagnosis not present

## 2016-03-20 DIAGNOSIS — J3089 Other allergic rhinitis: Secondary | ICD-10-CM | POA: Diagnosis not present

## 2016-04-10 DIAGNOSIS — J3089 Other allergic rhinitis: Secondary | ICD-10-CM | POA: Diagnosis not present

## 2016-04-10 DIAGNOSIS — J301 Allergic rhinitis due to pollen: Secondary | ICD-10-CM | POA: Diagnosis not present

## 2016-04-23 ENCOUNTER — Telehealth: Payer: Self-pay | Admitting: Family Medicine

## 2016-04-23 NOTE — Telephone Encounter (Signed)
Indocin 50 mg 3 times a day when necessary gout #21

## 2016-04-24 MED ORDER — INDOMETHACIN 50 MG PO CAPS: 50.0000 mg | ORAL_CAPSULE | Freq: Three times a day (TID) | ORAL | 0 refills | Status: DC | PRN

## 2016-04-24 NOTE — Telephone Encounter (Signed)
rx sent to the pharmacy and left detailed message on VM stating requested rx sent into pharmacy and to call back with any further questions or concerns.

## 2016-05-04 DIAGNOSIS — J3089 Other allergic rhinitis: Secondary | ICD-10-CM | POA: Diagnosis not present

## 2016-05-04 DIAGNOSIS — J301 Allergic rhinitis due to pollen: Secondary | ICD-10-CM | POA: Diagnosis not present

## 2016-05-05 ENCOUNTER — Ambulatory Visit (INDEPENDENT_AMBULATORY_CARE_PROVIDER_SITE_OTHER): Payer: Federal, State, Local not specified - PPO | Admitting: Family Medicine

## 2016-05-05 ENCOUNTER — Encounter: Payer: Self-pay | Admitting: Family Medicine

## 2016-05-05 VITALS — BP 131/80 | HR 60 | Temp 97.0°F | Ht 69.0 in | Wt 344.6 lb

## 2016-05-05 DIAGNOSIS — J0101 Acute recurrent maxillary sinusitis: Secondary | ICD-10-CM | POA: Diagnosis not present

## 2016-05-05 MED ORDER — AMOXICILLIN-POT CLAVULANATE 875-125 MG PO TABS: 1.0000 | ORAL_TABLET | Freq: Two times a day (BID) | ORAL | 0 refills | Status: DC

## 2016-05-05 NOTE — Progress Notes (Signed)
   HPI  Patient presents today here with concern for sinus infection.  Patient has history of uncontrolled diabetes.  Patient explains over the last 10-12 days he said bilateral eye pressure and maxillary facial pressure along with some teeth pain, left ear pain, nasal congestion, and cough. Tolerating food and fluids normally. He had some blurred vision with hyperglycemia over the last 2-3 days which is improved today.  He is breathing normally.  He has tried nasal saline rinses and eyedrops of no improvement  PMH: Smoking status noted ROS: Per HPI  Objective: BP 131/80   Pulse 60   Temp 97 F (36.1 C) (Oral)   Ht 5\' 9"  (1.753 m)   Wt (!) 344 lb 9.6 oz (156.3 kg)   BMI 50.89 kg/m  Gen: NAD, alert, cooperative with exam HEENT: NCAT, bilateral tenderness to palpation x-ray sinuses, oropharynx clear, TMs normal bilaterally, CV: RRR, good S1/S2, no murmur Resp: CTABL, no wheezes, non-labored Ext: No edema, warm Neuro: Alert and oriented, No gross deficits  Assessment and plan:  # Maxillary sinusitis Treat with Augmentin, Supportive care discussed RTC with any concerns F/u Dm2 in 1 month    Meds ordered this encounter  Medications  . amLODipine (NORVASC) 5 MG tablet    Sig: Take 5 mg by mouth daily.  Marland Kitchen amoxicillin-clavulanate (AUGMENTIN) 875-125 MG tablet    Sig: Take 1 tablet by mouth 2 (two) times daily.    Dispense:  20 tablet    Refill:  0    Laroy Apple, MD [facility] 05/05/2016, 8:46 AM

## 2016-05-05 NOTE — Patient Instructions (Addendum)
Great to see you!  Come back in about 1 month for follow up of diabetes.   Take all antibiotics   Sinusitis, Adult Sinusitis is soreness and inflammation of your sinuses. Sinuses are hollow spaces in the bones around your face. They are located:  Around your eyes.  In the middle of your forehead.  Behind your nose.  In your cheekbones. Your sinuses and nasal passages are lined with a stringy fluid (mucus). Mucus normally drains out of your sinuses. When your nasal tissues get inflamed or swollen, the mucus can get trapped or blocked so air cannot flow through your sinuses. This lets bacteria, viruses, and funguses grow, and that leads to infection. Follow these instructions at home: Medicines  Take, use, or apply over-the-counter and prescription medicines only as told by your doctor. These may include nasal sprays.  If you were prescribed an antibiotic medicine, take it as told by your doctor. Do not stop taking the antibiotic even if you start to feel better. Hydrate and Humidify  Drink enough water to keep your pee (urine) clear or pale yellow.  Use a cool mist humidifier to keep the humidity level in your home above 50%.  Breathe in steam for 10-15 minutes, 3-4 times a day or as told by your doctor. You can do this in the bathroom while a hot shower is running.  Try not to spend time in cool or dry air. Rest  Rest as much as possible.  Sleep with your head raised (elevated).  Make sure to get enough sleep each night. General instructions  Put a warm, moist washcloth on your face 3-4 times a day or as told by your doctor. This will help with discomfort.  Wash your hands often with soap and water. If there is no soap and water, use hand sanitizer.  Do not smoke. Avoid being around people who are smoking (secondhand smoke).  Keep all follow-up visits as told by your doctor. This is important. Contact a doctor if:  You have a fever.  Your symptoms get  worse.  Your symptoms do not get better within 10 days. Get help right away if:  You have a very bad headache.  You cannot stop throwing up (vomiting).  You have pain or swelling around your face or eyes.  You have trouble seeing.  You feel confused.  Your neck is stiff.  You have trouble breathing. This information is not intended to replace advice given to you by your health care provider. Make sure you discuss any questions you have with your health care provider. Document Released: 09/23/2007 Document Revised: 12/01/2015 Document Reviewed: 01/30/2015 Elsevier Interactive Patient Education  2017 [employer].

## 2016-05-19 ENCOUNTER — Other Ambulatory Visit: Payer: Self-pay

## 2016-05-19 MED ORDER — OMEPRAZOLE 20 MG PO CPDR: 20.0000 mg | DELAYED_RELEASE_CAPSULE | Freq: Every day | ORAL | 5 refills | Status: DC

## 2016-05-28 DIAGNOSIS — J3089 Other allergic rhinitis: Secondary | ICD-10-CM | POA: Diagnosis not present

## 2016-05-28 DIAGNOSIS — J301 Allergic rhinitis due to pollen: Secondary | ICD-10-CM | POA: Diagnosis not present

## 2016-06-08 ENCOUNTER — Ambulatory Visit (INDEPENDENT_AMBULATORY_CARE_PROVIDER_SITE_OTHER): Payer: Federal, State, Local not specified - PPO | Admitting: Family Medicine

## 2016-06-08 ENCOUNTER — Encounter: Payer: Self-pay | Admitting: Family Medicine

## 2016-06-08 VITALS — BP 137/73 | HR 62 | Temp 97.6°F | Ht 69.0 in | Wt 349.2 lb

## 2016-06-08 DIAGNOSIS — E1165 Type 2 diabetes mellitus with hyperglycemia: Secondary | ICD-10-CM | POA: Diagnosis not present

## 2016-06-08 DIAGNOSIS — E669 Obesity, unspecified: Secondary | ICD-10-CM | POA: Diagnosis not present

## 2016-06-08 DIAGNOSIS — I10 Essential (primary) hypertension: Secondary | ICD-10-CM

## 2016-06-08 DIAGNOSIS — Z6841 Body Mass Index (BMI) 40.0 and over, adult: Secondary | ICD-10-CM | POA: Diagnosis not present

## 2016-06-08 DIAGNOSIS — IMO0001 Reserved for inherently not codable concepts without codable children: Secondary | ICD-10-CM

## 2016-06-08 LAB — BAYER DCA HB A1C WAIVED: HB A1C (BAYER DCA - WAIVED): 9.6 % — ABNORMAL HIGH (ref <7.0)

## 2016-06-08 MED ORDER — METFORMIN HCL 1000 MG PO TABS: 1000.0000 mg | ORAL_TABLET | Freq: Two times a day (BID) | ORAL | 3 refills | Status: AC

## 2016-06-08 MED ORDER — GLIPIZIDE 5 MG PO TABS: 5.0000 mg | ORAL_TABLET | Freq: Two times a day (BID) | ORAL | 3 refills | Status: DC

## 2016-06-08 MED ORDER — DAPAGLIFLOZIN PROPANEDIOL 5 MG PO TABS: 5.0000 mg | ORAL_TABLET | Freq: Every day | ORAL | 3 refills | Status: DC

## 2016-06-08 NOTE — Progress Notes (Signed)
   HPI  Patient presents today for follow-up chronic medical conditions.  Pt states he cannot tolerate 10 mg farxiga, taking 5 States actos 30/1000 has never been dispensed, taking 15 mg, he's been told its going up in price NO problems with metformin He feels most of his problems with blood sugar have been due to a schedule change recently, he usually walks 1 mile every day, recently has not been able to do that. Fasting blood sugars are average 1:30 to 160, no hypoglycemia No heart failure He drives a truck, cannot take injections. Not checking postprandial. Watching diet moderately, some indiscretion lately, the last few days his blood sugar has been elevated-  200-220 fasting.  Hypertension Good medication compliance  PMH: Smoking status noted ROS: Per HPI  Objective: BP 137/73   Pulse 62   Temp 97.6 F (36.4 C) (Oral)   Ht 5\' 9"  (1.753 m)   Wt (!) 349 lb 3.2 oz (158.4 kg)   BMI 51.57 kg/m  Gen: NAD, alert, cooperative with exam HEENT: NCAT CV: RRR, good S1/S2, no murmur Resp: CTABL, no wheezes, non-labored Ext: No edema, warm Neuro: Alert and oriented, No gross deficits  Assessment and plan:  # Type 2 diabetes Improving slightly, A1c now 9.6. Considering cost, changed from Actos/metformin to glipizide plus metformin Continue farxiga, 5 mg Consider victoza  # HTN Well controlled Cont amlodipine and ARB  # Obesity Severe Consider GLP Pt motivated for activity and diet changes.     Orders Placed This Encounter  Procedures  . Bayer DCA Hb A1c Waived  . Microalbumin / creatinine urine ratio    Meds ordered this encounter  Medications  . dapagliflozin propanediol (FARXIGA) 5 MG TABS tablet    Sig: Take 5 mg by mouth daily.    Dispense:  180 tablet    Refill:  3  . metFORMIN (GLUCOPHAGE) 1000 MG tablet    Sig: Take 1 tablet (1,000 mg total) by mouth 2 (two) times daily with a meal.    Dispense:  180 tablet    Refill:  3    Please dc  actos/metformin combo  . glipiZIDE (GLUCOTROL) 5 MG tablet    Sig: Take 1 tablet (5 mg total) by mouth 2 (two) times daily before a meal.    Dispense:  60 tablet    Refill:  [place], MD Tristan Schroeder [facility] 06/08/2016, 12:02 PM

## 2016-06-08 NOTE — Patient Instructions (Signed)
Great to see you!  I have changed your combo pill to glipizide + metformin, after 1 week you may increase this to 2 pills twice daily ( before meals) if needed

## 2016-06-09 LAB — MICROALBUMIN / CREATININE URINE RATIO
Creatinine, Urine: 91.6 mg/dL
Microalb/Creat Ratio: 786.9 — ABNORMAL HIGH (ref 0.0–30.0)
Microalbumin, Urine: 720.8 ug/mL

## 2016-06-10 ENCOUNTER — Encounter: Payer: Self-pay | Admitting: Family Medicine

## 2016-06-10 DIAGNOSIS — E1121 Type 2 diabetes mellitus with diabetic nephropathy: Secondary | ICD-10-CM | POA: Insufficient documentation

## 2016-06-15 DIAGNOSIS — J301 Allergic rhinitis due to pollen: Secondary | ICD-10-CM | POA: Diagnosis not present

## 2016-06-15 DIAGNOSIS — J3089 Other allergic rhinitis: Secondary | ICD-10-CM | POA: Diagnosis not present

## 2016-06-15 DIAGNOSIS — E113291 Type 2 diabetes mellitus with mild nonproliferative diabetic retinopathy without macular edema, right eye: Secondary | ICD-10-CM | POA: Diagnosis not present

## 2016-06-15 DIAGNOSIS — H353111 Nonexudative age-related macular degeneration, right eye, early dry stage: Secondary | ICD-10-CM | POA: Diagnosis not present

## 2016-06-15 LAB — HM DIABETES EYE EXAM

## 2016-06-16 ENCOUNTER — Other Ambulatory Visit: Payer: Self-pay

## 2016-06-16 ENCOUNTER — Other Ambulatory Visit: Payer: Self-pay | Admitting: Family Medicine

## 2016-06-16 MED ORDER — LOSARTAN POTASSIUM 100 MG PO TABS: 100.0000 mg | ORAL_TABLET | Freq: Every day | ORAL | 1 refills | Status: DC

## 2016-06-16 MED ORDER — OMEPRAZOLE 20 MG PO CPDR: 20.0000 mg | DELAYED_RELEASE_CAPSULE | Freq: Every day | ORAL | 6 refills | Status: DC

## 2016-06-16 MED ORDER — HYDROCHLOROTHIAZIDE 25 MG PO TABS: 25.0000 mg | ORAL_TABLET | Freq: Every day | ORAL | 1 refills | Status: DC

## 2016-06-16 NOTE — Telephone Encounter (Signed)
Patient informed that prescriptions were sent to [facility]

## 2016-06-25 ENCOUNTER — Encounter: Payer: Self-pay | Admitting: Family Medicine

## 2016-06-25 DIAGNOSIS — E11319 Type 2 diabetes mellitus with unspecified diabetic retinopathy without macular edema: Secondary | ICD-10-CM | POA: Insufficient documentation

## 2016-07-03 DIAGNOSIS — J301 Allergic rhinitis due to pollen: Secondary | ICD-10-CM | POA: Diagnosis not present

## 2016-07-03 DIAGNOSIS — J3089 Other allergic rhinitis: Secondary | ICD-10-CM | POA: Diagnosis not present

## 2016-07-24 DIAGNOSIS — J3089 Other allergic rhinitis: Secondary | ICD-10-CM | POA: Diagnosis not present

## 2016-07-24 DIAGNOSIS — J301 Allergic rhinitis due to pollen: Secondary | ICD-10-CM | POA: Diagnosis not present

## 2016-08-11 ENCOUNTER — Telehealth: Payer: Self-pay | Admitting: Family Medicine

## 2016-08-11 MED ORDER — INDOMETHACIN 50 MG PO CAPS: 50.0000 mg | ORAL_CAPSULE | Freq: Three times a day (TID) | ORAL | 0 refills | Status: DC | PRN

## 2016-08-11 NOTE — Telephone Encounter (Signed)
Medication sent to pharmacy  

## 2016-08-11 NOTE — Telephone Encounter (Signed)
What is the name of the medication? endomethicyn 50mg  for gout  Have you contacted your pharmacy to request a refill? No, bradshaw told him to call us  Which pharmacy would you like this sent to? [facility] [place]   Patient notified that their request is being sent to the clinical staff for review and that they should receive a call once it is complete. If they do not receive a call within 24 hours they can check with their pharmacy or our office.

## 2016-08-14 DIAGNOSIS — J301 Allergic rhinitis due to pollen: Secondary | ICD-10-CM | POA: Diagnosis not present

## 2016-08-14 DIAGNOSIS — J3089 Other allergic rhinitis: Secondary | ICD-10-CM | POA: Diagnosis not present

## 2016-09-04 DIAGNOSIS — J301 Allergic rhinitis due to pollen: Secondary | ICD-10-CM | POA: Diagnosis not present

## 2016-09-04 DIAGNOSIS — J3089 Other allergic rhinitis: Secondary | ICD-10-CM | POA: Diagnosis not present

## 2016-09-30 DIAGNOSIS — J3089 Other allergic rhinitis: Secondary | ICD-10-CM | POA: Diagnosis not present

## 2016-09-30 DIAGNOSIS — J301 Allergic rhinitis due to pollen: Secondary | ICD-10-CM | POA: Diagnosis not present

## 2016-10-01 ENCOUNTER — Other Ambulatory Visit: Payer: Self-pay | Admitting: Family Medicine

## 2016-10-13 DIAGNOSIS — J3089 Other allergic rhinitis: Secondary | ICD-10-CM | POA: Diagnosis not present

## 2016-10-13 DIAGNOSIS — J301 Allergic rhinitis due to pollen: Secondary | ICD-10-CM | POA: Diagnosis not present

## 2016-10-13 DIAGNOSIS — K219 Gastro-esophageal reflux disease without esophagitis: Secondary | ICD-10-CM | POA: Diagnosis not present

## 2016-10-13 DIAGNOSIS — J4520 Mild intermittent asthma, uncomplicated: Secondary | ICD-10-CM | POA: Diagnosis not present

## 2016-10-20 ENCOUNTER — Telehealth: Payer: Self-pay | Admitting: Family Medicine

## 2016-10-20 MED ORDER — INDOMETHACIN 50 MG PO CAPS: 50.0000 mg | ORAL_CAPSULE | Freq: Three times a day (TID) | ORAL | 0 refills | Status: DC | PRN

## 2016-10-20 NOTE — Telephone Encounter (Signed)
Rx sent to pharmacy   

## 2016-10-20 NOTE — Telephone Encounter (Signed)
What is the name of the medication? Gout med. 50mg . Has appt next Tuesday. indomethacin (INDOCIN) 50 MG capsule  Have you contacted your pharmacy to request a refill? Yes   Which pharmacy would you like this sent to? [facility] in [place].   Patient notified that their request is being sent to the clinical staff for review and that they should receive a call once it is complete. If they do not receive a call within 24 hours they can check with their pharmacy or our office.

## 2016-10-27 ENCOUNTER — Encounter: Payer: Self-pay | Admitting: Family Medicine

## 2016-10-27 ENCOUNTER — Ambulatory Visit (INDEPENDENT_AMBULATORY_CARE_PROVIDER_SITE_OTHER): Payer: Federal, State, Local not specified - PPO | Admitting: Family Medicine

## 2016-10-27 VITALS — BP 137/78 | HR 56 | Temp 97.9°F | Ht 69.0 in | Wt 354.4 lb

## 2016-10-27 DIAGNOSIS — I10 Essential (primary) hypertension: Secondary | ICD-10-CM | POA: Diagnosis not present

## 2016-10-27 DIAGNOSIS — Z125 Encounter for screening for malignant neoplasm of prostate: Secondary | ICD-10-CM | POA: Diagnosis not present

## 2016-10-27 DIAGNOSIS — E1165 Type 2 diabetes mellitus with hyperglycemia: Secondary | ICD-10-CM

## 2016-10-27 LAB — BAYER DCA HB A1C WAIVED: HB A1C: 8.4 % — AB (ref <7.0)

## 2016-10-27 MED ORDER — GLIPIZIDE 5 MG PO TABS: 5.0000 mg | ORAL_TABLET | Freq: Two times a day (BID) | ORAL | 3 refills | Status: DC

## 2016-10-27 MED ORDER — AMLODIPINE BESYLATE 5 MG PO TABS: 5.0000 mg | ORAL_TABLET | Freq: Every day | ORAL | 3 refills | Status: DC

## 2016-10-27 NOTE — Patient Instructions (Signed)
Great to see you!  Lets see you again in 3 month sunless you need [place] sooner.

## 2016-10-27 NOTE — Progress Notes (Signed)
   HPI  Patient presents today here to follow-up for chronic medical conditions.  No complaints today. Hypertension Good medication compliance, no chest pain.  Diabetes Good medication compliance, states that fasting blood sugars are generally well controlled at 120-140. 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 Patient states that he has had severe dietary indiscretions over the last few weeks.    PMH: Smoking status noted ROS: Per HPI  Objective: BP 137/78   Pulse (!) 56   Temp 97.9 F (36.6 C) (Oral)   Ht '5\' 9"'$  (1.753 m)   Wt (!) 354 lb 6.4 oz (160.8 kg)   BMI 52.34 kg/m  Gen: NAD, alert, cooperative with exam HEENT: NCAT, EOMI, PERRL CV: RRR, good S1/S2, no murmur Resp: CTABL, no wheezes, non-labored Ext: No edema, warm Neuro: Alert and oriented, No gross deficits  Diabetic Foot Exam - Simple   Simple Foot Form Diabetic Foot exam was performed with the following findings:  Yes 10/27/2016 11:48 AM  Visual Inspection No deformities, no ulcerations, no other skin breakdown bilaterally:  Yes Sensation Testing Intact to touch and monofilament testing bilaterally:  Yes Pulse Check Posterior Tibialis and Dorsalis pulse intact bilaterally:  Yes Comments Thick calluses on bilateral heels, no pre-ulcerative lesions, he does have some pre-fissure formation      Assessment and plan:  # Type 2 diabetes Uncontrolled, A1c improved from 9.6-8.4 over the last 3 months, however he reports an A1c of 7.3 about one month ago. No changes to medications, offered GLP Discussed risk of extremity complications with farxiga  # Hypertension Well-controlled current medications, no change Labs  # Screening for prostate cancer Checking PSA today.     Orders Placed This Encounter  Procedures  . Bayer DCA Hb A1c Waived  . CMP14+EGFR  . CBC with Differential/Platelet  . Lipid panel  . TSH  . PSA    Meds ordered this  encounter  Medications  . amLODipine (NORVASC) 5 MG tablet    Sig: Take 1 tablet (5 mg total) by mouth daily.    Dispense:  90 tablet    Refill:  3  . glipiZIDE (GLUCOTROL) 5 MG tablet    Sig: Take 1 tablet (5 mg total) by mouth 2 (two) times daily before a meal.    Dispense:  180 tablet    Refill:  [place], MD Tristan Schroeder [facility] 10/27/2016, 11:50 AM

## 2016-10-28 LAB — PSA: PROSTATE SPECIFIC AG, SERUM: 3 ng/mL (ref 0.0–4.0)

## 2016-10-28 LAB — CBC WITH DIFFERENTIAL/PLATELET
BASOS: 0 %
Basophils Absolute: 0 10*3/uL (ref 0.0–0.2)
EOS (ABSOLUTE): 0.3 10*3/uL (ref 0.0–0.4)
EOS: 7 %
HEMATOCRIT: 44.3 % (ref 37.5–51.0)
Hemoglobin: 14.8 g/dL (ref 13.0–17.7)
IMMATURE GRANULOCYTES: 0 %
Immature Grans (Abs): 0 10*3/uL (ref 0.0–0.1)
Lymphocytes Absolute: 1.6 10*3/uL (ref 0.7–3.1)
Lymphs: 31 %
MCH: 29.1 pg (ref 26.6–33.0)
MCHC: 33.4 g/dL (ref 31.5–35.7)
MCV: 87 fL (ref 79–97)
MONOS ABS: 0.4 10*3/uL (ref 0.1–0.9)
Monocytes: 9 %
NEUTROS ABS: 2.7 10*3/uL (ref 1.4–7.0)
NEUTROS PCT: 53 %
PLATELETS: 133 10*3/uL — AB (ref 150–379)
RBC: 5.09 x10E6/uL (ref 4.14–5.80)
RDW: 13.8 % (ref 12.3–15.4)
WBC: 5 10*3/uL (ref 3.4–10.8)

## 2016-10-28 LAB — TSH: TSH: 0.968 u[IU]/mL (ref 0.450–4.500)

## 2016-10-28 LAB — LIPID PANEL
CHOL/HDL RATIO: 3.5 ratio (ref 0.0–5.0)
Cholesterol, Total: 137 mg/dL (ref 100–199)
HDL: 39 mg/dL — AB (ref >39)
LDL Calculated: 57 mg/dL (ref 0–99)
Triglycerides: 206 mg/dL — ABNORMAL HIGH (ref 0–149)
VLDL CHOLESTEROL CAL: 41 mg/dL — AB (ref 5–40)

## 2016-10-28 LAB — CMP14+EGFR
ALT: 83 IU/L — AB (ref 0–44)
AST: 57 IU/L — AB (ref 0–40)
Albumin/Globulin Ratio: 1.9 (ref 1.2–2.2)
Albumin: 4.3 g/dL (ref 3.6–4.8)
Alkaline Phosphatase: 77 IU/L (ref 39–117)
BUN/Creatinine Ratio: 13 (ref 10–24)
BUN: 14 mg/dL (ref 8–27)
Bilirubin Total: 0.5 mg/dL (ref 0.0–1.2)
CALCIUM: 9.4 mg/dL (ref 8.6–10.2)
CO2: 20 mmol/L (ref 20–29)
CREATININE: 1.09 mg/dL (ref 0.76–1.27)
Chloride: 102 mmol/L (ref 96–106)
GFR calc Af Amer: 85 mL/min/{1.73_m2} (ref >59)
GFR, EST NON AFRICAN AMERICAN: 73 mL/min/{1.73_m2} (ref >59)
GLUCOSE: 190 mg/dL — AB (ref 65–99)
Globulin, Total: 2.3 g/dL (ref 1.5–4.5)
Potassium: 3.9 mmol/L (ref 3.5–5.2)
Sodium: 141 mmol/L (ref 134–144)
Total Protein: 6.6 g/dL (ref 6.0–8.5)

## 2016-10-30 ENCOUNTER — Other Ambulatory Visit: Payer: Self-pay

## 2016-10-30 MED ORDER — GLIPIZIDE ER 5 MG PO TB24: 5.0000 mg | ORAL_TABLET | Freq: Every day | ORAL | 1 refills | Status: DC

## 2016-11-06 DIAGNOSIS — J3089 Other allergic rhinitis: Secondary | ICD-10-CM | POA: Diagnosis not present

## 2016-11-06 DIAGNOSIS — J301 Allergic rhinitis due to pollen: Secondary | ICD-10-CM | POA: Diagnosis not present

## 2016-11-09 DIAGNOSIS — J3089 Other allergic rhinitis: Secondary | ICD-10-CM | POA: Diagnosis not present

## 2016-11-09 DIAGNOSIS — J301 Allergic rhinitis due to pollen: Secondary | ICD-10-CM | POA: Diagnosis not present

## 2016-11-24 DIAGNOSIS — J3089 Other allergic rhinitis: Secondary | ICD-10-CM | POA: Diagnosis not present

## 2016-11-24 DIAGNOSIS — J301 Allergic rhinitis due to pollen: Secondary | ICD-10-CM | POA: Diagnosis not present

## 2016-12-01 ENCOUNTER — Other Ambulatory Visit: Payer: Self-pay | Admitting: Family Medicine

## 2016-12-17 DIAGNOSIS — J301 Allergic rhinitis due to pollen: Secondary | ICD-10-CM | POA: Diagnosis not present

## 2016-12-17 DIAGNOSIS — J3089 Other allergic rhinitis: Secondary | ICD-10-CM | POA: Diagnosis not present

## 2016-12-23 ENCOUNTER — Encounter: Payer: Self-pay | Admitting: Family

## 2016-12-23 ENCOUNTER — Ambulatory Visit (INDEPENDENT_AMBULATORY_CARE_PROVIDER_SITE_OTHER): Payer: Federal, State, Local not specified - PPO | Admitting: Family

## 2016-12-23 VITALS — BP 132/87 | HR 97 | Temp 98.7°F | Ht 69.0 in | Wt 342.6 lb

## 2016-12-23 DIAGNOSIS — J0100 Acute maxillary sinusitis, unspecified: Secondary | ICD-10-CM

## 2016-12-23 MED ORDER — AMOXICILLIN-POT CLAVULANATE 875-125 MG PO TABS: 1.0000 | ORAL_TABLET | Freq: Two times a day (BID) | ORAL | 0 refills | Status: DC

## 2016-12-23 NOTE — Patient Instructions (Signed)

## 2016-12-23 NOTE — Progress Notes (Signed)
   Subjective:    Patient ID: Ryan Jones, male    DOB: 1955/09/12, 61 y.o.   MRN: [id number]  Sinus Problem  This is a new problem. The current episode started in the past 7 days. The problem has been gradually worsening since onset. There has been no fever. His pain is at a severity of 8/10. The pain is moderate. Associated symptoms include chills, congestion, coughing, ear pain, headaches, a hoarse voice, shortness of breath, sinus pressure, sneezing, a sore throat and swollen glands. Past treatments include oral decongestants and acetaminophen. The treatment provided mild relief.  Cough  Associated symptoms include chills, ear pain, headaches, a sore throat and shortness of breath.      Review of Systems  Constitutional: Positive for chills.  HENT: Positive for congestion, ear pain, hoarse voice, sinus pressure, sneezing and sore throat.   Respiratory: Positive for cough and shortness of breath.   Neurological: Positive for headaches.  All other systems reviewed and are negative.      Objective:   Physical Exam  Constitutional: He is oriented to person, place, and time. He appears well-developed and well-nourished. No distress.  HENT:  Head: Normocephalic.  Right Ear: External ear normal.  Left Ear: External ear normal.  Nose: Mucosal edema and rhinorrhea present. Right sinus exhibits maxillary sinus tenderness and frontal sinus tenderness. Left sinus exhibits maxillary sinus tenderness and frontal sinus tenderness.  Mouth/Throat: Posterior oropharyngeal erythema present.  Eyes: Pupils are equal, round, and reactive to light. Right eye exhibits no discharge. Left eye exhibits no discharge.  Neck: Normal range of motion. Neck supple. No thyromegaly present.  Cardiovascular: Normal rate, regular rhythm, normal heart sounds and intact distal pulses.   No murmur heard. Pulmonary/Chest: Effort normal. No respiratory distress. He has wheezes.  Abdominal: Soft. Bowel sounds are  normal. He exhibits no distension. There is no tenderness.  Musculoskeletal: Normal range of motion. He exhibits no edema or tenderness.  Neurological: He is alert and oriented to person, place, and time. He has normal reflexes. No cranial nerve deficit.  Skin: Skin is warm and dry. No rash noted. No erythema.  Psychiatric: He has a normal mood and affect. His behavior is normal. Judgment and thought content normal.  Vitals reviewed.     BP 132/87   Pulse 97   Temp 98.7 F (37.1 C) (Oral)   Ht 5\' 9"  (1.753 m)   Wt (!) 342 lb 9.6 oz (155.4 kg)   BMI 50.59 kg/m      Assessment & Plan:  1. Acute maxillary sinusitis, recurrence not specified - Take meds as prescribed - Use a cool mist humidifier  -Use saline nose sprays frequently -Saline irrigations of the nose can be very helpful if done frequently.  * 4X daily for 1 week*  * Use of a nettie pot can be helpful with this. Follow directions with this* -Force fluids -For any cough or congestion  Use plain Mucinex- regular strength or max strength is fine -For fever or aces or pains- take tylenol or ibuprofen -Throat lozenges if help - amoxicillin-clavulanate (AUGMENTIN) 875-125 MG tablet; Take 1 tablet by mouth 2 (two) times daily.  Dispense: 14 tablet; Refill: 0   Evelina Dun, FNP

## 2016-12-24 ENCOUNTER — Other Ambulatory Visit: Payer: Self-pay | Admitting: *Deleted

## 2016-12-24 ENCOUNTER — Telehealth: Payer: Self-pay | Admitting: Family Medicine

## 2016-12-24 MED ORDER — GLIPIZIDE 5 MG PO TABS
5.0000 mg | ORAL_TABLET | Freq: Two times a day (BID) | ORAL | 3 refills | Status: DC
Start: 2016-12-24 — End: 2017-07-30

## 2016-12-25 NOTE — Telephone Encounter (Signed)
Standard release, I do not intend for any changes from what he has been treated with for months.   Laroy Apple, MD [facility] 12/25/2016, 8:27 AM

## 2016-12-25 NOTE — Telephone Encounter (Signed)
Pharmacy notified no change in therapy

## 2017-01-07 DIAGNOSIS — J301 Allergic rhinitis due to pollen: Secondary | ICD-10-CM | POA: Diagnosis not present

## 2017-01-07 DIAGNOSIS — J3089 Other allergic rhinitis: Secondary | ICD-10-CM | POA: Diagnosis not present

## 2017-01-10 ENCOUNTER — Other Ambulatory Visit: Payer: Self-pay | Admitting: Family Medicine

## 2017-01-11 ENCOUNTER — Emergency Department (HOSPITAL_COMMUNITY)
Admission: EM | Admit: 2017-01-11 | Discharge: 2017-01-11 | Disposition: A | Discharge status: Home / Self Care | Payer: Federal, State, Local not specified - PPO | Attending: Emergency Medicine | Admitting: Emergency Medicine

## 2017-01-11 ENCOUNTER — Encounter (HOSPITAL_COMMUNITY): Payer: Self-pay | Admitting: Emergency Medicine

## 2017-01-11 DIAGNOSIS — S8992XA Unspecified injury of left lower leg, initial encounter: Secondary | ICD-10-CM | POA: Diagnosis not present

## 2017-01-11 DIAGNOSIS — F17220 Nicotine dependence, chewing tobacco, uncomplicated: Secondary | ICD-10-CM | POA: Insufficient documentation

## 2017-01-11 DIAGNOSIS — Y939 Activity, unspecified: Secondary | ICD-10-CM | POA: Diagnosis not present

## 2017-01-11 DIAGNOSIS — E11319 Type 2 diabetes mellitus with unspecified diabetic retinopathy without macular edema: Secondary | ICD-10-CM | POA: Insufficient documentation

## 2017-01-11 DIAGNOSIS — W208XXA Other cause of strike by thrown, projected or falling object, initial encounter: Secondary | ICD-10-CM | POA: Diagnosis not present

## 2017-01-11 DIAGNOSIS — Z85828 Personal history of other malignant neoplasm of skin: Secondary | ICD-10-CM | POA: Diagnosis not present

## 2017-01-11 DIAGNOSIS — I10 Essential (primary) hypertension: Secondary | ICD-10-CM | POA: Insufficient documentation

## 2017-01-11 DIAGNOSIS — Y999 Unspecified external cause status: Secondary | ICD-10-CM | POA: Insufficient documentation

## 2017-01-11 DIAGNOSIS — S80812A Abrasion, left lower leg, initial encounter: Secondary | ICD-10-CM | POA: Diagnosis not present

## 2017-01-11 DIAGNOSIS — Z7984 Long term (current) use of oral hypoglycemic drugs: Secondary | ICD-10-CM | POA: Insufficient documentation

## 2017-01-11 DIAGNOSIS — I8392 Asymptomatic varicose veins of left lower extremity: Secondary | ICD-10-CM | POA: Insufficient documentation

## 2017-01-11 DIAGNOSIS — M79605 Pain in left leg: Secondary | ICD-10-CM | POA: Diagnosis not present

## 2017-01-11 DIAGNOSIS — Y929 Unspecified place or not applicable: Secondary | ICD-10-CM | POA: Insufficient documentation

## 2017-01-11 DIAGNOSIS — E1121 Type 2 diabetes mellitus with diabetic nephropathy: Secondary | ICD-10-CM | POA: Diagnosis not present

## 2017-01-11 DIAGNOSIS — I83899 Varicose veins of unspecified lower extremities with other complications: Secondary | ICD-10-CM

## 2017-01-11 MED ORDER — TETANUS-DIPHTH-ACELL PERTUSSIS 5-2.5-18.5 LF-MCG/0.5 IM SUSP
0.5000 mL | Freq: Once | INTRAMUSCULAR | Status: AC
  Administered 2017-01-11: 0.5 mL via INTRAMUSCULAR
  Filled 2017-01-11: qty 0.5

## 2017-01-11 NOTE — ED Provider Notes (Signed)
[facility] DEPT Provider Note   CSN: [id number] Arrival date & time: 01/11/17  1652     History   Chief Complaint Chief Complaint  Patient presents with  . Puncture Wound    HPI Ryan Jones is a 61 y.o. male.  HPI Ryan Jones is a 61 y.o. male with a history of atrial flutter, hypertension, diabetes, presents to emergency department complaining of bleeding wound to the left lower leg. Patient states he was bleeding and a stick flew from there we treated her and hit him on the left shin. He reports "pool of blood from a punctured varicose vein." He states "it was squirting everywhere." EMS was called who applied pressure dressing. Bleeding is currently controlled. Patient denies any numbness or weakness distal to the wound. He denies knowing his last tetanus shot. He denies any other injuries. He is not on any blood thinners. Denies pain.  Past Medical History:  Diagnosis Date  . Arthritis   . Atrial flutter ([place]) 02/26/11   s/p RFCA 02/2011  . Bronchitis    h/o recurrent bronchitis; "usually get it 1-2X/wy; last time 12/2010"  . 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 Constipation   . Diabetes mellitus    "pills"  . Diarrhea   . GERD (gastroesophageal reflux disease)   . Gout    "get it in my hands and feet"  . Gout   . Hemorrhoids   . Hypertension   . Kidney stone    1999  . Seasonal allergies 02/26/11    "I take an allergy shot q week"  . Sleep apnea    no cpap usesd    Patient Active Problem List   Diagnosis Date Noted  . Diabetic retinopathy ([place]) 06/25/2016  . Diabetic nephropathy ([place]) 06/10/2016  . Diabetes mellitus, type 2 ([place]) 12/10/2014  . Chest pain, atypical 05/25/2011  . Obesity 04/03/2011  . Essential hypertension, benign 05/15/2008    Past Surgical History:  Procedure Laterality Date  . ATRIAL FLUTTER ABLATION N/A 02/26/2011   Procedure: ATRIAL FLUTTER ABLATION;  Surgeon: Deboraha Sprang, MD;  Location[facility] CATH LAB;  Service:  Cardiovascular;  Laterality: N/A;  . BACK SURGERY  before 1995 and in 1995;    "ruptured disc repaired; lower back"  . CARDIAC ELECTROPHYSIOLOGY STUDY AND ABLATION  02/26/11  . COLONOSCOPY WITH PROPOFOL N/A 01/18/2015   Procedure: COLONOSCOPY WITH PROPOFOL;  Surgeon: Carol Ada, MD;  Location: [facility] ENDOSCOPY;  Service: Endoscopy;  Laterality: N/A;  . LUMBAR [place]  ~ 1993   "put foam in back; in place of disc"  . LUMBAR DISC SURGERY  04/1993   "replaced gel foam that had blown out"  . SKIN CANCER EXCISION  2011   right ear       Home Medications    Prior to Admission medications   Medication Sig Start Date End Date Taking? Authorizing Provider  albuterol (PROVENTIL HFA;VENTOLIN HFA) 108 (90 Base) MCG/ACT inhaler Inhale 2 puffs into the lungs every 6 (six) hours as needed. For shortness of breath. 06/24/15   Wardell Honour, MD  amLODipine (NORVASC) 5 MG tablet Take 1 tablet (5 mg total) by mouth daily. 10/27/16   Timmothy Euler, MD  amoxicillin-clavulanate (AUGMENTIN) 875-125 MG tablet Take 1 tablet by mouth 2 (two) times daily. 12/23/16   Sharion Balloon, FNP  FARXIGA 5 MG TABS tablet TAKE 2 TABLETS BY MOUTH EVERY DAY 12/01/16   Timmothy Euler, MD  glipiZIDE ([place]) 5  MG tablet Take 1 tablet (5 mg total) by mouth 2 (two) times daily before a meal. 12/24/16   Timmothy Euler, MD  hydrochlorothiazide (HYDRODIURIL) 25 MG tablet Take 1 tablet (25 mg total) by mouth daily. 06/16/16   Timmothy Euler, MD  indomethacin (INDOCIN) 50 MG capsule Take 1 capsule (50 mg total) by mouth 3 (three) times daily as needed for moderate pain (gout). 10/20/16   Timmothy Euler, MD  losartan (COZAAR) 100 MG tablet Take 1 tablet (100 mg total) by mouth daily. 06/16/16   Timmothy Euler, MD  metFORMIN (GLUCOPHAGE) 1000 MG tablet Take 1 tablet (1,000 mg total) by mouth 2 (two) times daily with a meal. 06/08/16   Timmothy Euler, MD  mometasone (NASONEX) 50 MCG/ACT nasal spray 2 sprays each  nares once daily 07/26/15   Timmothy Euler, MD  Multiple Vitamin (MULTIVITAMIN) capsule Take 1 capsule by mouth daily.      [provider]  NON FORMULARY once a week. Allergy shot    [provider]  omeprazole (PRILOSEC) 20 MG capsule TAKE 1 CAPSULE BY MOUTH EVERY DAY 01/11/17   Timmothy Euler, MD    Family History No family history on file.  Social History Social History  Substance Use Topics  . Smoking status: Never Smoker  . Smokeless tobacco: Current User    Types: Snuff     Comment: "stopped cigars ~ 2002"  . Alcohol use No     Allergies   Amoxicillin-pot clavulanate and Codeine   Review of Systems Review of Systems  Constitutional: Negative for chills and fever.  Musculoskeletal: Positive for arthralgias.  Skin: Positive for wound.  Neurological: Negative for weakness and numbness.  Hematological: Does not bruise/bleed easily.     Physical Exam Updated Vital Signs BP 131/77 (BP Location: Left Arm)   Pulse 89   Temp 98.2 F (36.8 C) (Oral)   Resp 20   Ht 5\' 9"  (1.753 m)   Wt (!) 155.1 kg (342 lb)   SpO2 97%   BMI 50.50 kg/m   Physical Exam  Constitutional: He is oriented to person, place, and time. He appears well-developed and well-nourished. No distress.  Eyes: Conjunctivae are normal.  Neck: Neck supple.  Cardiovascular: Normal rate.   Pulmonary/Chest: No respiratory distress.  Abdominal: He exhibits no distension.  Neurological: He is alert and oriented to person, place, and time.  Skin: Skin is warm and dry.  Tiny, less than 1 cm abrasion to the mid anterior left shin, over the varicose vein. Currently hemostatic. No foreign body palpated. Dorsal pedal pulses are intact and equal bilaterally. Capillary refill less than 2 seconds distally.  Nursing note and vitals reviewed.    ED Treatments / Results  Labs (all labs ordered are listed, but only abnormal results are displayed) Labs Reviewed - No data to display  EKG   EKG Interpretation None       Radiology No results found.  Procedures Procedures (including critical care time)  Medications Ordered in ED Medications  Tdap (BOOSTRIX) injection 0.5 mL (not administered)     Initial Impression / Assessment and Plan / ED Course  I have reviewed the triage vital signs and the nursing notes.  Pertinent labs & imaging results that were available during my care of the patient were reviewed by me and considered in my medical decision making (see chart for details).     Patient with very small abrasion to the left anterior shin, right over  a varicose vein. Pressure was applied and held for approximately 1 hour now. Wound is hemostatic. No active bleeding after dressing removed. Surgicel applied to prevent any further bleeding after discharge. Wound carefully cleaned with saline and irrigated. Tetanus was updated. Pressure dressing applied again. Will discharge home, remove dressing in 24 hours. Follow-up as needed. Return precautions discussed. Patient denies any dizziness or lightheadedness, do not think he needs any further testing in ED.   Vitals:   01/11/17 1659 01/11/17 1702  BP: 131/77   Pulse: 89   Resp: 20   Temp: 98.2 F (36.8 C)   TempSrc: Oral   SpO2: 97%   Weight:  (!) 155.1 kg (342 lb)  Height:  5\' 9"  (1.753 m)     Final Clinical Impressions(s) / ED Diagnoses   Final diagnoses:  Bleeding from varicose vein  Abrasion, left lower leg, initial encounter    New Prescriptions New Prescriptions   No medications on file     Jeannett Senior, Hershal Coria 01/11/17 [place], Joseph, MD 01/11/17 2108

## 2017-01-11 NOTE — Discharge Instructions (Signed)
Keep the pressure dressing on for 24 hours. Clean leg normally starting tomorrow. Avoid any scrubbing. Follow-up as needed.

## 2017-01-11 NOTE — ED Triage Notes (Addendum)
Patient brought in via EMS. Patient was weed eating and through stick into lower left leg. Patient states that in punctured varicose vein and he had difficulty stopping the bleed. Patient has pressure dressing on from EMS. Dressing clean and dry at this time. Patient denies taking any type of blood thinner. Per EMS patient's blood sugar 246.

## 2017-01-17 ENCOUNTER — Other Ambulatory Visit: Payer: Self-pay | Admitting: Family Medicine

## 2017-01-22 DIAGNOSIS — J301 Allergic rhinitis due to pollen: Secondary | ICD-10-CM | POA: Diagnosis not present

## 2017-01-22 DIAGNOSIS — J3089 Other allergic rhinitis: Secondary | ICD-10-CM | POA: Diagnosis not present

## 2017-01-31 ENCOUNTER — Other Ambulatory Visit: Payer: Self-pay | Admitting: Family Medicine

## 2017-02-02 ENCOUNTER — Ambulatory Visit: Payer: Federal, State, Local not specified - PPO | Admitting: Family Medicine

## 2017-02-08 ENCOUNTER — Encounter: Payer: Self-pay | Admitting: Family Medicine

## 2017-02-08 ENCOUNTER — Ambulatory Visit (INDEPENDENT_AMBULATORY_CARE_PROVIDER_SITE_OTHER): Payer: Federal, State, Local not specified - PPO | Admitting: Family Medicine

## 2017-02-08 VITALS — BP 151/89 | HR 92 | Temp 98.1°F | Ht 69.0 in | Wt 340.2 lb

## 2017-02-08 DIAGNOSIS — I10 Essential (primary) hypertension: Secondary | ICD-10-CM | POA: Diagnosis not present

## 2017-02-08 DIAGNOSIS — E1165 Type 2 diabetes mellitus with hyperglycemia: Secondary | ICD-10-CM

## 2017-02-08 DIAGNOSIS — B3742 Candidal balanitis: Secondary | ICD-10-CM

## 2017-02-08 LAB — BAYER DCA HB A1C WAIVED: HB A1C: 10.7 % — AB (ref <7.0)

## 2017-02-08 MED ORDER — HYDROCHLOROTHIAZIDE 25 MG PO TABS: 25.0000 mg | ORAL_TABLET | Freq: Every day | ORAL | 3 refills | Status: DC

## 2017-02-08 MED ORDER — FLUCONAZOLE 150 MG PO TABS: ORAL_TABLET | ORAL | 0 refills | Status: DC

## 2017-02-08 MED ORDER — LINAGLIPTIN 5 MG PO TABS: 5.0000 mg | ORAL_TABLET | Freq: Every day | ORAL | 3 refills | Status: DC

## 2017-02-08 NOTE — Progress Notes (Addendum)
   HPI  Patient presents today to follow-up for diabetes, also with complaint of yeast infection.  Hypertension Good medication compliance, blood pressure is up he feels due to balanitis.  Patient has history of balanitis and response to Augmentin previously.  He was given Augmentin for bronchitis and developed erythema.  This combined with his diabetic medicine,farxiga, caused severe symptoms.  When he stopped the diabetic medication he got better, when he restarted it got worse.  Diabetes is very uncontrolled currently.  His average fasting blood sugar is 250-300.  PMH: Smoking status noted ROS: Per HPI  Objective: BP (!) 151/89   Pulse 92   Temp 98.1 F (36.7 C) (Oral)   Ht '5\' 9"'$  (1.753 m)   Wt (!) 340 lb 3.2 oz (154.3 kg)   BMI 50.24 kg/m  Gen: NAD, alert, cooperative with exam HEENT: NCAT, EOMI, PERRL CV: RRR, good S1/S2, no murmur Resp: CTABL, no wheezes, non-labored Ext: No edema, warm Neuro: Alert and oriented, No gross deficits GU:  Erythematous glans of the penis  Diabetic Foot Exam - Simple   Simple Foot Form Diabetic Foot exam was performed with the following findings:  Yes 02/08/2017 11:43 AM  Visual Inspection No deformities, no ulcerations, no other skin breakdown bilaterally:  Yes Sensation Testing Intact to touch and monofilament testing bilaterally:  Yes Pulse Check Posterior Tibialis and Dorsalis pulse intact bilaterally:  Yes Comments      Assessment and plan:  #Type 2 diabetes Uncontrolled, A1c 10.7. Continue current medications, glipizide plus metformin plus Farxiga Add tradjenta Discussed Endo, diet. Discussed not many ways out except insulin or drastic diet change. 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   #Hypertension Slightly elevated today Continue current medications, need refill. Labs  #Candidal balanitis Treat with Diflucan, continue Monistat Discussed secondary to [place] This Encounter  Procedures    . Bayer DCA Hb A1c Waived  . CMP14+EGFR    Meds ordered this encounter  Medications  . hydrochlorothiazide (HYDRODIURIL) 25 MG tablet    Sig: Take 1 tablet (25 mg total) by mouth daily.    Dispense:  90 tablet    Refill:  3  . linagliptin (TRADJENTA) 5 MG TABS tablet    Sig: Take 1 tablet (5 mg total) by mouth daily.    Dispense:  30 tablet    Refill:  3  . fluconazole (DIFLUCAN) 150 MG tablet    Sig: Take one pill and repeat in 1 week    Dispense:  2 tablet    Refill:  0    Laroy Apple, MD Tristan Schroeder [facility] 02/08/2017, 11:44 AM

## 2017-02-08 NOTE — Patient Instructions (Signed)
Great to see you!  Come back in 3 months unless you need [place] sooner.   Start tradjenta 1 pill once daily.   Take diflucan 1 pill and repeat in 1 week for the yeast.

## 2017-02-09 LAB — CMP14+EGFR
A/G RATIO: 2.6 — AB (ref 1.2–2.2)
ALT: 109 IU/L — AB (ref 0–44)
AST: 97 IU/L — AB (ref 0–40)
Albumin: 4.6 g/dL (ref 3.6–4.8)
Alkaline Phosphatase: 97 IU/L (ref 39–117)
BILIRUBIN TOTAL: 0.4 mg/dL (ref 0.0–1.2)
BUN/Creatinine Ratio: 13 (ref 10–24)
BUN: 15 mg/dL (ref 8–27)
CHLORIDE: 98 mmol/L (ref 96–106)
CO2: 23 mmol/L (ref 20–29)
Calcium: 9.9 mg/dL (ref 8.6–10.2)
Creatinine, Ser: 1.16 mg/dL (ref 0.76–1.27)
GFR calc Af Amer: 79 mL/min/{1.73_m2} (ref >59)
GFR calc non Af Amer: 68 mL/min/{1.73_m2} (ref >59)
Globulin, Total: 1.8 g/dL (ref 1.5–4.5)
Glucose: 410 mg/dL (ref 65–99)
POTASSIUM: 4.2 mmol/L (ref 3.5–5.2)
Sodium: 137 mmol/L (ref 134–144)
Total Protein: 6.4 g/dL (ref 6.0–8.5)

## 2017-02-16 ENCOUNTER — Other Ambulatory Visit: Payer: Self-pay

## 2017-02-16 DIAGNOSIS — R7309 Other abnormal glucose: Secondary | ICD-10-CM

## 2017-02-19 DIAGNOSIS — J3089 Other allergic rhinitis: Secondary | ICD-10-CM | POA: Diagnosis not present

## 2017-02-19 DIAGNOSIS — J301 Allergic rhinitis due to pollen: Secondary | ICD-10-CM | POA: Diagnosis not present

## 2017-03-01 DIAGNOSIS — J301 Allergic rhinitis due to pollen: Secondary | ICD-10-CM | POA: Diagnosis not present

## 2017-03-01 DIAGNOSIS — I10 Essential (primary) hypertension: Secondary | ICD-10-CM | POA: Diagnosis not present

## 2017-03-01 DIAGNOSIS — E119 Type 2 diabetes mellitus without complications: Secondary | ICD-10-CM | POA: Diagnosis not present

## 2017-03-01 DIAGNOSIS — J3089 Other allergic rhinitis: Secondary | ICD-10-CM | POA: Diagnosis not present

## 2017-03-01 DIAGNOSIS — M109 Gout, unspecified: Secondary | ICD-10-CM | POA: Diagnosis not present

## 2017-03-01 DIAGNOSIS — B49 Unspecified mycosis: Secondary | ICD-10-CM | POA: Diagnosis not present

## 2017-03-02 DIAGNOSIS — I10 Essential (primary) hypertension: Secondary | ICD-10-CM | POA: Diagnosis not present

## 2017-03-02 DIAGNOSIS — E119 Type 2 diabetes mellitus without complications: Secondary | ICD-10-CM | POA: Diagnosis not present

## 2017-03-08 DIAGNOSIS — K760 Fatty (change of) liver, not elsewhere classified: Secondary | ICD-10-CM | POA: Diagnosis not present

## 2017-03-08 DIAGNOSIS — E6609 Other obesity due to excess calories: Secondary | ICD-10-CM | POA: Diagnosis not present

## 2017-04-23 DIAGNOSIS — J301 Allergic rhinitis due to pollen: Secondary | ICD-10-CM | POA: Diagnosis not present

## 2017-04-23 DIAGNOSIS — J3089 Other allergic rhinitis: Secondary | ICD-10-CM | POA: Diagnosis not present

## 2017-04-30 DIAGNOSIS — J301 Allergic rhinitis due to pollen: Secondary | ICD-10-CM | POA: Diagnosis not present

## 2017-04-30 DIAGNOSIS — J3089 Other allergic rhinitis: Secondary | ICD-10-CM | POA: Diagnosis not present

## 2017-05-13 DIAGNOSIS — J301 Allergic rhinitis due to pollen: Secondary | ICD-10-CM | POA: Diagnosis not present

## 2017-05-13 DIAGNOSIS — J3089 Other allergic rhinitis: Secondary | ICD-10-CM | POA: Diagnosis not present

## 2017-05-28 DIAGNOSIS — J3089 Other allergic rhinitis: Secondary | ICD-10-CM | POA: Diagnosis not present

## 2017-05-28 DIAGNOSIS — J301 Allergic rhinitis due to pollen: Secondary | ICD-10-CM | POA: Diagnosis not present

## 2017-05-31 DIAGNOSIS — J019 Acute sinusitis, unspecified: Secondary | ICD-10-CM | POA: Diagnosis not present

## 2017-05-31 DIAGNOSIS — J101 Influenza due to other identified influenza virus with other respiratory manifestations: Secondary | ICD-10-CM | POA: Diagnosis not present

## 2017-06-11 DIAGNOSIS — J301 Allergic rhinitis due to pollen: Secondary | ICD-10-CM | POA: Diagnosis not present

## 2017-06-11 DIAGNOSIS — J3089 Other allergic rhinitis: Secondary | ICD-10-CM | POA: Diagnosis not present

## 2017-06-25 DIAGNOSIS — J301 Allergic rhinitis due to pollen: Secondary | ICD-10-CM | POA: Diagnosis not present

## 2017-06-25 DIAGNOSIS — J3089 Other allergic rhinitis: Secondary | ICD-10-CM | POA: Diagnosis not present

## 2017-07-02 DIAGNOSIS — J3089 Other allergic rhinitis: Secondary | ICD-10-CM | POA: Diagnosis not present

## 2017-07-02 DIAGNOSIS — E113291 Type 2 diabetes mellitus with mild nonproliferative diabetic retinopathy without macular edema, right eye: Secondary | ICD-10-CM | POA: Diagnosis not present

## 2017-07-02 DIAGNOSIS — H40053 Ocular hypertension, bilateral: Secondary | ICD-10-CM | POA: Diagnosis not present

## 2017-07-02 DIAGNOSIS — J301 Allergic rhinitis due to pollen: Secondary | ICD-10-CM | POA: Diagnosis not present

## 2017-07-07 DIAGNOSIS — G4733 Obstructive sleep apnea (adult) (pediatric): Secondary | ICD-10-CM | POA: Diagnosis not present

## 2017-07-09 DIAGNOSIS — H6691 Otitis media, unspecified, right ear: Secondary | ICD-10-CM | POA: Insufficient documentation

## 2017-07-09 DIAGNOSIS — M26609 Unspecified temporomandibular joint disorder, unspecified side: Secondary | ICD-10-CM | POA: Insufficient documentation

## 2017-07-09 DIAGNOSIS — H65191 Other acute nonsuppurative otitis media, right ear: Secondary | ICD-10-CM | POA: Diagnosis not present

## 2017-07-16 DIAGNOSIS — J3089 Other allergic rhinitis: Secondary | ICD-10-CM | POA: Diagnosis not present

## 2017-07-16 DIAGNOSIS — J301 Allergic rhinitis due to pollen: Secondary | ICD-10-CM | POA: Diagnosis not present

## 2017-07-21 DIAGNOSIS — J3089 Other allergic rhinitis: Secondary | ICD-10-CM | POA: Diagnosis not present

## 2017-07-21 DIAGNOSIS — J301 Allergic rhinitis due to pollen: Secondary | ICD-10-CM | POA: Diagnosis not present

## 2017-07-30 ENCOUNTER — Ambulatory Visit (INDEPENDENT_AMBULATORY_CARE_PROVIDER_SITE_OTHER): Payer: Federal, State, Local not specified - PPO | Admitting: Podiatry

## 2017-07-30 ENCOUNTER — Encounter: Payer: Self-pay | Admitting: Podiatry

## 2017-07-30 VITALS — BP 130/74 | HR 79 | Resp 16

## 2017-07-30 DIAGNOSIS — J301 Allergic rhinitis due to pollen: Secondary | ICD-10-CM | POA: Diagnosis not present

## 2017-07-30 DIAGNOSIS — B070 Plantar wart: Secondary | ICD-10-CM | POA: Diagnosis not present

## 2017-07-30 DIAGNOSIS — M779 Enthesopathy, unspecified: Secondary | ICD-10-CM

## 2017-07-30 DIAGNOSIS — M778 Other enthesopathies, not elsewhere classified: Secondary | ICD-10-CM

## 2017-07-30 DIAGNOSIS — J3089 Other allergic rhinitis: Secondary | ICD-10-CM | POA: Diagnosis not present

## 2017-08-01 NOTE — Progress Notes (Signed)
Subjective:   Patient ID: Ryan Jones, male   DOB: 62 y.o.   MRN: [id number]   HPI Patient presents stating he is having a lot of pain on the bottom of his left foot and he thinks maybe he stepped on something or something may have occurred because it is been sore and making it hard to walk.  Does not remember specific injury and sugar is not been very control and his last A1c was 9.3 and he is obese.  Patient does not smoke and likes to be active   Review of Systems  All other systems reviewed and are negative.       Objective:  Physical Exam  Constitutional: He appears well-developed and well-nourished.  Cardiovascular: Intact distal pulses.  Pulmonary/Chest: Effort normal.  Musculoskeletal: Normal range of motion.  Neurological: He is alert.  Skin: Skin is warm.  Nursing note and vitals reviewed.   Neurovascular status found to be in reasonably good control with diminishment of sharp dull and vibratory the present with patient noted to have lesion sub-second metatarsal left foot that upon debridement shows pinpoint bleeding pain to lateral pressure and it is localized to this area.  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  At this time I did sterile debridement of the area noted the pinpoint bleeding and then applied some immune agent to this to try to create immune response and applied sterile dressing.  Instructed what to do if any kinds of blistering were to occur patient will be seen back in a month if needed

## 2017-08-05 DIAGNOSIS — J301 Allergic rhinitis due to pollen: Secondary | ICD-10-CM | POA: Diagnosis not present

## 2017-08-05 DIAGNOSIS — J3089 Other allergic rhinitis: Secondary | ICD-10-CM | POA: Diagnosis not present

## 2017-08-13 DIAGNOSIS — J301 Allergic rhinitis due to pollen: Secondary | ICD-10-CM | POA: Diagnosis not present

## 2017-08-13 DIAGNOSIS — J3089 Other allergic rhinitis: Secondary | ICD-10-CM | POA: Diagnosis not present

## 2017-08-20 DIAGNOSIS — J3089 Other allergic rhinitis: Secondary | ICD-10-CM | POA: Diagnosis not present

## 2017-08-20 DIAGNOSIS — J301 Allergic rhinitis due to pollen: Secondary | ICD-10-CM | POA: Diagnosis not present

## 2017-08-27 DIAGNOSIS — J3089 Other allergic rhinitis: Secondary | ICD-10-CM | POA: Diagnosis not present

## 2017-08-27 DIAGNOSIS — J301 Allergic rhinitis due to pollen: Secondary | ICD-10-CM | POA: Diagnosis not present

## 2017-09-03 DIAGNOSIS — J301 Allergic rhinitis due to pollen: Secondary | ICD-10-CM | POA: Diagnosis not present

## 2017-09-03 DIAGNOSIS — J3089 Other allergic rhinitis: Secondary | ICD-10-CM | POA: Diagnosis not present

## 2017-09-06 DIAGNOSIS — E6609 Other obesity due to excess calories: Secondary | ICD-10-CM | POA: Diagnosis not present

## 2017-09-06 DIAGNOSIS — K760 Fatty (change of) liver, not elsewhere classified: Secondary | ICD-10-CM | POA: Diagnosis not present

## 2017-09-10 DIAGNOSIS — J301 Allergic rhinitis due to pollen: Secondary | ICD-10-CM | POA: Diagnosis not present

## 2017-09-10 DIAGNOSIS — J3089 Other allergic rhinitis: Secondary | ICD-10-CM | POA: Diagnosis not present

## 2017-09-17 DIAGNOSIS — J301 Allergic rhinitis due to pollen: Secondary | ICD-10-CM | POA: Diagnosis not present

## 2017-09-17 DIAGNOSIS — J3089 Other allergic rhinitis: Secondary | ICD-10-CM | POA: Diagnosis not present

## 2017-09-23 ENCOUNTER — Other Ambulatory Visit: Payer: Self-pay | Admitting: Family Medicine

## 2017-10-01 DIAGNOSIS — J301 Allergic rhinitis due to pollen: Secondary | ICD-10-CM | POA: Diagnosis not present

## 2017-10-01 DIAGNOSIS — J3089 Other allergic rhinitis: Secondary | ICD-10-CM | POA: Diagnosis not present

## 2017-10-07 DIAGNOSIS — J301 Allergic rhinitis due to pollen: Secondary | ICD-10-CM | POA: Diagnosis not present

## 2017-10-07 DIAGNOSIS — J3089 Other allergic rhinitis: Secondary | ICD-10-CM | POA: Diagnosis not present

## 2017-10-12 DIAGNOSIS — J301 Allergic rhinitis due to pollen: Secondary | ICD-10-CM | POA: Diagnosis not present

## 2017-10-12 DIAGNOSIS — J3089 Other allergic rhinitis: Secondary | ICD-10-CM | POA: Diagnosis not present

## 2017-10-12 DIAGNOSIS — K219 Gastro-esophageal reflux disease without esophagitis: Secondary | ICD-10-CM | POA: Diagnosis not present

## 2017-10-12 DIAGNOSIS — J4520 Mild intermittent asthma, uncomplicated: Secondary | ICD-10-CM | POA: Diagnosis not present

## 2017-10-15 DIAGNOSIS — J301 Allergic rhinitis due to pollen: Secondary | ICD-10-CM | POA: Diagnosis not present

## 2017-10-15 DIAGNOSIS — J3089 Other allergic rhinitis: Secondary | ICD-10-CM | POA: Diagnosis not present

## 2017-10-19 DIAGNOSIS — J301 Allergic rhinitis due to pollen: Secondary | ICD-10-CM | POA: Diagnosis not present

## 2017-10-19 DIAGNOSIS — J3089 Other allergic rhinitis: Secondary | ICD-10-CM | POA: Diagnosis not present

## 2017-10-29 DIAGNOSIS — J3089 Other allergic rhinitis: Secondary | ICD-10-CM | POA: Diagnosis not present

## 2017-10-29 DIAGNOSIS — J301 Allergic rhinitis due to pollen: Secondary | ICD-10-CM | POA: Diagnosis not present

## 2017-11-10 DIAGNOSIS — J301 Allergic rhinitis due to pollen: Secondary | ICD-10-CM | POA: Diagnosis not present

## 2017-11-10 DIAGNOSIS — J3089 Other allergic rhinitis: Secondary | ICD-10-CM | POA: Diagnosis not present

## 2017-11-15 DIAGNOSIS — R945 Abnormal results of liver function studies: Secondary | ICD-10-CM | POA: Diagnosis not present

## 2017-11-15 DIAGNOSIS — I10 Essential (primary) hypertension: Secondary | ICD-10-CM | POA: Diagnosis not present

## 2017-11-15 DIAGNOSIS — E7800 Pure hypercholesterolemia, unspecified: Secondary | ICD-10-CM | POA: Diagnosis not present

## 2017-11-15 DIAGNOSIS — E119 Type 2 diabetes mellitus without complications: Secondary | ICD-10-CM | POA: Diagnosis not present

## 2017-11-17 DIAGNOSIS — M109 Gout, unspecified: Secondary | ICD-10-CM | POA: Diagnosis not present

## 2017-11-17 DIAGNOSIS — J3089 Other allergic rhinitis: Secondary | ICD-10-CM | POA: Diagnosis not present

## 2017-11-17 DIAGNOSIS — J301 Allergic rhinitis due to pollen: Secondary | ICD-10-CM | POA: Diagnosis not present

## 2017-11-17 DIAGNOSIS — E119 Type 2 diabetes mellitus without complications: Secondary | ICD-10-CM | POA: Diagnosis not present

## 2017-11-17 DIAGNOSIS — I10 Essential (primary) hypertension: Secondary | ICD-10-CM | POA: Diagnosis not present

## 2017-11-17 DIAGNOSIS — Z1211 Encounter for screening for malignant neoplasm of colon: Secondary | ICD-10-CM | POA: Diagnosis not present

## 2017-11-23 DIAGNOSIS — L570 Actinic keratosis: Secondary | ICD-10-CM | POA: Diagnosis not present

## 2017-11-23 DIAGNOSIS — L989 Disorder of the skin and subcutaneous tissue, unspecified: Secondary | ICD-10-CM | POA: Diagnosis not present

## 2017-11-23 DIAGNOSIS — L814 Other melanin hyperpigmentation: Secondary | ICD-10-CM | POA: Diagnosis not present

## 2017-11-23 DIAGNOSIS — D485 Neoplasm of uncertain behavior of skin: Secondary | ICD-10-CM | POA: Diagnosis not present

## 2017-11-23 DIAGNOSIS — C44622 Squamous cell carcinoma of skin of right upper limb, including shoulder: Secondary | ICD-10-CM | POA: Diagnosis not present

## 2017-11-24 DIAGNOSIS — J301 Allergic rhinitis due to pollen: Secondary | ICD-10-CM | POA: Diagnosis not present

## 2017-11-24 DIAGNOSIS — J3089 Other allergic rhinitis: Secondary | ICD-10-CM | POA: Diagnosis not present

## 2017-11-29 ENCOUNTER — Other Ambulatory Visit: Payer: Self-pay | Admitting: Gastroenterology

## 2017-11-29 DIAGNOSIS — K760 Fatty (change of) liver, not elsewhere classified: Secondary | ICD-10-CM | POA: Diagnosis not present

## 2017-11-29 DIAGNOSIS — Z1211 Encounter for screening for malignant neoplasm of colon: Secondary | ICD-10-CM | POA: Diagnosis not present

## 2017-11-29 DIAGNOSIS — E119 Type 2 diabetes mellitus without complications: Secondary | ICD-10-CM | POA: Diagnosis not present

## 2017-11-29 DIAGNOSIS — E6609 Other obesity due to excess calories: Secondary | ICD-10-CM | POA: Diagnosis not present

## 2017-11-30 DIAGNOSIS — J3089 Other allergic rhinitis: Secondary | ICD-10-CM | POA: Diagnosis not present

## 2017-11-30 DIAGNOSIS — J301 Allergic rhinitis due to pollen: Secondary | ICD-10-CM | POA: Diagnosis not present

## 2017-12-07 DIAGNOSIS — J301 Allergic rhinitis due to pollen: Secondary | ICD-10-CM | POA: Diagnosis not present

## 2017-12-07 DIAGNOSIS — J3089 Other allergic rhinitis: Secondary | ICD-10-CM | POA: Diagnosis not present

## 2017-12-09 DIAGNOSIS — B078 Other viral warts: Secondary | ICD-10-CM | POA: Diagnosis not present

## 2017-12-09 DIAGNOSIS — C44622 Squamous cell carcinoma of skin of right upper limb, including shoulder: Secondary | ICD-10-CM | POA: Diagnosis not present

## 2017-12-17 DIAGNOSIS — J301 Allergic rhinitis due to pollen: Secondary | ICD-10-CM | POA: Diagnosis not present

## 2017-12-17 DIAGNOSIS — J3089 Other allergic rhinitis: Secondary | ICD-10-CM | POA: Diagnosis not present

## 2017-12-30 DIAGNOSIS — J3089 Other allergic rhinitis: Secondary | ICD-10-CM | POA: Diagnosis not present

## 2017-12-30 DIAGNOSIS — J301 Allergic rhinitis due to pollen: Secondary | ICD-10-CM | POA: Diagnosis not present

## 2018-01-07 DIAGNOSIS — J3089 Other allergic rhinitis: Secondary | ICD-10-CM | POA: Diagnosis not present

## 2018-01-07 DIAGNOSIS — J301 Allergic rhinitis due to pollen: Secondary | ICD-10-CM | POA: Diagnosis not present

## 2018-01-18 ENCOUNTER — Encounter (HOSPITAL_COMMUNITY): Payer: Self-pay | Admitting: *Deleted

## 2018-01-18 ENCOUNTER — Other Ambulatory Visit: Payer: Self-pay

## 2018-01-20 DIAGNOSIS — J3089 Other allergic rhinitis: Secondary | ICD-10-CM | POA: Diagnosis not present

## 2018-01-20 DIAGNOSIS — J301 Allergic rhinitis due to pollen: Secondary | ICD-10-CM | POA: Diagnosis not present

## 2018-01-31 ENCOUNTER — Encounter (HOSPITAL_COMMUNITY): Payer: Self-pay | Admitting: *Deleted

## 2018-01-31 ENCOUNTER — Other Ambulatory Visit: Payer: Self-pay

## 2018-02-04 ENCOUNTER — Ambulatory Visit (HOSPITAL_COMMUNITY)
Admission: RE | Admit: 2018-02-04 | Discharge: 2018-02-04 | Disposition: A | Discharge status: Home / Self Care | Payer: Federal, State, Local not specified - PPO | Source: Ambulatory Visit | Attending: Gastroenterology | Admitting: Gastroenterology

## 2018-02-04 ENCOUNTER — Other Ambulatory Visit: Payer: Self-pay

## 2018-02-04 ENCOUNTER — Encounter (HOSPITAL_COMMUNITY)
Admission: RE | Disposition: A | Discharge status: Home / Self Care | Payer: Self-pay | Source: Ambulatory Visit | Attending: Gastroenterology

## 2018-02-04 ENCOUNTER — Ambulatory Visit (HOSPITAL_COMMUNITY): Payer: Federal, State, Local not specified - PPO | Admitting: Certified Registered Nurse Anesthetist

## 2018-02-04 ENCOUNTER — Encounter (HOSPITAL_COMMUNITY): Payer: Self-pay | Admitting: Emergency Medicine

## 2018-02-04 DIAGNOSIS — D122 Benign neoplasm of ascending colon: Secondary | ICD-10-CM | POA: Diagnosis not present

## 2018-02-04 DIAGNOSIS — I10 Essential (primary) hypertension: Secondary | ICD-10-CM | POA: Diagnosis not present

## 2018-02-04 DIAGNOSIS — K219 Gastro-esophageal reflux disease without esophagitis: Secondary | ICD-10-CM | POA: Insufficient documentation

## 2018-02-04 DIAGNOSIS — E119 Type 2 diabetes mellitus without complications: Secondary | ICD-10-CM | POA: Insufficient documentation

## 2018-02-04 DIAGNOSIS — K5520 Angiodysplasia of colon without hemorrhage: Secondary | ICD-10-CM | POA: Diagnosis not present

## 2018-02-04 DIAGNOSIS — R51 Headache: Secondary | ICD-10-CM | POA: Diagnosis not present

## 2018-02-04 DIAGNOSIS — R197 Diarrhea, unspecified: Secondary | ICD-10-CM | POA: Insufficient documentation

## 2018-02-04 DIAGNOSIS — Z85828 Personal history of other malignant neoplasm of skin: Secondary | ICD-10-CM | POA: Insufficient documentation

## 2018-02-04 DIAGNOSIS — K635 Polyp of colon: Secondary | ICD-10-CM | POA: Diagnosis not present

## 2018-02-04 DIAGNOSIS — M109 Gout, unspecified: Secondary | ICD-10-CM | POA: Insufficient documentation

## 2018-02-04 DIAGNOSIS — M1990 Unspecified osteoarthritis, unspecified site: Secondary | ICD-10-CM | POA: Diagnosis not present

## 2018-02-04 DIAGNOSIS — D175 Benign lipomatous neoplasm of intra-abdominal organs: Secondary | ICD-10-CM | POA: Diagnosis not present

## 2018-02-04 DIAGNOSIS — Z885 Allergy status to narcotic agent status: Secondary | ICD-10-CM | POA: Diagnosis not present

## 2018-02-04 DIAGNOSIS — Z1211 Encounter for screening for malignant neoplasm of colon: Secondary | ICD-10-CM | POA: Insufficient documentation

## 2018-02-04 DIAGNOSIS — I4892 Unspecified atrial flutter: Secondary | ICD-10-CM | POA: Diagnosis not present

## 2018-02-04 DIAGNOSIS — K5900 Constipation, unspecified: Secondary | ICD-10-CM | POA: Diagnosis not present

## 2018-02-04 DIAGNOSIS — Z6841 Body Mass Index (BMI) 40.0 and over, adult: Secondary | ICD-10-CM | POA: Insufficient documentation

## 2018-02-04 DIAGNOSIS — D1779 Benign lipomatous neoplasm of other sites: Secondary | ICD-10-CM | POA: Insufficient documentation

## 2018-02-04 DIAGNOSIS — D123 Benign neoplasm of transverse colon: Secondary | ICD-10-CM | POA: Diagnosis not present

## 2018-02-04 DIAGNOSIS — Z880 Allergy status to penicillin: Secondary | ICD-10-CM | POA: Diagnosis not present

## 2018-02-04 DIAGNOSIS — Z87442 Personal history of urinary calculi: Secondary | ICD-10-CM | POA: Insufficient documentation

## 2018-02-04 DIAGNOSIS — G4730 Sleep apnea, unspecified: Secondary | ICD-10-CM | POA: Insufficient documentation

## 2018-02-04 HISTORY — DX: Headache: R51

## 2018-02-04 HISTORY — DX: Headache, unspecified: R51.9

## 2018-02-04 HISTORY — PX: POLYPECTOMY: SHX5525

## 2018-02-04 HISTORY — PX: COLONOSCOPY WITH PROPOFOL: SHX5780

## 2018-02-04 LAB — GLUCOSE, CAPILLARY: Glucose-Capillary: 197 mg/dL — ABNORMAL HIGH (ref 70–99)

## 2018-02-04 SURGERY — COLONOSCOPY WITH PROPOFOL
Anesthesia: Monitor Anesthesia Care

## 2018-02-04 MED ORDER — LACTATED RINGERS IV SOLN
INTRAVENOUS | Status: DC | PRN
  Administered 2018-02-04: 10:00:00 via INTRAVENOUS

## 2018-02-04 MED ORDER — PROPOFOL 10 MG/ML IV BOLUS
INTRAVENOUS | Status: DC | PRN
  Administered 2018-02-04: 30 mg via INTRAVENOUS
  Administered 2018-02-04: 40 mg via INTRAVENOUS

## 2018-02-04 MED ORDER — PROPOFOL 500 MG/50ML IV EMUL
INTRAVENOUS | Status: DC | PRN
  Administered 2018-02-04: 125 ug/kg/min via INTRAVENOUS

## 2018-02-04 MED ORDER — LACTATED RINGERS IV SOLN
INTRAVENOUS | Status: DC
  Administered 2018-02-04: 10:00:00 via INTRAVENOUS

## 2018-02-04 MED ORDER — PROPOFOL 10 MG/ML IV BOLUS
INTRAVENOUS | Status: AC
  Filled 2018-02-04: qty 40

## 2018-02-04 MED ORDER — SODIUM CHLORIDE 0.9 % IV SOLN: INTRAVENOUS | Status: DC

## 2018-02-04 MED ORDER — PROPOFOL 10 MG/ML IV BOLUS
INTRAVENOUS | Status: AC
  Filled 2018-02-04: qty 20

## 2018-02-04 SURGICAL SUPPLY — 21 items

## 2018-02-04 NOTE — Anesthesia Preprocedure Evaluation (Signed)
Anesthesia Evaluation  Patient identified by MRN, date of birth, ID band Patient awake    Reviewed: Allergy & Precautions, NPO status , Patient's Chart, lab work & pertinent test results  Airway Mallampati: II  TM Distance: >3 FB Neck ROM: Full    Dental no notable dental hx.    Pulmonary sleep apnea ,    Pulmonary exam normal breath sounds clear to auscultation       Cardiovascular hypertension, Pt. on medications Normal cardiovascular exam+ dysrhythmias Atrial Fibrillation  Rhythm:Regular Rate:Normal     Neuro/Psych negative neurological ROS  negative psych ROS   GI/Hepatic Neg liver ROS, GERD  ,  Endo/Other  diabetes, Type 2Morbid obesity  Renal/GU negative Renal ROS  negative genitourinary   Musculoskeletal negative musculoskeletal ROS (+)   Abdominal   Peds negative pediatric ROS (+)  Hematology negative hematology ROS (+)   Anesthesia Other Findings   Reproductive/Obstetrics negative OB ROS                             Anesthesia Physical Anesthesia Plan  ASA: III  Anesthesia Plan: MAC   Post-op Pain Management:    Induction:   PONV Risk Score and Plan: 1 and Treatment may vary due to age or medical condition  Airway Management Planned: Nasal Cannula and Simple Face Mask  Additional Equipment:   Intra-op Plan:   Post-operative Plan:   Informed Consent: I have reviewed the patients History and Physical, chart, labs and discussed the procedure including the risks, benefits and alternatives for the proposed anesthesia with the patient or authorized representative who has indicated his/her understanding and acceptance.   Dental advisory given  Plan Discussed with:   Anesthesia Plan Comments:         Anesthesia Quick Evaluation

## 2018-02-04 NOTE — Op Note (Signed)
[facility] Patient Name: Ryan Jones Procedure Date: 02/04/2018 MRN: [id number] Attending MD: Carol Ada , MD Date of Birth: 1955-09-08 CSN: [id number] Age: 62 Admit Type: Outpatient Procedure:                Colonoscopy Indications:              Screening for colorectal malignant neoplasm Providers:                Carol Ada, MD, Elmer Ramp. Tilden Dome, RN, Elspeth Cho Tech., Technician, Dellie Catholic, CRNA Referring MD:              Medicines:                Propofol per Anesthesia Complications:            No immediate complications. Estimated Blood Loss:     Estimated blood loss was minimal. Procedure:                Pre-Anesthesia Assessment:                           - Prior to the procedure, a History and Physical                            was performed, and patient medications and                            allergies were reviewed. The patient's tolerance of                            previous anesthesia was also reviewed. The risks                            and benefits of the procedure and the sedation                            options and risks were discussed with the patient.                            All questions were answered, and informed consent                            was obtained. Prior Anticoagulants: The patient has                            taken no previous anticoagulant or antiplatelet                            agents. ASA Grade Assessment: III - A patient with                            severe systemic disease. After reviewing the risks  and benefits, the patient was deemed in                            satisfactory condition to undergo the procedure.                           - Sedation was administered by an anesthesia                            professional. Deep sedation was attained.                           After obtaining informed consent, the colonoscope            was passed under direct vision. Throughout the                            procedure, the patient's blood pressure, pulse, and                            oxygen saturations were monitored continuously. The                            CF-HQ190L ([id number]) Olympus adult colonoscope was                            introduced through the anus and advanced to the the                            cecum, identified by appendiceal orifice and                            ileocecal valve. The colonoscopy was performed                            without difficulty. The patient tolerated the                            procedure well. The quality of the bowel                            preparation was excellent. The ileocecal valve,                            appendiceal orifice, and rectum were photographed. Scope In: 10:42:04 AM Scope Out: 11:09:22 AM Scope Withdrawal Time: 0 hours 15 minutes 20 seconds  Total Procedure Duration: 0 hours 27 minutes 18 seconds  Findings:      Five sessile polyps were found in the transverse colon and ascending       colon. The polyps were 5 to 6 mm in size. These polyps were removed with       a cold snare. Resection and retrieval were complete.      There was a medium-sized lipoma, in the ascending colon.      Three localized angiodysplastic lesions without bleeding were found in  the ascending colon and in the cecum. Impression:               - Five 5 to 6 mm polyps in the transverse colon and                            in the ascending colon, removed with a cold snare.                            Resected and retrieved.                           - Medium-sized lipoma in the ascending colon.                           - Three non-bleeding colonic angiodysplastic                            lesions. Moderate Sedation:      N/A- Per Anesthesia Care Recommendation:           - Patient has a contact number available for                            emergencies.  The signs and symptoms of potential                            delayed complications were discussed with the                            patient. Return to normal activities tomorrow.                            Written discharge instructions were provided to the                            patient.                           - Resume previous diet.                           - Continue present medications.                           - Await pathology results.                           - Repeat colonoscopy in 3 years for surveillance. Procedure Code(s):        --- Professional ---                           [phone], Colonoscopy, flexible; with removal of                            tumor(s), polyp(s), or other lesion(s) by snare  technique Diagnosis Code(s):        --- Professional ---                           Z12.11, Encounter for screening for malignant                            neoplasm of colon                           D12.3, Benign neoplasm of transverse colon (hepatic                            flexure or splenic flexure)                           D12.2, Benign neoplasm of ascending colon                           D17.5, Benign lipomatous neoplasm of                            intra-abdominal organs                           K55.20, Angiodysplasia of colon without hemorrhage CPT copyright 2018 American Medical Association. All rights reserved. The codes documented in this report are preliminary and upon coder review may  be revised to meet current compliance requirements. Carol Ada, MD Carol Ada, MD 02/04/2018 11:15:44 AM This report has been signed electronically. Number of Addenda: 0

## 2018-02-04 NOTE — H&P (Signed)
Ryan Jones HPI: The plan was for him to loss weight to decrease his BMI to below 50 in order to have his colonoscopy in an [place] setting. His lastyest blood work is as follows: AST 45, ALT 57, and AP 64. Even though he has not lost weight, he does feel much better with daily walks. He averages 1 mile per day and his A1C is at 6.8%. Previously it was at 9.3%. He has managed to drop his A1C, but cutting out bread and other sweet foods.   Past Medical History:  Diagnosis Date  . Arthritis   . Atrial flutter ([place]) 02/26/11   s/p RFCA 02/2011  . Bronchitis    h/o recurrent bronchitis; "usually get it 1-2X/wy; last time 12/2010"  . 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 Constipation   . Diabetes mellitus    "pills"  . Diarrhea   . GERD (gastroesophageal reflux disease)   . Gout    "get it in my hands and feet"  . Gout   . Headache    sinus headache  . Hemorrhoids   . Hypertension   . Kidney stone    1999  . Seasonal allergies 02/26/11    "I take an allergy shot q week"  . Sleep apnea    no cpap used much since nasal surgery yrs ago    Past Surgical History:  Procedure Laterality Date  . ATRIAL FLUTTER ABLATION N/A 02/26/2011   Procedure: ATRIAL FLUTTER ABLATION;  Surgeon: Deboraha Sprang, MD;  Location: [facility] CATH LAB;  Service: Cardiovascular;  Laterality: N/A;  . BACK SURGERY  before 1995 and in 1995;    "ruptured disc repaired; lower back"  . CARDIAC ELECTROPHYSIOLOGY STUDY AND ABLATION  02/26/2011   for atrial fib  . COLONOSCOPY WITH PROPOFOL N/A 01/18/2015   Procedure: COLONOSCOPY WITH PROPOFOL;  Surgeon: Carol Ada, MD;  Location: [facility] ENDOSCOPY;  Service: Endoscopy;  Laterality: N/A;  . LUMBAR [place]  ~ 1993   "put foam in back; in place of disc"  . LUMBAR DISC SURGERY  04/1993   "replaced gel foam that had blown out"  . nasal septum surgery to open up sinuses  yrs ago   dr Simeon Craft  . SKIN CANCER EXCISION  2011   right ear  . squamous cell area removed from  right arm  6 weeks ago    History reviewed. No pertinent family history.  Social History:  reports that he has never smoked. His smokeless tobacco use includes snuff. He reports that he does not drink alcohol or use drugs.  Allergies:  Allergies  Allergen Reactions  . Amoxicillin-Pot Clavulanate Other (See Comments)    Thrush and yeast infection Has patient had a PCN reaction causing immediate rash, facial/tongue/throat swelling, SOB or lightheadedness with hypotension: No Has patient had a PCN reaction causing severe rash involving mucus membranes or skin necrosis: No Has patient had a PCN reaction that required hospitalization: No Has patient had a PCN reaction occurring within the last 10 years: No If all of the above answers are "NO", then may proceed with Cephalosporin use.    . Codeine Itching    Medications:  Scheduled:  Continuous: . sodium chloride    . lactated ringers      No results found for this or any previous visit (from the past 24 hour(s)).   No results found.  ROS:  As stated above in the HPI otherwise negative.  Blood pressure (!) 146/87, pulse 88,  temperature 97.7 F (36.5 C), temperature source Oral, resp. rate 15, height 5\' 9"  (1.753 m), weight (!) 155.1 kg, SpO2 97 %.    PE: Gen: NAD, Alert and Oriented HEENT:  [place]/AT, EOMI Neck: Supple, no LAD Lungs: CTA Bilaterally CV: RRR without M/G/R ABM: Soft, NTND, +BS Ext: No C/C/E  Assessment/Plan: 1) Screening colonoscopy.  Rosaelena Kemnitz D 02/04/2018, 10:22 AM

## 2018-02-04 NOTE — Anesthesia Postprocedure Evaluation (Signed)
Anesthesia Post Note  Patient: Ryan Jones  Procedure(s) Performed: COLONOSCOPY WITH PROPOFOL (N/A ) POLYPECTOMY     Patient location during evaluation: Endoscopy Anesthesia Type: MAC Level of consciousness: awake and alert Pain management: pain level controlled Vital Signs Assessment: post-procedure vital signs reviewed and stable Respiratory status: spontaneous breathing, nonlabored ventilation, respiratory function stable and patient connected to nasal cannula oxygen Cardiovascular status: stable and blood pressure returned to baseline Postop Assessment: no apparent nausea or vomiting Anesthetic complications: no    Last Vitals:  Vitals:   02/04/18 1120 02/04/18 1130  BP: 124/67 (!) 145/95  Pulse: 71 73  Resp: 18 14  Temp:    SpO2: 100% 98%    Last Pain:  Vitals:   02/04/18 1130  TempSrc:   PainSc: 0-No pain                 Montez Hageman

## 2018-02-04 NOTE — Transfer of Care (Signed)
Immediate Anesthesia Transfer of Care Note  Patient: Ryan Jones  Procedure(s) Performed: COLONOSCOPY WITH PROPOFOL (N/A ) POLYPECTOMY  Patient Location: Endoscopy Unit  Anesthesia Type:MAC  Level of Consciousness: awake, alert , oriented and patient cooperative  Airway & Oxygen Therapy: Patient Spontanous Breathing and Patient connected to face mask  Post-op Assessment: Report given to RN and Post -op Vital signs reviewed and stable  Post vital signs: Reviewed and stable  Last Vitals:  Vitals Value Taken Time  BP    Temp    Pulse 75 02/04/2018 11:15 AM  Resp 17 02/04/2018 11:15 AM  SpO2 100 % 02/04/2018 11:15 AM  Vitals shown include unvalidated device data.  Last Pain:  Vitals:   02/04/18 1015  TempSrc: Oral  PainSc: 0-No pain         Complications: No apparent anesthesia complications

## 2018-02-04 NOTE — Discharge Instructions (Signed)

## 2018-02-07 ENCOUNTER — Encounter (HOSPITAL_COMMUNITY): Payer: Self-pay | Admitting: Gastroenterology

## 2018-02-11 DIAGNOSIS — J301 Allergic rhinitis due to pollen: Secondary | ICD-10-CM | POA: Diagnosis not present

## 2018-02-11 DIAGNOSIS — J3089 Other allergic rhinitis: Secondary | ICD-10-CM | POA: Diagnosis not present

## 2018-02-15 ENCOUNTER — Other Ambulatory Visit: Payer: Self-pay

## 2018-02-15 MED ORDER — HYDROCHLOROTHIAZIDE 25 MG PO TABS: 25.0000 mg | ORAL_TABLET | Freq: Every day | ORAL | 0 refills | Status: DC

## 2018-02-25 DIAGNOSIS — J3089 Other allergic rhinitis: Secondary | ICD-10-CM | POA: Diagnosis not present

## 2018-02-25 DIAGNOSIS — J301 Allergic rhinitis due to pollen: Secondary | ICD-10-CM | POA: Diagnosis not present

## 2018-03-11 DIAGNOSIS — J301 Allergic rhinitis due to pollen: Secondary | ICD-10-CM | POA: Diagnosis not present

## 2018-03-11 DIAGNOSIS — J3089 Other allergic rhinitis: Secondary | ICD-10-CM | POA: Diagnosis not present

## 2018-03-25 DIAGNOSIS — J3089 Other allergic rhinitis: Secondary | ICD-10-CM | POA: Diagnosis not present

## 2018-03-25 DIAGNOSIS — J301 Allergic rhinitis due to pollen: Secondary | ICD-10-CM | POA: Diagnosis not present

## 2018-04-08 DIAGNOSIS — J301 Allergic rhinitis due to pollen: Secondary | ICD-10-CM | POA: Diagnosis not present

## 2018-04-08 DIAGNOSIS — J3089 Other allergic rhinitis: Secondary | ICD-10-CM | POA: Diagnosis not present

## 2018-04-26 DIAGNOSIS — J301 Allergic rhinitis due to pollen: Secondary | ICD-10-CM | POA: Diagnosis not present

## 2018-04-26 DIAGNOSIS — J3089 Other allergic rhinitis: Secondary | ICD-10-CM | POA: Diagnosis not present

## 2018-05-13 DIAGNOSIS — R945 Abnormal results of liver function studies: Secondary | ICD-10-CM | POA: Diagnosis not present

## 2018-05-13 DIAGNOSIS — D691 Qualitative platelet defects: Secondary | ICD-10-CM | POA: Diagnosis not present

## 2018-05-13 DIAGNOSIS — E7800 Pure hypercholesterolemia, unspecified: Secondary | ICD-10-CM | POA: Diagnosis not present

## 2018-05-13 DIAGNOSIS — I10 Essential (primary) hypertension: Secondary | ICD-10-CM | POA: Diagnosis not present

## 2018-05-13 DIAGNOSIS — J301 Allergic rhinitis due to pollen: Secondary | ICD-10-CM | POA: Diagnosis not present

## 2018-05-13 DIAGNOSIS — J3089 Other allergic rhinitis: Secondary | ICD-10-CM | POA: Diagnosis not present

## 2018-05-13 DIAGNOSIS — E119 Type 2 diabetes mellitus without complications: Secondary | ICD-10-CM | POA: Diagnosis not present

## 2018-05-16 DIAGNOSIS — J101 Influenza due to other identified influenza virus with other respiratory manifestations: Secondary | ICD-10-CM | POA: Diagnosis not present

## 2018-05-16 DIAGNOSIS — E7800 Pure hypercholesterolemia, unspecified: Secondary | ICD-10-CM | POA: Diagnosis not present

## 2018-05-16 DIAGNOSIS — M109 Gout, unspecified: Secondary | ICD-10-CM | POA: Diagnosis not present

## 2018-05-16 DIAGNOSIS — E119 Type 2 diabetes mellitus without complications: Secondary | ICD-10-CM | POA: Diagnosis not present

## 2018-05-30 DIAGNOSIS — J301 Allergic rhinitis due to pollen: Secondary | ICD-10-CM | POA: Diagnosis not present

## 2018-05-30 DIAGNOSIS — J3089 Other allergic rhinitis: Secondary | ICD-10-CM | POA: Diagnosis not present

## 2018-06-14 DIAGNOSIS — J301 Allergic rhinitis due to pollen: Secondary | ICD-10-CM | POA: Diagnosis not present

## 2018-06-14 DIAGNOSIS — J3089 Other allergic rhinitis: Secondary | ICD-10-CM | POA: Diagnosis not present

## 2018-06-20 DIAGNOSIS — J3089 Other allergic rhinitis: Secondary | ICD-10-CM | POA: Diagnosis not present

## 2018-06-20 DIAGNOSIS — J301 Allergic rhinitis due to pollen: Secondary | ICD-10-CM | POA: Diagnosis not present

## 2018-06-27 DIAGNOSIS — J301 Allergic rhinitis due to pollen: Secondary | ICD-10-CM | POA: Diagnosis not present

## 2018-06-27 DIAGNOSIS — J3089 Other allergic rhinitis: Secondary | ICD-10-CM | POA: Diagnosis not present

## 2018-07-05 DIAGNOSIS — J3089 Other allergic rhinitis: Secondary | ICD-10-CM | POA: Diagnosis not present

## 2018-07-05 DIAGNOSIS — J301 Allergic rhinitis due to pollen: Secondary | ICD-10-CM | POA: Diagnosis not present

## 2018-07-14 DIAGNOSIS — J3089 Other allergic rhinitis: Secondary | ICD-10-CM | POA: Diagnosis not present

## 2018-07-14 DIAGNOSIS — J301 Allergic rhinitis due to pollen: Secondary | ICD-10-CM | POA: Diagnosis not present

## 2018-07-27 DIAGNOSIS — J301 Allergic rhinitis due to pollen: Secondary | ICD-10-CM | POA: Diagnosis not present

## 2018-07-27 DIAGNOSIS — J3089 Other allergic rhinitis: Secondary | ICD-10-CM | POA: Diagnosis not present

## 2018-08-12 DIAGNOSIS — J3089 Other allergic rhinitis: Secondary | ICD-10-CM | POA: Diagnosis not present

## 2018-08-12 DIAGNOSIS — J301 Allergic rhinitis due to pollen: Secondary | ICD-10-CM | POA: Diagnosis not present

## 2018-08-29 DIAGNOSIS — J301 Allergic rhinitis due to pollen: Secondary | ICD-10-CM | POA: Diagnosis not present

## 2018-08-29 DIAGNOSIS — J3089 Other allergic rhinitis: Secondary | ICD-10-CM | POA: Diagnosis not present

## 2018-09-28 ENCOUNTER — Other Ambulatory Visit: Payer: Self-pay | Admitting: Dermatology

## 2018-09-28 DIAGNOSIS — L814 Other melanin hyperpigmentation: Secondary | ICD-10-CM | POA: Diagnosis not present

## 2018-09-28 DIAGNOSIS — L570 Actinic keratosis: Secondary | ICD-10-CM | POA: Diagnosis not present

## 2018-09-28 DIAGNOSIS — D229 Melanocytic nevi, unspecified: Secondary | ICD-10-CM | POA: Diagnosis not present

## 2018-09-28 DIAGNOSIS — L821 Other seborrheic keratosis: Secondary | ICD-10-CM | POA: Diagnosis not present

## 2018-09-28 DIAGNOSIS — D485 Neoplasm of uncertain behavior of skin: Secondary | ICD-10-CM | POA: Diagnosis not present

## 2018-10-11 DIAGNOSIS — G4733 Obstructive sleep apnea (adult) (pediatric): Secondary | ICD-10-CM | POA: Diagnosis not present

## 2018-11-18 DIAGNOSIS — R05 Cough: Secondary | ICD-10-CM | POA: Diagnosis not present

## 2018-11-18 DIAGNOSIS — Z20828 Contact with and (suspected) exposure to other viral communicable diseases: Secondary | ICD-10-CM | POA: Diagnosis not present

## 2018-11-18 DIAGNOSIS — J0100 Acute maxillary sinusitis, unspecified: Secondary | ICD-10-CM | POA: Diagnosis not present

## 2018-11-30 DIAGNOSIS — J0141 Acute recurrent pansinusitis: Secondary | ICD-10-CM | POA: Diagnosis not present

## 2018-11-30 DIAGNOSIS — Z20828 Contact with and (suspected) exposure to other viral communicable diseases: Secondary | ICD-10-CM | POA: Diagnosis not present

## 2018-11-30 DIAGNOSIS — R5383 Other fatigue: Secondary | ICD-10-CM | POA: Diagnosis not present

## 2018-11-30 DIAGNOSIS — R6883 Chills (without fever): Secondary | ICD-10-CM | POA: Diagnosis not present

## 2018-12-02 DIAGNOSIS — M109 Gout, unspecified: Secondary | ICD-10-CM | POA: Insufficient documentation

## 2018-12-02 DIAGNOSIS — K219 Gastro-esophageal reflux disease without esophagitis: Secondary | ICD-10-CM | POA: Insufficient documentation

## 2018-12-07 DIAGNOSIS — I10 Essential (primary) hypertension: Secondary | ICD-10-CM | POA: Diagnosis not present

## 2018-12-07 DIAGNOSIS — E119 Type 2 diabetes mellitus without complications: Secondary | ICD-10-CM | POA: Diagnosis not present

## 2018-12-07 DIAGNOSIS — K219 Gastro-esophageal reflux disease without esophagitis: Secondary | ICD-10-CM | POA: Diagnosis not present

## 2018-12-07 DIAGNOSIS — M109 Gout, unspecified: Secondary | ICD-10-CM | POA: Diagnosis not present

## 2018-12-07 DIAGNOSIS — E7800 Pure hypercholesterolemia, unspecified: Secondary | ICD-10-CM | POA: Diagnosis not present

## 2018-12-09 DIAGNOSIS — K219 Gastro-esophageal reflux disease without esophagitis: Secondary | ICD-10-CM | POA: Diagnosis not present

## 2018-12-09 DIAGNOSIS — J301 Allergic rhinitis due to pollen: Secondary | ICD-10-CM | POA: Diagnosis not present

## 2018-12-09 DIAGNOSIS — J4520 Mild intermittent asthma, uncomplicated: Secondary | ICD-10-CM | POA: Diagnosis not present

## 2018-12-09 DIAGNOSIS — J3089 Other allergic rhinitis: Secondary | ICD-10-CM | POA: Diagnosis not present

## 2018-12-19 DIAGNOSIS — J3089 Other allergic rhinitis: Secondary | ICD-10-CM | POA: Diagnosis not present

## 2018-12-19 DIAGNOSIS — J301 Allergic rhinitis due to pollen: Secondary | ICD-10-CM | POA: Diagnosis not present

## 2018-12-23 DIAGNOSIS — J3089 Other allergic rhinitis: Secondary | ICD-10-CM | POA: Diagnosis not present

## 2018-12-23 DIAGNOSIS — J301 Allergic rhinitis due to pollen: Secondary | ICD-10-CM | POA: Diagnosis not present

## 2018-12-28 DIAGNOSIS — J301 Allergic rhinitis due to pollen: Secondary | ICD-10-CM | POA: Diagnosis not present

## 2018-12-28 DIAGNOSIS — J3089 Other allergic rhinitis: Secondary | ICD-10-CM | POA: Diagnosis not present

## 2019-01-03 DIAGNOSIS — J3089 Other allergic rhinitis: Secondary | ICD-10-CM | POA: Diagnosis not present

## 2019-01-03 DIAGNOSIS — J301 Allergic rhinitis due to pollen: Secondary | ICD-10-CM | POA: Diagnosis not present

## 2019-01-10 DIAGNOSIS — J3089 Other allergic rhinitis: Secondary | ICD-10-CM | POA: Diagnosis not present

## 2019-01-10 DIAGNOSIS — J301 Allergic rhinitis due to pollen: Secondary | ICD-10-CM | POA: Diagnosis not present

## 2019-01-20 DIAGNOSIS — J301 Allergic rhinitis due to pollen: Secondary | ICD-10-CM | POA: Diagnosis not present

## 2019-01-20 DIAGNOSIS — J3089 Other allergic rhinitis: Secondary | ICD-10-CM | POA: Diagnosis not present

## 2019-01-25 DIAGNOSIS — J3089 Other allergic rhinitis: Secondary | ICD-10-CM | POA: Diagnosis not present

## 2019-01-25 DIAGNOSIS — J301 Allergic rhinitis due to pollen: Secondary | ICD-10-CM | POA: Diagnosis not present

## 2019-01-31 DIAGNOSIS — J301 Allergic rhinitis due to pollen: Secondary | ICD-10-CM | POA: Diagnosis not present

## 2019-01-31 DIAGNOSIS — J3089 Other allergic rhinitis: Secondary | ICD-10-CM | POA: Diagnosis not present

## 2019-02-08 DIAGNOSIS — J3089 Other allergic rhinitis: Secondary | ICD-10-CM | POA: Diagnosis not present

## 2019-02-08 DIAGNOSIS — J301 Allergic rhinitis due to pollen: Secondary | ICD-10-CM | POA: Diagnosis not present

## 2019-02-13 ENCOUNTER — Ambulatory Visit: Payer: Federal, State, Local not specified - PPO | Admitting: Podiatry

## 2019-02-15 DIAGNOSIS — J301 Allergic rhinitis due to pollen: Secondary | ICD-10-CM | POA: Diagnosis not present

## 2019-02-15 DIAGNOSIS — J3089 Other allergic rhinitis: Secondary | ICD-10-CM | POA: Diagnosis not present

## 2019-02-20 ENCOUNTER — Encounter: Payer: Self-pay | Admitting: Podiatry

## 2019-02-20 ENCOUNTER — Ambulatory Visit (INDEPENDENT_AMBULATORY_CARE_PROVIDER_SITE_OTHER): Payer: Federal, State, Local not specified - PPO | Admitting: Podiatry

## 2019-02-20 ENCOUNTER — Other Ambulatory Visit: Payer: Self-pay

## 2019-02-20 DIAGNOSIS — B070 Plantar wart: Secondary | ICD-10-CM

## 2019-02-20 NOTE — Progress Notes (Signed)
Subjective:   Patient ID: Ryan Jones, male   DOB: 63 y.o.   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  Applied sterile dressing advised on no activity for the next several weeks and reappoint to recheck

## 2019-02-20 NOTE — Addendum Note (Signed)
Addended by: Celene Skeen A on: 02/20/2019 04:42 PM   Modules accepted: Orders

## 2019-02-20 NOTE — Patient Instructions (Signed)
Wart Surgery-Directions for Home Care  You will need: Dial antibacterial hand soap,  gauze,  Band-aids  1. Keep the original bandage on until the following morning.  Bathe or shower with the bandage on allowing it to soak, so that when removed it won't stick to the wound. 2. After showering or bathing, remove the old bandage and cleanse the area with Dial soap and water.  Place a few drops of Dial soap and water on a piece of guaze and gently scrub the area.  Dry with a clean piece of gauze. 3. Apply antibiotic cream (polysporin, triple antibiotic or similar) to the area and place a clean square gauze bandage over and cover with a band-aid. 4. In the evening, add a few drops of Dial soap to a basin of lukewarm water and soak your foot for 15 minutes.  After soaking, follow the instructions above for cleaning the area. 5. Continue cleansing the area as described above two times a day, applying sterile gauze dressings until the doctor informs you that it is not needed. 6. The time required to heal the surgical site will depend upon the size and location of the wart.  Lesions under bony prominences heal slower.  The average healing time is 2 to 4 weeks. 7. Take over the counter Ibuprofen or Tylenol as needed should you experience any discomfort 8. If you do experience discomfort after surgery, keep the foot elevated and apply an ice pack over your ankle, 30 minutes on, 30 minutes off each hour for the rest of the day. 9. If you have any questions , please do not hesitate to contact the office.  WARTS (Verrucae)  Warts are caused by a virus that has invaded the skin.  They are more common in young adults and children and a small percentage will resolve on their own.  There are many types of warts including mosaic warts (large flat), vulgaris (domed warts-have pearl like appearance), and plantar warts (flat or cauliflower like appearance).  Warts are highly contagious and may be picked up from any  surface.  Warts thrive in a warm moist environment and are common near pools, showers, and locker room floors.  Any microscopic cut in the skin is where the virus enters and becomes a wart.  Warts are very difficult to treat and get rid of.  Patience is necessary in the treatment of this virus.  It may take months to cure and different methods may have to be used to get rid of your wart.  Standard Initial Treatment is: 1. Periodic debridement of the wart and application of Canthacur to each lesion (a blistering agent that will slough off the warty skin) 2. Dispensing of topical treatments/prescriptions to apply to the wart at home  Other options include: 1. Excision of the lesion-numbing the skin around the wart and cutting it out-requires daily soaks post-operatively and takes about 2-3 weeks to fully heal 2. Excision with CO2 Laser-Performed at the surgical center your foot is numbed up and the lesions are all cut out and then lasered with a high power laser.  Very good for multiple warts that are resistant. 3. Cimetidine (Tagamet)-Oral agent used in high does--has shown better results in children  How do I apply the standard topical treatments?  1. Salicylic Acid (Compound W wart remover liquid or gel-available at drug or grocery stores)-Apply a dime size thickness over the wart and cover with duct tape-apply at night so the medication does not spread out to the   good skin.  The skin will turn white and slowly blister off.  Use a pumice stone daily to remove the white skin as best you can.  If the skin gets too raw and painful, discontinue for a few days then resume. 2. Aldara (Imiquimod)-this is an immune response modifier.  They come in little packets so try to get at least 2 days out of each packet if you can.  Apply a small amount to the lesion and cover with duct tape.  Do not rub it in-let it absorb on its own.  Good to apply each morning.  Other Helpful Hints:  Wash shoes that can be  washed in the washing machine 2-3 x per month with some bleach  Use Lysol in shoes that cannot be washed and wipe out with a cloth 1 x per week-allow to dry for 8 hours before wearing again  Use a bleach solution (1 part bleach to 3 parts water) in your tub or shower to reduce the spread of the virus to yourself and others  Use aqua socks or clean sandals when at the pool or locker room to reduce the chance of picking up the virus or spreading it to others  

## 2019-02-21 ENCOUNTER — Telehealth: Payer: Self-pay | Admitting: *Deleted

## 2019-02-21 NOTE — Telephone Encounter (Signed)
I called Ryan Jones and informed that pt should soak in 3-4 squirts of antibiotic soap like Dial in a quart of warm water, for 20 minutes then rinse and apply antibiotic ointment bandaid until seen again.

## 2019-02-21 NOTE — Telephone Encounter (Signed)
Pt's wife, Helene Kelp asked if could use epsom salt or white vinegar, because he is a diabetic.

## 2019-02-27 ENCOUNTER — Telehealth: Payer: Self-pay | Admitting: Podiatry

## 2019-02-27 NOTE — Telephone Encounter (Signed)
Ryan Jones called to inquire about the results of his biopsy.

## 2019-03-01 NOTE — Telephone Encounter (Signed)
Benign wart tissue

## 2019-03-02 NOTE — Telephone Encounter (Signed)
Left message requesting pt call for results, and that I would try to reach him on his home phone.

## 2019-03-02 NOTE — Telephone Encounter (Signed)
Left message on home phone to call for results.

## 2019-03-02 NOTE — Telephone Encounter (Signed)
Pt called and I informed of Dr Mellody Drown review of biopsy results.

## 2019-03-03 DIAGNOSIS — J3089 Other allergic rhinitis: Secondary | ICD-10-CM | POA: Diagnosis not present

## 2019-03-03 DIAGNOSIS — J301 Allergic rhinitis due to pollen: Secondary | ICD-10-CM | POA: Diagnosis not present

## 2019-03-10 DIAGNOSIS — J301 Allergic rhinitis due to pollen: Secondary | ICD-10-CM | POA: Diagnosis not present

## 2019-03-10 DIAGNOSIS — J3089 Other allergic rhinitis: Secondary | ICD-10-CM | POA: Diagnosis not present

## 2019-03-13 DIAGNOSIS — J0140 Acute pansinusitis, unspecified: Secondary | ICD-10-CM | POA: Diagnosis not present

## 2019-03-13 DIAGNOSIS — Z20828 Contact with and (suspected) exposure to other viral communicable diseases: Secondary | ICD-10-CM | POA: Diagnosis not present

## 2019-03-13 DIAGNOSIS — J069 Acute upper respiratory infection, unspecified: Secondary | ICD-10-CM | POA: Diagnosis not present

## 2019-03-21 DIAGNOSIS — J3089 Other allergic rhinitis: Secondary | ICD-10-CM | POA: Diagnosis not present

## 2019-03-21 DIAGNOSIS — J301 Allergic rhinitis due to pollen: Secondary | ICD-10-CM | POA: Diagnosis not present

## 2019-04-06 DIAGNOSIS — J3089 Other allergic rhinitis: Secondary | ICD-10-CM | POA: Diagnosis not present

## 2019-04-06 DIAGNOSIS — J301 Allergic rhinitis due to pollen: Secondary | ICD-10-CM | POA: Diagnosis not present

## 2019-04-10 DIAGNOSIS — J301 Allergic rhinitis due to pollen: Secondary | ICD-10-CM | POA: Diagnosis not present

## 2019-04-10 DIAGNOSIS — J3089 Other allergic rhinitis: Secondary | ICD-10-CM | POA: Diagnosis not present

## 2019-04-17 DIAGNOSIS — J3089 Other allergic rhinitis: Secondary | ICD-10-CM | POA: Diagnosis not present

## 2019-04-17 DIAGNOSIS — J301 Allergic rhinitis due to pollen: Secondary | ICD-10-CM | POA: Diagnosis not present

## 2019-08-06 ENCOUNTER — Encounter (HOSPITAL_BASED_OUTPATIENT_CLINIC_OR_DEPARTMENT_OTHER): Payer: Self-pay | Admitting: Emergency Medicine

## 2019-08-06 ENCOUNTER — Other Ambulatory Visit: Payer: Self-pay

## 2019-08-06 ENCOUNTER — Emergency Department (HOSPITAL_BASED_OUTPATIENT_CLINIC_OR_DEPARTMENT_OTHER)
Admission: EM | Admit: 2019-08-06 | Discharge: 2019-08-06 | Disposition: A | Discharge status: Home / Self Care | Payer: Federal, State, Local not specified - PPO | Attending: Emergency Medicine | Admitting: Emergency Medicine

## 2019-08-06 ENCOUNTER — Emergency Department (HOSPITAL_BASED_OUTPATIENT_CLINIC_OR_DEPARTMENT_OTHER): Payer: Federal, State, Local not specified - PPO

## 2019-08-06 DIAGNOSIS — Z79899 Other long term (current) drug therapy: Secondary | ICD-10-CM | POA: Diagnosis not present

## 2019-08-06 DIAGNOSIS — Z7984 Long term (current) use of oral hypoglycemic drugs: Secondary | ICD-10-CM | POA: Diagnosis not present

## 2019-08-06 DIAGNOSIS — E11319 Type 2 diabetes mellitus with unspecified diabetic retinopathy without macular edema: Secondary | ICD-10-CM | POA: Diagnosis not present

## 2019-08-06 DIAGNOSIS — S20212A Contusion of left front wall of thorax, initial encounter: Secondary | ICD-10-CM | POA: Diagnosis not present

## 2019-08-06 DIAGNOSIS — E1121 Type 2 diabetes mellitus with diabetic nephropathy: Secondary | ICD-10-CM | POA: Insufficient documentation

## 2019-08-06 DIAGNOSIS — I10 Essential (primary) hypertension: Secondary | ICD-10-CM | POA: Insufficient documentation

## 2019-08-06 DIAGNOSIS — Y9389 Activity, other specified: Secondary | ICD-10-CM | POA: Diagnosis not present

## 2019-08-06 DIAGNOSIS — W19XXXA Unspecified fall, initial encounter: Secondary | ICD-10-CM | POA: Diagnosis not present

## 2019-08-06 DIAGNOSIS — S299XXA Unspecified injury of thorax, initial encounter: Secondary | ICD-10-CM | POA: Diagnosis present

## 2019-08-06 DIAGNOSIS — F17290 Nicotine dependence, other tobacco product, uncomplicated: Secondary | ICD-10-CM | POA: Insufficient documentation

## 2019-08-06 DIAGNOSIS — Y999 Unspecified external cause status: Secondary | ICD-10-CM | POA: Insufficient documentation

## 2019-08-06 DIAGNOSIS — Y9289 Other specified places as the place of occurrence of the external cause: Secondary | ICD-10-CM | POA: Diagnosis not present

## 2019-08-06 MED ORDER — KETOROLAC TROMETHAMINE 60 MG/2ML IM SOLN
60.0000 mg | Freq: Once | INTRAMUSCULAR | Status: AC
  Administered 2019-08-06: 12:00:00 60 mg via INTRAMUSCULAR
  Filled 2019-08-06: qty 2

## 2019-08-06 MED ORDER — OXYCODONE HCL 5 MG PO TABS: 5.0000 mg | ORAL_TABLET | Freq: Four times a day (QID) | ORAL | 0 refills | Status: DC | PRN

## 2019-08-06 NOTE — Discharge Instructions (Signed)
Recommend 1000 mg of Tylenol 4 times a day for pain, consider over-the-counter lidocaine patches.  Use narcotic pain medicine prescription as needed.  There are no rib fractures.  Likely you have a bone bruise.

## 2019-08-06 NOTE — ED Provider Notes (Signed)
[facility] EMERGENCY DEPARTMENT Provider Note   CSN: [id number] Arrival date & time: 08/06/19  1142     History Chief Complaint  Patient presents with  . Fall    rib pain    Ryan Jones is a 64 y.o. male.  Patient with fall 3 days ago landed on the left side of his ribs.  Pain with inspiration and movement since.  Not on blood thinners.  No loss of consciousness.  No abdominal pain.  Sent from urgent care as concern for other injuries.  The history is provided by the patient.  Fall This is a new problem. The current episode started more than 2 days ago. The problem has not changed since onset.Associated symptoms include chest pain. Pertinent negatives include no abdominal pain and no shortness of breath. Associated symptoms comments: Left rib pain . Exacerbated by: movement. Nothing relieves the symptoms. He has tried nothing for the symptoms. The treatment provided mild relief.       Past Medical History:  Diagnosis Date  . Arthritis   . Atrial flutter ([place]) 02/26/11   s/p RFCA 02/2011  . Bronchitis    h/o recurrent bronchitis; "usually get it 1-2X/wy; last time 12/2010"  . 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 Constipation   . Diabetes mellitus    "pills"  . Diarrhea   . GERD (gastroesophageal reflux disease)   . Gout    "get it in my hands and feet"  . Gout   . Headache    sinus headache  . Hemorrhoids   . Hypertension   . Kidney stone    1999  . Seasonal allergies 02/26/11    "I take an allergy shot q week"  . Sleep apnea    no cpap used much since nasal surgery yrs ago    Patient Active Problem List   Diagnosis Date Noted  . GERD (gastroesophageal reflux disease) 12/02/2018  . Gout 12/02/2018  . Right otitis media 07/09/2017  . TMJ (temporomandibular joint syndrome) 07/09/2017  . Diabetic retinopathy ([place]) 06/25/2016  . Diabetic nephropathy ([place]) 06/10/2016  . Diabetes mellitus, type 2 ([place]) 12/10/2014  . Chest pain, atypical  05/25/2011  . Obesity 04/03/2011  . Essential hypertension, benign 05/15/2008    Past Surgical History:  Procedure Laterality Date  . ATRIAL FLUTTER ABLATION N/A 02/26/2011   Procedure: ATRIAL FLUTTER ABLATION;  Surgeon: Deboraha Sprang, MD;  Location: [facility] CATH LAB;  Service: Cardiovascular;  Laterality: N/A;  . BACK SURGERY  before 1995 and in 1995;    "ruptured disc repaired; lower back"  . CARDIAC ELECTROPHYSIOLOGY STUDY AND ABLATION  02/26/2011   for atrial fib  . COLONOSCOPY WITH PROPOFOL N/A 01/18/2015   Procedure: COLONOSCOPY WITH PROPOFOL;  Surgeon: Carol Ada, MD;  Location: [facility] ENDOSCOPY;  Service: Endoscopy;  Laterality: N/A;  . COLONOSCOPY WITH PROPOFOL N/A 02/04/2018   Procedure: COLONOSCOPY WITH PROPOFOL;  Surgeon: Carol Ada, MD;  Location: [facility] ENDOSCOPY;  Service: Endoscopy;  Laterality: N/A;  . LUMBAR [place]  ~ 1993   "put foam in back; in place of disc"  . LUMBAR DISC SURGERY  04/1993   "replaced gel foam that had blown out"  . nasal septum surgery to open up sinuses  yrs ago   dr Simeon Craft  . POLYPECTOMY  02/04/2018   Procedure: POLYPECTOMY;  Surgeon: Carol Ada, MD;  Location: [facility] ENDOSCOPY;  Service: Endoscopy;;  . SKIN CANCER EXCISION  2011   right ear  . squamous cell  area removed from right arm  6 weeks ago       No family history on file.  Social History   Tobacco Use  . Smoking status: Never Smoker  . Smokeless tobacco: Current User    Types: Snuff  . Tobacco comment: "stopped cigars ~ 2002"  Substance Use Topics  . Alcohol use: No  . Drug use: No    Home Medications Prior to Admission medications   Medication Sig Start Date End Date Taking? Authorizing Provider  albuterol (PROVENTIL HFA;VENTOLIN HFA) 108 (90 Base) MCG/ACT inhaler Inhale 2 puffs into the lungs every 6 (six) hours as needed. For shortness of breath. 06/24/15   Wardell Honour, MD  amLODipine (NORVASC) 5 MG tablet Take 1 tablet (5 mg total) by mouth daily. Patient taking  differently: Take 5 mg by mouth daily at 6 PM. 1730 10/27/16   Timmothy Euler, MD  glipiZIDE (GLUCOTROL) 5 MG tablet Take 5 mg by mouth 2 (two) times daily. 1200 & 2330 11/17/17   [provider]  hydrochlorothiazide (HYDRODIURIL) 25 MG tablet Take 1 tablet (25 mg total) by mouth daily. 02/15/18   Eustaquio Maize, MD  ibuprofen (ADVIL,MOTRIN) 800 MG tablet Take 800 mg by mouth every 8 (eight) hours as needed (for pain.).  05/31/17   [provider]  indomethacin (INDOCIN) 50 MG capsule TAKE 1 CAPSULE BY MOUTH THREE TIMES DAILY AS NEEDED FOR MODERATE PAIN (GOUT) Patient taking differently: Take 50 mg by mouth 3 (three) times daily as needed (for pain.).  02/01/17   Timmothy Euler, MD  losartan (COZAAR) 100 MG tablet TAKE 1 TABLET BY MOUTH EVERY DAY Patient taking differently: Take 100 mg by mouth daily at 6 PM. 1730 01/18/17   Timmothy Euler, MD  metFORMIN (GLUCOPHAGE) 1000 MG tablet Take 1 tablet (1,000 mg total) by mouth 2 (two) times daily with a meal. Patient taking differently: Take 1,000 mg by mouth 2 (two) times daily with a meal. 1730 & 2330 06/08/16   Timmothy Euler, MD  mometasone (NASONEX) 50 MCG/ACT nasal spray 2 sprays each nares once daily Patient taking differently: Place 2 sprays into the nose daily as needed (for allergies.).  07/26/15   Timmothy Euler, MD  Multiple Vitamin (MULTIVITAMIN WITH MINERALS) TABS tablet Take 1 tablet by mouth daily at 6 PM. 1730    [provider]  NON FORMULARY once a week. Allergy shot    [provider]  omeprazole (PRILOSEC) 20 MG capsule TAKE 1 CAPSULE BY MOUTH EVERY DAY Patient taking differently: Take 40 mg by mouth daily at 6 PM.  01/11/17   Timmothy Euler, MD  oxyCODONE (ROXICODONE) 5 MG immediate release tablet Take 1 tablet (5 mg total) by mouth every 6 (six) hours as needed for up to 10 doses for severe pain. 08/06/19   Gitel Beste, DO  pioglitazone (ACTOS) 15 MG tablet Take 30 mg by mouth  daily at 12 noon.  06/27/17   [provider]  UNABLE TO FIND Allergy shots every 1-2 weeks labuer dr Fredderick Phenix    [provider]    Allergies    Amoxicillin-pot clavulanate and Codeine  Review of Systems   Review of Systems  Constitutional: Negative for chills and fever.  HENT: Negative for ear pain and sore throat.   Eyes: Negative for pain and visual disturbance.  Respiratory: Negative for cough and shortness of breath.   Cardiovascular: Positive for chest pain. Negative for palpitations.  Gastrointestinal: Negative  for abdominal pain and vomiting.  Genitourinary: Negative for dysuria and hematuria.  Musculoskeletal: Negative for arthralgias and back pain.  Skin: Negative for color change and rash.  Neurological: Negative for seizures and syncope.  All other systems reviewed and are negative.   Physical Exam Updated Vital Signs  ED Triage Vitals [08/06/19 1153]  Enc Vitals Group     BP (!) 149/95     Pulse Rate 74     Resp 18     Temp      Temp src      SpO2 98 %     Weight (!) 351 lb (159.2 kg)     Height 5\' 9"  (1.753 m)     Head Circumference      Peak Flow      Pain Score      Pain Loc      Pain Edu?      Excl. in [place]?     Physical Exam Vitals and nursing note reviewed.  Constitutional:      Appearance: He is well-developed.  HENT:     Head: Normocephalic and atraumatic.     Nose: Nose normal.     Mouth/Throat:     Mouth: Mucous membranes are moist.  Eyes:     Extraocular Movements: Extraocular movements intact.     Conjunctiva/sclera: Conjunctivae normal.     Pupils: Pupils are equal, round, and reactive to light.  Cardiovascular:     Rate and Rhythm: Normal rate and regular rhythm.     Pulses: Normal pulses.     Heart sounds: Normal heart sounds. No murmur.  Pulmonary:     Effort: Pulmonary effort is normal. No respiratory distress.     Breath sounds: Normal breath sounds.  Abdominal:     General: There is no distension.      Palpations: Abdomen is soft. There is no mass.     Tenderness: There is no abdominal tenderness. There is no guarding or rebound.     Hernia: No hernia is present.  Musculoskeletal:        General: Tenderness present. Normal range of motion.     Cervical back: Normal range of motion and neck supple.     Comments: Tenderness to left-sided anterior ribs  Skin:    General: Skin is warm and dry.     Capillary Refill: Capillary refill takes less than 2 seconds.     Comments: No bruising to the chest wall or abdomen, no flank bruising  Neurological:     General: No focal deficit present.     Mental Status: He is alert.  Psychiatric:        Mood and Affect: Mood normal.     ED Results / Procedures / Treatments   Labs (all labs ordered are listed, but only abnormal results are displayed) Labs Reviewed - No data to display  EKG None  Radiology DG Chest 2 View  Result Date: 08/06/2019 CLINICAL DATA:  Left rib pain after fall 2 days ago. EXAM: CHEST - 2 VIEW COMPARISON:  June 13, 2011. FINDINGS: The heart size and mediastinal contours are within normal limits. Both lungs are clear. No pneumothorax or pleural effusion is noted. The visualized skeletal structures are unremarkable. IMPRESSION: No active cardiopulmonary disease. Electronically Signed   By: Marijo Conception M.D.   On: 08/06/2019 12:54    Procedures Procedures (including critical care time)  Medications Ordered in ED Medications  ketorolac (TORADOL) injection 60 mg (60 mg Intramuscular Given  08/06/19 1213)    ED Course  I have reviewed the triage vital signs and the nursing notes.  Pertinent labs & imaging results that were available during my care of the patient were reviewed by me and considered in my medical decision making (see chart for details).    MDM Rules/Calculators/A&P                      RHIAN YEARTA is a 63 year old male history of atrial flutter who presents to the ED with rib pain after a fall 3  days ago.  Normal vitals.  No fever.  Was seen at an urgent care and was sent for further evaluation because they saw may be blood in his urine.  Patient however has no abdominal pain.  No flank pain.  No bruising to his abdomen or chest wall. Doubt intraabominal injury given H and P and normal vitals. Not on blood thinner.  Has left-sided rib pain likely from contusion.  Chest x-ray here shows no fractures or pneumothorax.  However will prescribe short course of narcotic for breakthrough pain as patient was told that he needs to avoid NSAIDs.  Recommend over-the-counter lidocaine patch and Tylenol as well.  Discharged in good condition.  Understands return precautions.  This chart was dictated using voice recognition software.  Despite best efforts to proofread,  errors can occur which can change the documentation meaning.    Final Clinical Impression(s) / ED Diagnoses Final diagnoses:  Contusion of rib on left side, initial encounter    Rx / DC Orders ED Discharge Orders         Ordered    oxyCODONE (ROXICODONE) 5 MG immediate release tablet  Every 6 hours PRN     08/06/19 1227           Lennice Sites, DO 08/06/19 1258

## 2019-08-06 NOTE — ED Notes (Signed)
ED Provider at bedside. 

## 2019-08-06 NOTE — ED Triage Notes (Signed)
States fell on Friday, tripped while loading a trailer. No LOC, landed on left side, having left rib/side pain. Eval by PCP today and referred to ED, had CXR done today. Has been SOB at rest and on exertion

## 2020-02-18 ENCOUNTER — Other Ambulatory Visit: Payer: Self-pay

## 2020-02-18 ENCOUNTER — Encounter (HOSPITAL_COMMUNITY): Payer: Self-pay | Admitting: Emergency Medicine

## 2020-02-18 ENCOUNTER — Emergency Department (HOSPITAL_COMMUNITY)
Admission: EM | Admit: 2020-02-18 | Discharge: 2020-02-18 | Disposition: A | Discharge status: Home / Self Care | Payer: Federal, State, Local not specified - PPO | Attending: Emergency Medicine | Admitting: Emergency Medicine

## 2020-02-18 DIAGNOSIS — K219 Gastro-esophageal reflux disease without esophagitis: Secondary | ICD-10-CM | POA: Diagnosis not present

## 2020-02-18 DIAGNOSIS — E11319 Type 2 diabetes mellitus with unspecified diabetic retinopathy without macular edema: Secondary | ICD-10-CM | POA: Diagnosis not present

## 2020-02-18 DIAGNOSIS — L02211 Cutaneous abscess of abdominal wall: Secondary | ICD-10-CM | POA: Diagnosis not present

## 2020-02-18 DIAGNOSIS — Z85828 Personal history of other malignant neoplasm of skin: Secondary | ICD-10-CM | POA: Diagnosis not present

## 2020-02-18 DIAGNOSIS — R509 Fever, unspecified: Secondary | ICD-10-CM | POA: Insufficient documentation

## 2020-02-18 DIAGNOSIS — Z79899 Other long term (current) drug therapy: Secondary | ICD-10-CM | POA: Insufficient documentation

## 2020-02-18 DIAGNOSIS — E1121 Type 2 diabetes mellitus with diabetic nephropathy: Secondary | ICD-10-CM | POA: Diagnosis not present

## 2020-02-18 DIAGNOSIS — R1030 Lower abdominal pain, unspecified: Secondary | ICD-10-CM | POA: Diagnosis present

## 2020-02-18 DIAGNOSIS — Z7984 Long term (current) use of oral hypoglycemic drugs: Secondary | ICD-10-CM | POA: Insufficient documentation

## 2020-02-18 DIAGNOSIS — I10 Essential (primary) hypertension: Secondary | ICD-10-CM | POA: Insufficient documentation

## 2020-02-18 DIAGNOSIS — E1165 Type 2 diabetes mellitus with hyperglycemia: Secondary | ICD-10-CM

## 2020-02-18 LAB — CBG MONITORING, ED: Glucose-Capillary: 300 mg/dL — ABNORMAL HIGH (ref 70–99)

## 2020-02-18 MED ORDER — HYDROCODONE-ACETAMINOPHEN 5-325 MG PO TABS: 1.0000 | ORAL_TABLET | ORAL | 0 refills | Status: DC | PRN

## 2020-02-18 MED ORDER — HYDROCODONE-ACETAMINOPHEN 5-325 MG PO TABS
1.0000 | ORAL_TABLET | Freq: Once | ORAL | Status: AC
  Administered 2020-02-18: 1 via ORAL
  Filled 2020-02-18: qty 1

## 2020-02-18 MED ORDER — SULFAMETHOXAZOLE-TRIMETHOPRIM 800-160 MG PO TABS
1.0000 | ORAL_TABLET | Freq: Once | ORAL | Status: AC
  Administered 2020-02-18: 1 via ORAL
  Filled 2020-02-18: qty 1

## 2020-02-18 MED ORDER — LIDOCAINE-EPINEPHRINE (PF) 2 %-1:200000 IJ SOLN
10.0000 mL | Freq: Once | INTRAMUSCULAR | Status: AC
  Administered 2020-02-18: 10 mL
  Filled 2020-02-18: qty 10

## 2020-02-18 MED ORDER — SULFAMETHOXAZOLE-TRIMETHOPRIM 800-160 MG PO TABS: 1.0000 | ORAL_TABLET | Freq: Two times a day (BID) | ORAL | 0 refills | Status: AC

## 2020-02-18 NOTE — ED Triage Notes (Signed)
Pt reports lower abdominal since Tuesday and reports "knot" appeared on Thursday night and low grade fevers and elevated cbg. Pt reports intense pain. Pt reports LBM this am, pt able to void but reports discomfort. Pt denies any recent heavy lifting, v/d.

## 2020-02-18 NOTE — ED Provider Notes (Signed)
[facility] EMERGENCY DEPARTMENT Provider Note   CSN: [id number] Arrival date & time: 02/18/20  4097     History Chief Complaint  Patient presents with  . Abdominal Pain    Ryan Jones is a 64 y.o. male.  Pt presents to the ED today with a knot to his lower abdomen.  Pt said he developed sx on Tuesday, 10/26.  The "knot" appeared on the 28th.  Pain worse this am.  Low grade fevers.  Elevated blood sugars.        Past Medical History:  Diagnosis Date  . Arthritis   . Atrial flutter ([place]) 02/26/11   s/p RFCA 02/2011  . Bronchitis    h/o recurrent bronchitis; "usually get it 1-2X/wy; last time 12/2010"  . 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 Constipation   . Diabetes mellitus    "pills"  . Diarrhea   . GERD (gastroesophageal reflux disease)   . Gout    "get it in my hands and feet"  . Gout   . Headache    sinus headache  . Hemorrhoids   . Hypertension   . Kidney stone    1999  . Seasonal allergies 02/26/11    "I take an allergy shot q week"  . Sleep apnea    no cpap used much since nasal surgery yrs ago    Patient Active Problem List   Diagnosis Date Noted  . GERD (gastroesophageal reflux disease) 12/02/2018  . Gout 12/02/2018  . Right otitis media 07/09/2017  . TMJ (temporomandibular joint syndrome) 07/09/2017  . Diabetic retinopathy ([place]) 06/25/2016  . Diabetic nephropathy ([place]) 06/10/2016  . Diabetes mellitus, type 2 ([place]) 12/10/2014  . Chest pain, atypical 05/25/2011  . Obesity 04/03/2011  . Essential hypertension, benign 05/15/2008    Past Surgical History:  Procedure Laterality Date  . ATRIAL FLUTTER ABLATION N/A 02/26/2011   Procedure: ATRIAL FLUTTER ABLATION;  Surgeon: Deboraha Sprang, MD;  Location: [facility] CATH LAB;  Service: Cardiovascular;  Laterality: N/A;  . BACK SURGERY  before 1995 and in 1995;    "ruptured disc repaired; lower back"  . CARDIAC ELECTROPHYSIOLOGY STUDY AND ABLATION  02/26/2011   for atrial fib  . COLONOSCOPY WITH  PROPOFOL N/A 01/18/2015   Procedure: COLONOSCOPY WITH PROPOFOL;  Surgeon: Carol Ada, MD;  Location: [facility] ENDOSCOPY;  Service: Endoscopy;  Laterality: N/A;  . COLONOSCOPY WITH PROPOFOL N/A 02/04/2018   Procedure: COLONOSCOPY WITH PROPOFOL;  Surgeon: Carol Ada, MD;  Location: [facility] ENDOSCOPY;  Service: Endoscopy;  Laterality: N/A;  . LUMBAR [place]  ~ 1993   "put foam in back; in place of disc"  . LUMBAR DISC SURGERY  04/1993   "replaced gel foam that had blown out"  . nasal septum surgery to open up sinuses  yrs ago   dr Simeon Craft  . POLYPECTOMY  02/04/2018   Procedure: POLYPECTOMY;  Surgeon: Carol Ada, MD;  Location: [facility] ENDOSCOPY;  Service: Endoscopy;;  . SKIN CANCER EXCISION  2011   right ear  . squamous cell area removed from right arm  6 weeks ago       History reviewed. No pertinent family history.  Social History   Tobacco Use  . Smoking status: Never Smoker  . Smokeless tobacco: Current User    Types: Snuff  . Tobacco comment: "stopped cigars ~ 2002"  Vaping Use  . Vaping Use: Never used  Substance Use Topics  . Alcohol use: No  . Drug use: No  Home Medications Prior to Admission medications   Medication Sig Start Date End Date Taking? Authorizing Provider  albuterol (PROVENTIL HFA;VENTOLIN HFA) 108 (90 Base) MCG/ACT inhaler Inhale 2 puffs into the lungs every 6 (six) hours as needed. For shortness of breath. 06/24/15   Wardell Honour, MD  amLODipine (NORVASC) 5 MG tablet Take 1 tablet (5 mg total) by mouth daily. Patient taking differently: Take 5 mg by mouth daily at 6 PM. 1730 10/27/16   Timmothy Euler, MD  glipiZIDE (GLUCOTROL) 5 MG tablet Take 5 mg by mouth 2 (two) times daily. 1200 & 2330 11/17/17   [provider]  hydrochlorothiazide (HYDRODIURIL) 25 MG tablet Take 1 tablet (25 mg total) by mouth daily. 02/15/18   Eustaquio Maize, MD  HYDROcodone-acetaminophen (NORCO/VICODIN) 5-325 MG tablet Take 1 tablet by mouth every 4 (four) hours as  needed. 02/18/20   Isla Pence, MD  ibuprofen (ADVIL,MOTRIN) 800 MG tablet Take 800 mg by mouth every 8 (eight) hours as needed (for pain.).  05/31/17   [provider]  indomethacin (INDOCIN) 50 MG capsule TAKE 1 CAPSULE BY MOUTH THREE TIMES DAILY AS NEEDED FOR MODERATE PAIN (GOUT) Patient taking differently: Take 50 mg by mouth 3 (three) times daily as needed (for pain.).  02/01/17   Timmothy Euler, MD  losartan (COZAAR) 100 MG tablet TAKE 1 TABLET BY MOUTH EVERY DAY Patient taking differently: Take 100 mg by mouth daily at 6 PM. 1730 01/18/17   Timmothy Euler, MD  metFORMIN (GLUCOPHAGE) 1000 MG tablet Take 1 tablet (1,000 mg total) by mouth 2 (two) times daily with a meal. Patient taking differently: Take 1,000 mg by mouth 2 (two) times daily with a meal. 1730 & 2330 06/08/16   Timmothy Euler, MD  mometasone (NASONEX) 50 MCG/ACT nasal spray 2 sprays each nares once daily Patient taking differently: Place 2 sprays into the nose daily as needed (for allergies.).  07/26/15   Timmothy Euler, MD  Multiple Vitamin (MULTIVITAMIN WITH MINERALS) TABS tablet Take 1 tablet by mouth daily at 6 PM. 1730    [provider]  NON FORMULARY once a week. Allergy shot    [provider]  omeprazole (PRILOSEC) 20 MG capsule TAKE 1 CAPSULE BY MOUTH EVERY DAY Patient taking differently: Take 40 mg by mouth daily at 6 PM.  01/11/17   Timmothy Euler, MD  oxyCODONE (ROXICODONE) 5 MG immediate release tablet Take 1 tablet (5 mg total) by mouth every 6 (six) hours as needed for up to 10 doses for severe pain. 08/06/19   Curatolo, Adam, DO  pioglitazone (ACTOS) 15 MG tablet Take 30 mg by mouth daily at 12 noon.  06/27/17   [provider]  sulfamethoxazole-trimethoprim (BACTRIM DS) 800-160 MG tablet Take 1 tablet by mouth 2 (two) times daily for 7 days. 02/18/20 02/25/20  Isla Pence, MD  UNABLE TO FIND Allergy shots every 1-2 weeks labuer dr Fredderick Phenix    [provider]    Allergies    Amoxicillin-pot clavulanate and Codeine  Review of Systems   Review of Systems  Skin: Positive for wound.  All other systems reviewed and are negative.   Physical Exam Updated Vital Signs BP (!) 168/98 (BP Location: Right Arm)   Pulse (!) 106   Temp 98.7 F (37.1 C) (Oral)   Resp 18   Ht 5' 9.5" (1.765 m)   Wt (!) 160.6 kg   SpO2 97%   BMI 51.53 kg/m  Physical Exam Vitals and nursing note reviewed.  Constitutional:      Appearance: He is well-developed. He is obese.  HENT:     Head: Normocephalic and atraumatic.     Mouth/Throat:     Mouth: Mucous membranes are moist.  Eyes:     Extraocular Movements: Extraocular movements intact.     Pupils: Pupils are equal, round, and reactive to light.  Cardiovascular:     Rate and Rhythm: Normal rate and regular rhythm.  Abdominal:     General: Abdomen is flat. Bowel sounds are normal.     Palpations: Abdomen is soft.  Skin:    General: Skin is warm.     Capillary Refill: Capillary refill takes less than 2 seconds.     Comments: Abscess under pannus.  Surrounding cellulitis that does not extend to the groin.  Neurological:     General: No focal deficit present.     Mental Status: He is alert and oriented to person, place, and time.  Psychiatric:        Mood and Affect: Mood normal.        Behavior: Behavior normal.     ED Results / Procedures / Treatments   Labs (all labs ordered are listed, but only abnormal results are displayed) Labs Reviewed  CBG MONITORING, ED - Abnormal; Notable for the following components:      Result Value   Glucose-Capillary 300 (*)    All other components within normal limits    EKG None  Radiology No results found.  Procedures .Marland KitchenIncision and Drainage  Date/Time: 02/18/2020 11:37 AM Performed by: Isla Pence, MD Authorized by: Isla Pence, MD   Consent:    Consent obtained:  Verbal   Consent given by:  Patient   Risks discussed:   Bleeding, incomplete drainage and pain Location:    Type:  Abscess   Size:  2   Location:  Trunk   Trunk location:  Abdomen Pre-procedure details:    Skin preparation:  Hibiclens Anesthesia (see MAR for exact dosages):    Anesthesia method:  Local infiltration   Local anesthetic:  Lidocaine 2% WITH epi Procedure type:    Complexity:  Simple Procedure details:    Incision types:  Cruciate   Scalpel blade:  11   Wound management:  Probed and deloculated   Drainage:  Purulent   Drainage amount:  Moderate   Wound treatment:  Wound left open   Packing materials:  None Post-procedure details:    Patient tolerance of procedure:  Tolerated well, no immediate complications   (including critical care time)  Medications Ordered in ED Medications  lidocaine-EPINEPHrine (XYLOCAINE W/EPI) 2 %-1:200000 (PF) injection 10 mL (10 mLs Infiltration Given 02/18/20 1049)  HYDROcodone-acetaminophen (NORCO/VICODIN) 5-325 MG per tablet 1 tablet (1 tablet Oral Given 02/18/20 1049)  sulfamethoxazole-trimethoprim (BACTRIM DS) 800-160 MG per tablet 1 tablet (1 tablet Oral Given 02/18/20 1049)    ED Course  I have reviewed the triage vital signs and the nursing notes.  Pertinent labs & imaging results that were available during my care of the patient were reviewed by me and considered in my medical decision making (see chart for details).    MDM Rules/Calculators/A&P                         BS is elevated.  Pt said his doctor has been trying to get his diabetic medication regimen correct.  His lowest bs in the last several weeks  was 199.  He is encouraged to eat a strict diabetic diet to keep his sugar down. He is told to return if sx worsen.  I showed his wife the cellulitis (b/c pt can't see it) and so she knows what to look for.  Pt is to return if worse.  F/u with pcp.  Final Clinical Impression(s) / ED Diagnoses Final diagnoses:  Abdominal wall abscess  Poorly controlled type 2 diabetes mellitus  ([place])    Rx / DC Orders ED Discharge Orders         Ordered    sulfamethoxazole-trimethoprim (BACTRIM DS) 800-160 MG tablet  2 times daily        02/18/20 1125    HYDROcodone-acetaminophen (NORCO/VICODIN) 5-325 MG tablet  Every 4 hours PRN        02/18/20 1125           Isla Pence, MD 02/18/20 1139

## 2020-02-18 NOTE — Discharge Instructions (Signed)
Return if worse.  Strict diabetic diet.

## 2020-02-18 NOTE — ED Notes (Signed)
Dressing applied. 

## 2020-05-15 ENCOUNTER — Other Ambulatory Visit: Payer: Self-pay | Admitting: Physician Assistant

## 2020-05-15 DIAGNOSIS — R0989 Other specified symptoms and signs involving the circulatory and respiratory systems: Secondary | ICD-10-CM

## 2020-05-17 ENCOUNTER — Ambulatory Visit
Admission: RE | Admit: 2020-05-17 | Discharge: 2020-05-17 | Disposition: A | Discharge status: Home / Self Care | Payer: Federal, State, Local not specified - PPO | Source: Ambulatory Visit | Attending: Physician Assistant | Admitting: Physician Assistant

## 2020-05-17 DIAGNOSIS — R0989 Other specified symptoms and signs involving the circulatory and respiratory systems: Secondary | ICD-10-CM

## 2021-03-18 ENCOUNTER — Ambulatory Visit: Payer: Federal, State, Local not specified - PPO | Admitting: Cardiovascular Disease

## 2021-05-27 ENCOUNTER — Ambulatory Visit (INDEPENDENT_AMBULATORY_CARE_PROVIDER_SITE_OTHER): Payer: Medicare Other | Admitting: Dermatology

## 2021-05-27 ENCOUNTER — Other Ambulatory Visit: Payer: Self-pay

## 2021-05-27 DIAGNOSIS — L820 Inflamed seborrheic keratosis: Secondary | ICD-10-CM | POA: Diagnosis not present

## 2021-05-27 DIAGNOSIS — D1801 Hemangioma of skin and subcutaneous tissue: Secondary | ICD-10-CM | POA: Diagnosis not present

## 2021-05-27 DIAGNOSIS — D485 Neoplasm of uncertain behavior of skin: Secondary | ICD-10-CM

## 2021-05-27 DIAGNOSIS — L570 Actinic keratosis: Secondary | ICD-10-CM

## 2021-05-27 DIAGNOSIS — Z1283 Encounter for screening for malignant neoplasm of skin: Secondary | ICD-10-CM

## 2021-05-27 NOTE — Patient Instructions (Addendum)
[place] vs MounjaroBiopsy, Surgery (Curettage) & Surgery (Excision) Aftercare Instructions  1. Okay to remove bandage in 24 hours  2. Wash area with soap and water  3. Apply Vaseline to area twice daily until healed (Not Neosporin)  4. Okay to cover with a Band-Aid to decrease the chance of infection or prevent irritation from clothing; also it's okay to uncover lesion at home.  5. Suture instructions: return to our office in 7-10 or 10-14 days for a nurse visit for suture removal. Variable healing with sutures, if pain or itching occurs call our office. It's okay to shower or bathe 24 hours after sutures are given.  6. The following risks may occur after a biopsy, curettage or excision: bleeding, scarring, discoloration, recurrence, infection (redness, yellow drainage, pain or swelling).  7. For questions, concerns and results call our office at [place] before 4pm & Friday before 3pm. Biopsy results will be available in 1 week.

## 2021-06-03 NOTE — Progress Notes (Signed)
Macario Golds, PA Reason for referral-lower extremity edema  HPI: 66 year old male for evaluation of lower extremity edema at request of Adrienne Mocha, [place].  Patient does have a history of atrial flutter ablation in 2012.  Echocardiogram October 2012 showed normal LV function, moderate to severe left atrial enlargement, mild right atrial enlargement, mild right ventricular enlargement.  Carotid Dopplers January 2022 showed high-grade stenosis of the left external carotid artery, 1 to 49% right and left internal carotid artery.  Laboratories January 2023 showed hemoglobin 13.6, LDL 91, creatinine 1.15, mildly elevated AST at 57 and ALT at 63.  Recently noted to have lower extremity edema and cardiology asked to evaluate.  Patient does have some dyspnea on exertion but no orthopnea, PND, chest pain, palpitations or syncope.  Over the past 2 years he has noticed intermittent bilateral lower extremity edema.  It is worse towards the evening and improves overnight.  Cardiology now asked to evaluate.  Current Outpatient Medications  Medication Sig Dispense Refill   albuterol (PROVENTIL HFA;VENTOLIN HFA) 108 (90 Base) MCG/ACT inhaler Inhale 2 puffs into the lungs every 6 (six) hours as needed. For shortness of breath. 18 g 3   amLODipine (NORVASC) 5 MG tablet Take 1 tablet (5 mg total) by mouth daily. (Patient taking differently: Take 5 mg by mouth daily at 6 PM. 1730) 90 tablet 3   glipiZIDE (GLUCOTROL) 5 MG tablet Take 5 mg by mouth 2 (two) times daily. 1200 & 2330  3   hydrochlorothiazide (HYDRODIURIL) 25 MG tablet Take 1 tablet (25 mg total) by mouth daily. 90 tablet 0   HYDROcodone-acetaminophen (NORCO/VICODIN) 5-325 MG tablet Take 1 tablet by mouth every 4 (four) hours as needed. 10 tablet 0   ibuprofen (ADVIL,MOTRIN) 800 MG tablet Take 800 mg by mouth every 8 (eight) hours as needed (for pain.).   5   indomethacin (INDOCIN) 50 MG capsule TAKE 1 CAPSULE BY MOUTH THREE TIMES DAILY AS NEEDED  FOR MODERATE PAIN (GOUT) (Patient taking differently: Take 50 mg by mouth 3 (three) times daily as needed (for pain.).) 21 capsule 0   losartan (COZAAR) 100 MG tablet TAKE 1 TABLET BY MOUTH EVERY DAY (Patient taking differently: Take 100 mg by mouth daily at 6 PM. 1730) 90 tablet 1   metFORMIN (GLUCOPHAGE) 1000 MG tablet Take 1 tablet (1,000 mg total) by mouth 2 (two) times daily with a meal. (Patient taking differently: Take 1,000 mg by mouth 2 (two) times daily with a meal. 1730 & 2330) 180 tablet 3   mometasone (NASONEX) 50 MCG/ACT nasal spray 2 sprays each nares once daily (Patient taking differently: Place 2 sprays into the nose daily as needed (for allergies.).) 17 g 12   Multiple Vitamin (MULTIVITAMIN WITH MINERALS) TABS tablet Take 1 tablet by mouth daily at 6 PM. 1730     NON FORMULARY once a week. Allergy shot     omeprazole (PRILOSEC) 20 MG capsule TAKE 1 CAPSULE BY MOUTH EVERY DAY (Patient taking differently: Take 40 mg by mouth daily at 6 PM.) 30 capsule 5   pioglitazone (ACTOS) 15 MG tablet Take 30 mg by mouth daily at 12 noon.   0   pravastatin (PRAVACHOL) 40 MG tablet Take 40 mg by mouth daily.     Semaglutide, 1 MG/DOSE, (OZEMPIC, 1 MG/DOSE,) 2 MG/1.5ML SOPN Inject 1 mg into the skin. Wkly     UNABLE TO FIND Allergy shots every 1-2 weeks labuer dr Fredderick Phenix     No current facility-administered medications for  this visit.    Allergies  Allergen Reactions   Amoxicillin-Pot Clavulanate Other (See Comments)    Thrush and yeast infection Has patient had a PCN reaction causing immediate rash, facial/tongue/throat swelling, SOB or lightheadedness with hypotension: No Has patient had a PCN reaction causing severe rash involving mucus membranes or skin necrosis: No Has patient had a PCN reaction that required hospitalization: No Has patient had a PCN reaction occurring within the last 10 years: No If all of the above answers are "NO", then may proceed with Cephalosporin use.      Codeine Itching     Past Medical History:  Diagnosis Date   Arthritis    Atrial flutter ([place]) 02/26/2011   s/p RFCA 02/2011   Bronchitis    h/o recurrent bronchitis; "usually get it 1-2X/wy; last time 12/2010"   Cancer [facility]) 2011   right ear,basal   Constipation    Diabetes mellitus    "pills"   Diarrhea    GERD (gastroesophageal reflux disease)    Gout    "get it in my hands and feet"   Headache    sinus headache   Hemorrhoids    Hyperlipidemia    Hypertension    Kidney stone    1999   Seasonal allergies 02/26/2011   "I take an allergy shot q week"   Sleep apnea    no cpap used much since nasal surgery yrs ago    Past Surgical History:  Procedure Laterality Date   ATRIAL FLUTTER ABLATION N/A 02/26/2011   Procedure: ATRIAL FLUTTER ABLATION;  Surgeon: Deboraha Sprang, MD;  Location: [facility] CATH LAB;  Service: Cardiovascular;  Laterality: N/A;   BACK SURGERY  before 1995 and in 1995;    "ruptured disc repaired; lower back"   [place]  02/26/2011   for atrial fib   COLONOSCOPY WITH PROPOFOL N/A 01/18/2015   Procedure: COLONOSCOPY WITH PROPOFOL;  Surgeon: Carol Ada, MD;  Location: [facility] ENDOSCOPY;  Service: Endoscopy;  Laterality: N/A;   COLONOSCOPY WITH PROPOFOL N/A 02/04/2018   Procedure: COLONOSCOPY WITH PROPOFOL;  Surgeon: Carol Ada, MD;  Location: [facility] ENDOSCOPY;  Service: Endoscopy;  Laterality: N/A;   LUMBAR DISC SURGERY  ~ 1993   "put foam in back; in place of disc"   [place] SURGERY  04/1993   "replaced gel foam that had blown out"   nasal septum surgery to open up sinuses  yrs ago   dr Simeon Craft   POLYPECTOMY  02/04/2018   Procedure: POLYPECTOMY;  Surgeon: Carol Ada, MD;  Location: [facility] ENDOSCOPY;  Service: Endoscopy;;   SKIN CANCER EXCISION  2011   right ear   squamous cell area removed from right arm  6 weeks ago    Social History   Socioeconomic History   Marital status: Married    Spouse name: Not on file   Number  of children: 1   Years of education: Not on file   Highest education level: Not on file  Occupational History   Not on file  Tobacco Use   Smoking status: Never   Smokeless tobacco: Current    Types: Snuff   Tobacco comments:    "stopped cigars ~ 2002"  Vaping Use   Vaping Use: Never used  Substance and Sexual Activity   Alcohol use: No   Drug use: No   Sexual activity: Not on file  Other Topics Concern   Not on file  Social History Narrative   Not on file  Social Determinants of Health   Financial Resource Strain: Not on file  Food Insecurity: Not on file  Transportation Needs: Not on file  Physical Activity: Not on file  Stress: Not on file  Social Connections: Not on file  Intimate Partner Violence: Not on file    Family History  Problem Relation Age of Onset   Heart attack Father     ROS: no fevers or chills, productive cough, hemoptysis, dysphasia, odynophagia, melena, hematochezia, dysuria, hematuria, rash, seizure activity, orthopnea, PND, claudication. Remaining systems are negative.  Physical Exam:   Blood pressure 132/86, pulse 98, resp. rate 20, height 5\' 9"  (1.753 m), weight (!) 374 lb 3.2 oz (169.7 kg).  General:  Well developed/obese in NAD Skin warm/dry Patient not depressed No peripheral clubbing Back-normal HEENT-normal/normal eyelids Neck supple/normal carotid upstroke bilaterally; no bruits; no JVD; no thyromegaly chest - CTA/ normal expansion CV - RRR/normal S1 and S2; no murmurs, rubs or gallops;  PMI nondisplaced Abdomen -NT/ND, no HSM, no mass, + bowel sounds, no bruit 2+ femoral pulses, no bruits Ext-1+ edema, no chords, 2+ DP Neuro-grossly nonfocal  ECG -normal sinus rhythm at a rate of 94, left axis deviation, probable precordial lead reversal.  Personally reviewed  A/P  1 lower extremity edema-patient with 1+ edema on exam today.  Question if amlodipine could be contributing.  We will discontinue.  I will also discontinue HCTZ  and instead treat with Lasix 20 mg daily.  Check potassium and renal function in 1 week.  Schedule echocardiogram to assess LV and RV function (question if he has developed pulmonary hypertension from obesity hypoventilation syndrome or obstructive sleep apnea).  I have also asked him to fluid restrict to 1500 cc daily and continue low-sodium diet.  We discussed keeping his feet elevated.  2 hypertension-blood pressure is controlled but I am discontinuing amlodipine to see if this could be contributing to his lower extremity edema.  Instead we will add carvedilol 6.25 mg twice daily daily.  Follow blood pressure and adjust medications as needed.  3 sleep apnea-he will follow-up with primary care for this issue and may need repeat sleep study and therapy if needed.  4 history of atrial flutter ablation-patient remains in sinus rhythm.  5 morbid obesity-we discussed weight loss.  Kirk Ruths, MD

## 2021-06-04 ENCOUNTER — Telehealth: Payer: Self-pay | Admitting: Dermatology

## 2021-06-04 NOTE — Telephone Encounter (Signed)
Pathology to patient.  °

## 2021-06-04 NOTE — Telephone Encounter (Signed)
Results

## 2021-06-09 ENCOUNTER — Other Ambulatory Visit: Payer: Self-pay | Admitting: Gastroenterology

## 2021-06-13 ENCOUNTER — Other Ambulatory Visit: Payer: Self-pay

## 2021-06-13 ENCOUNTER — Ambulatory Visit (INDEPENDENT_AMBULATORY_CARE_PROVIDER_SITE_OTHER): Payer: Medicare Other | Admitting: Cardiology

## 2021-06-13 ENCOUNTER — Encounter: Payer: Self-pay | Admitting: Cardiology

## 2021-06-13 VITALS — BP 132/86 | HR 98 | Resp 20 | Ht 69.0 in | Wt 374.2 lb

## 2021-06-13 DIAGNOSIS — R609 Edema, unspecified: Secondary | ICD-10-CM

## 2021-06-13 DIAGNOSIS — G4733 Obstructive sleep apnea (adult) (pediatric): Secondary | ICD-10-CM | POA: Diagnosis not present

## 2021-06-13 DIAGNOSIS — I10 Essential (primary) hypertension: Secondary | ICD-10-CM

## 2021-06-13 DIAGNOSIS — R600 Localized edema: Secondary | ICD-10-CM

## 2021-06-13 MED ORDER — CARVEDILOL 6.25 MG PO TABS: 6.2500 mg | ORAL_TABLET | Freq: Two times a day (BID) | ORAL | 3 refills | Status: DC

## 2021-06-13 MED ORDER — FUROSEMIDE 20 MG PO TABS: 20.0000 mg | ORAL_TABLET | Freq: Every day | ORAL | 3 refills | Status: DC

## 2021-06-13 NOTE — Patient Instructions (Signed)
Medication Instructions:   STOP AMLODIPINE  STOP HCTZ  START FUROSEMIDE 20 MG ONCE DAILY  START CARVEDILOL 6.25 MG TWICE DAILY  *If you need a refill on your cardiac medications before your next appointment, please call your pharmacy*   Lab Work:  Your physician recommends that you return for lab work in: ONE WEEK-DO NOT NEED TO FAST  If you have labs (blood work) drawn today and your tests are completely normal, you will receive your results only by: [place] (if you have MyChart) OR A paper copy in the mail If you have any lab test that is abnormal or we need to change your treatment, we will call you to review the results.   Testing/Procedures:  Your physician has requested that you have an echocardiogram. Echocardiography is a painless test that uses sound waves to create images of your heart. It provides your doctor with information about the size and shape of your heart and how well your hearts chambers and valves are working. This procedure takes approximately one hour. There are no restrictions for this procedure. [place]   Follow-Up: At [facility], you and your health needs are our priority.  As part of our continuing mission to provide you with exceptional heart care, we have created designated Provider Care Teams.  These Care Teams include your primary Cardiologist (physician) and Advanced Practice Providers (APPs -  Physician Assistants and Nurse Practitioners) who all work together to provide you with the care you need, when you need it.  We recommend signing up for the patient portal called "MyChart".  Sign up information is provided on this After Visit Summary.  MyChart is used to connect with patients for Virtual Visits (Telemedicine).  Patients are able to view lab/test results, encounter notes, upcoming appointments, etc.  Non-urgent messages can be sent to your provider as well.   To learn more about what you can do with MyChart, go to  [email or web address].    Your next appointment:   6 month(s)  The format for your next appointment:   In Person  Provider:   Kirk Ruths MD

## 2021-06-15 ENCOUNTER — Encounter: Payer: Self-pay | Admitting: Dermatology

## 2021-06-15 NOTE — Progress Notes (Signed)
° °  Follow-Up Visit   Subjective  Ryan Jones is a 66 y.o. male who presents for the following: Skin Problem (PCP concerned about a spot on the back that needs to be checked).  General skin check, possible change in spot on back Location:  Duration:  Quality:  Associated Signs/Symptoms: Modifying Factors:  Severity:  Timing: Context:   Objective  Well appearing patient in no apparent distress; mood and affect are within normal limits. Waist up skin examination: No atypical pigmented lesions.  Abdomen (Lower Torso, Anterior) Multiple 1 mm smooth red dermal papules  Mid Back Hornlike 5 mm crust, irritated SK versus superficial carcinoma       Left Forearm - Anterior (2), Mid Forehead, Right Forearm - Anterior 3 mm gritty pink crusts    All skin waist up examined.   Assessment & Plan    Cherry angioma Abdomen (Lower Torso, Anterior)  No intervention necessary  Neoplasm of uncertain behavior of skin Mid Back  Skin / nail biopsy Type of biopsy: tangential   Informed consent: discussed and consent obtained   Timeout: patient name, date of birth, surgical site, and procedure verified   Anesthesia: the lesion was anesthetized in a standard fashion   Anesthetic:  1% lidocaine w/ epinephrine 1-100,000 local infiltration Instrument used: flexible razor blade   Hemostasis achieved with: ferric subsulfate and electrodesiccation   Outcome: patient tolerated procedure well   Post-procedure details: wound care instructions given    Specimen 1 - Surgical pathology Differential Diagnosis: R/O BCC VS SCC   Check Margins: No  After shave biopsy base was lightly cauterized  Actinic keratosis (4) Left Forearm - Anterior (2); Right Forearm - Anterior; Mid Forehead  Destruction of lesion - Left Forearm - Anterior (2), Mid Forehead, Right Forearm - Anterior Complexity: simple   Destruction method: cryotherapy   Informed consent: discussed and consent obtained    Timeout:  patient name, date of birth, surgical site, and procedure verified Lesion destroyed using liquid nitrogen: Yes   Cryotherapy cycles:  3 Outcome: patient tolerated procedure well with no complications   Post-procedure details: wound care instructions given    Encounter for screening for malignant neoplasm of skin  Annual skin examination      I, [name], MD, have reviewed all documentation for this visit.  The documentation on 06/15/21 for the exam, diagnosis, procedures, and orders are all accurate and complete.

## 2021-06-19 ENCOUNTER — Other Ambulatory Visit: Payer: Self-pay

## 2021-06-19 ENCOUNTER — Ambulatory Visit (INDEPENDENT_AMBULATORY_CARE_PROVIDER_SITE_OTHER): Payer: Medicare Other

## 2021-06-19 DIAGNOSIS — I10 Essential (primary) hypertension: Secondary | ICD-10-CM | POA: Diagnosis not present

## 2021-06-19 DIAGNOSIS — R600 Localized edema: Secondary | ICD-10-CM | POA: Diagnosis not present

## 2021-06-19 DIAGNOSIS — R609 Edema, unspecified: Secondary | ICD-10-CM

## 2021-06-19 LAB — ECHOCARDIOGRAM COMPLETE
AR max vel: 3.22 cm2
AV Area VTI: 3.26 cm2
AV Area mean vel: 3.26 cm2
AV Mean grad: 5 mmHg
AV Peak grad: 9.5 mmHg
Ao pk vel: 1.54 m/s
Area-P 1/2: 4.44 cm2
Calc EF: 54 %
Single Plane A2C EF: 54.5 %
Single Plane A4C EF: 55.8 %

## 2021-06-19 MED ORDER — PERFLUTREN LIPID MICROSPHERE
1.0000 mL | INTRAVENOUS | Status: AC | PRN
  Administered 2021-06-19: 2 mL via INTRAVENOUS

## 2021-07-08 ENCOUNTER — Encounter: Payer: Self-pay | Admitting: *Deleted

## 2021-07-23 IMAGING — CR DG CHEST 2V
2 series · 2 of 2 positions shown · non-contrast
Comparison: June 13, 2011.

CLINICAL DATA: Left rib pain after fall 2 days ago.

EXAM:
CHEST - 2 VIEW

[w chest pa]
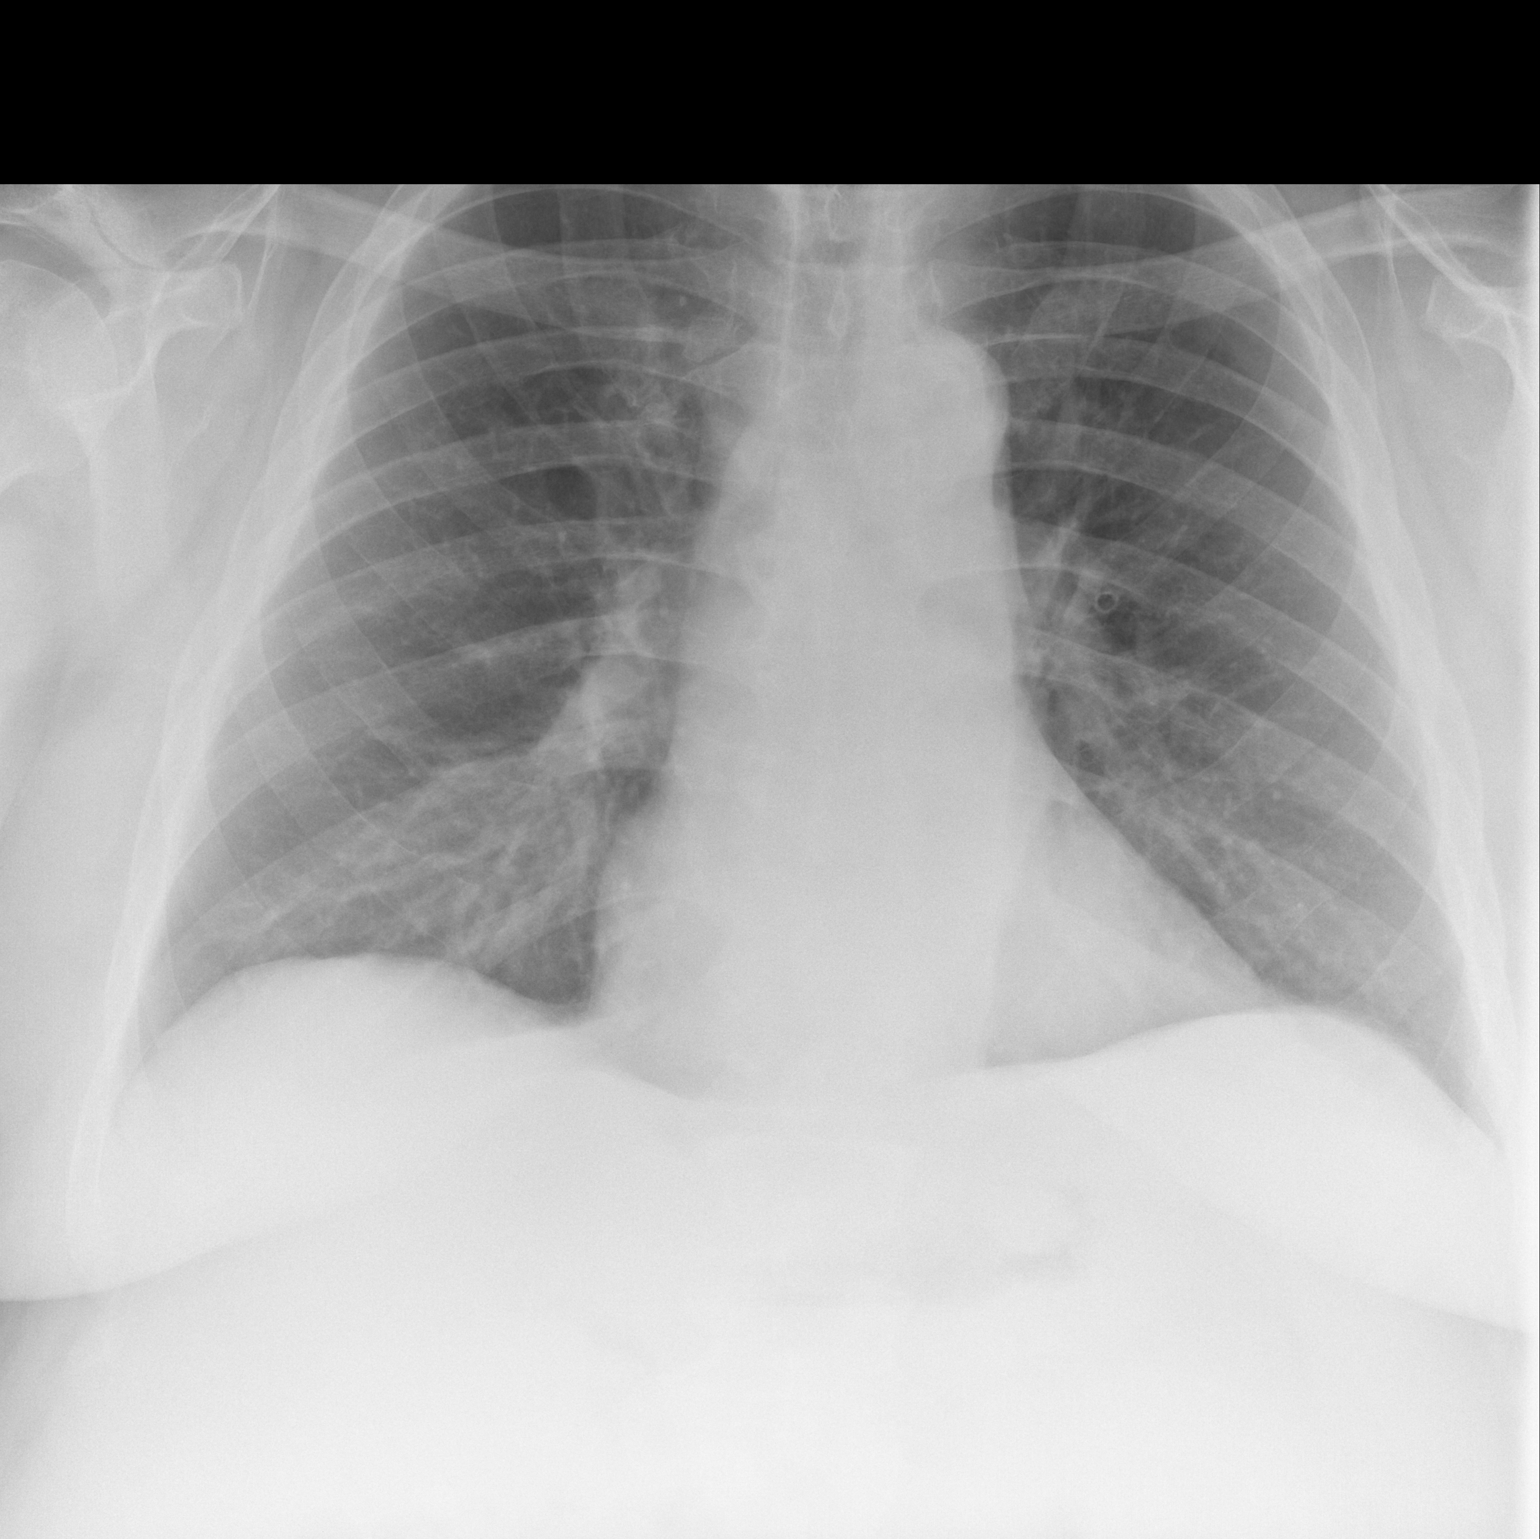

[w chest lat]
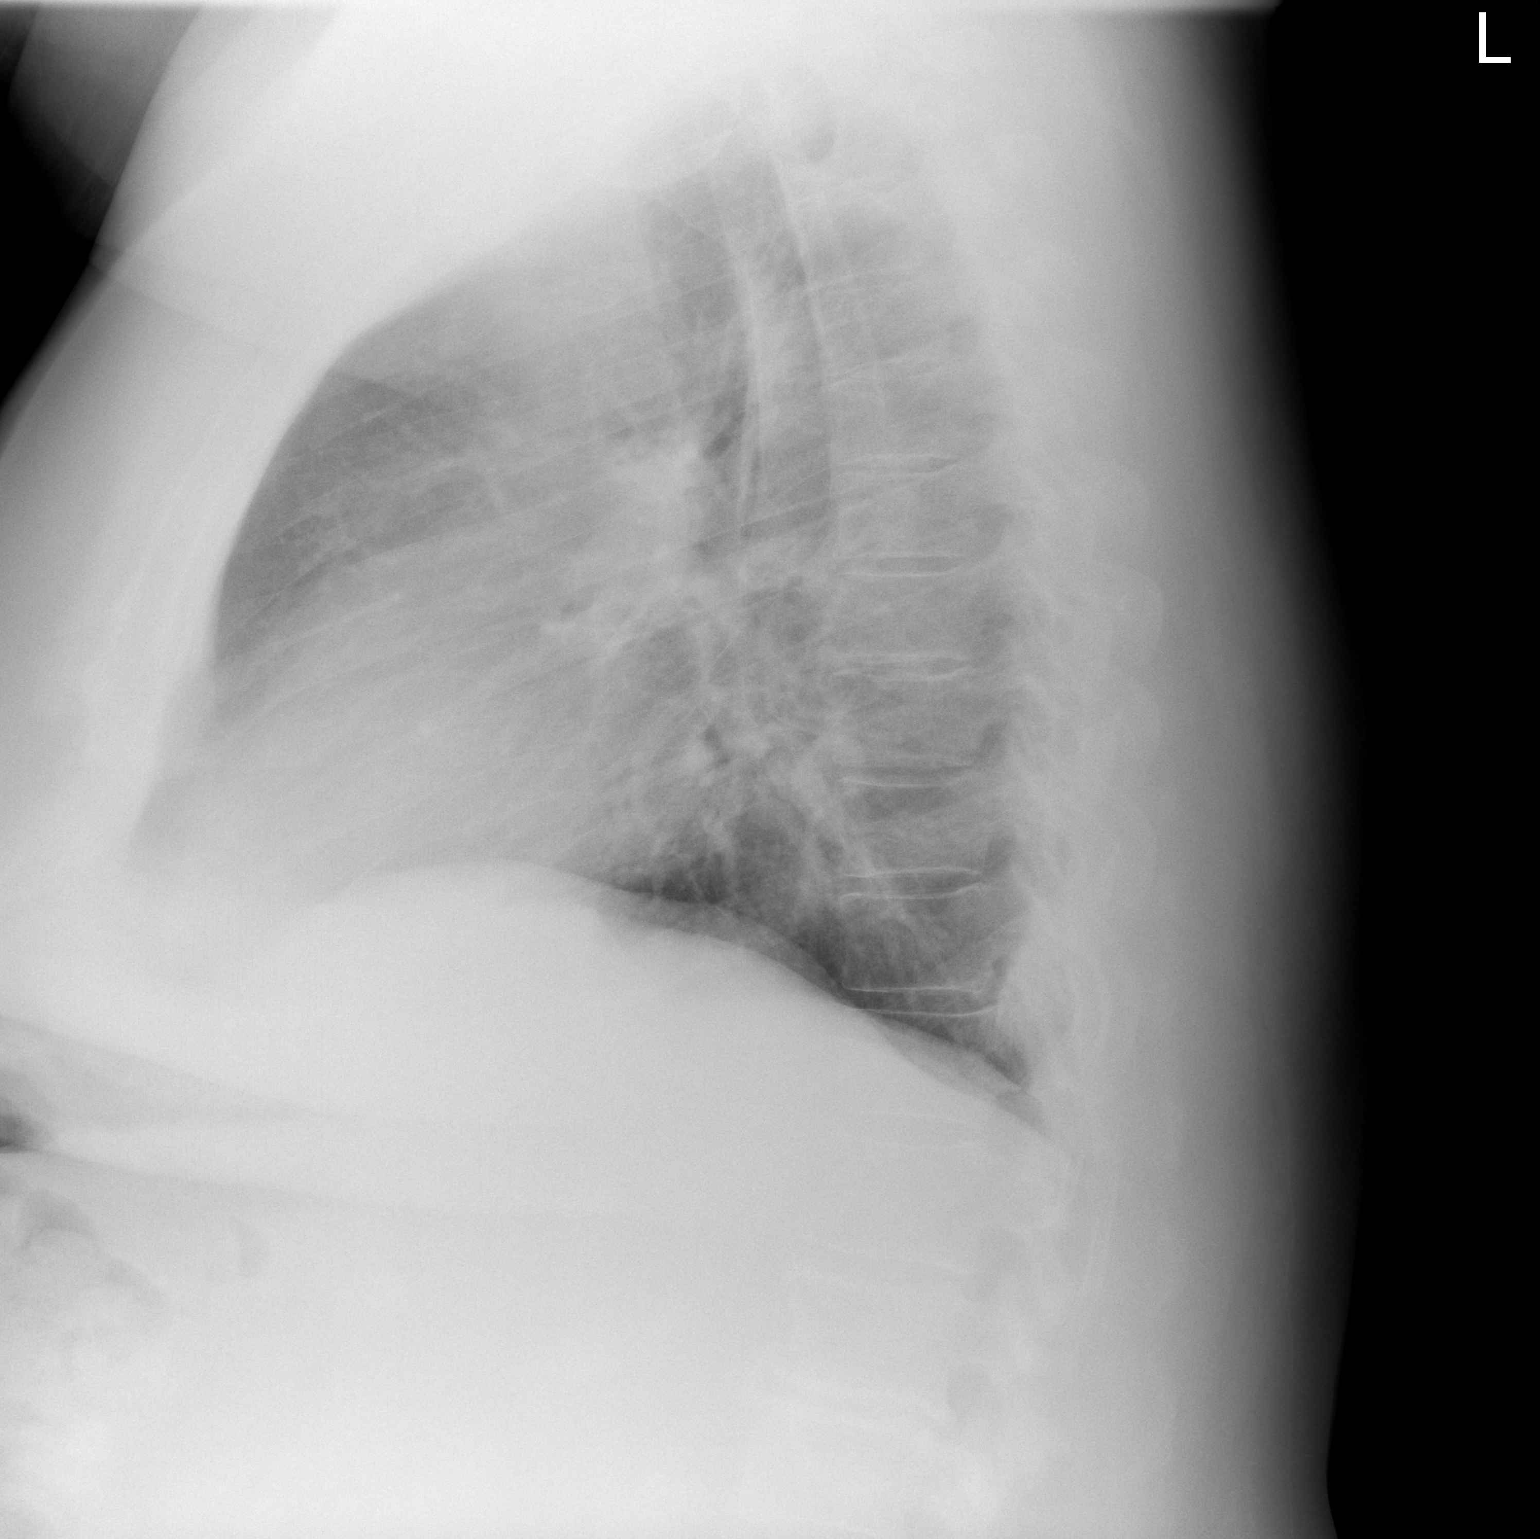

[2 of 2 positions shown; findings below may reference images not displayed]

FINDINGS: The heart size and mediastinal contours are within normal limits.
Both lungs are clear. No pneumothorax or pleural effusion is noted.
The visualized skeletal structures are unremarkable.
IMPRESSION: No active cardiopulmonary disease.

## 2021-07-30 ENCOUNTER — Other Ambulatory Visit: Payer: Self-pay | Admitting: Gastroenterology

## 2021-07-30 DIAGNOSIS — R7989 Other specified abnormal findings of blood chemistry: Secondary | ICD-10-CM

## 2021-08-04 ENCOUNTER — Ambulatory Visit
Admission: RE | Admit: 2021-08-04 | Discharge: 2021-08-04 | Disposition: A | Discharge status: Home / Self Care | Payer: Federal, State, Local not specified - PPO | Source: Ambulatory Visit | Attending: Gastroenterology | Admitting: Gastroenterology

## 2021-08-04 DIAGNOSIS — R7989 Other specified abnormal findings of blood chemistry: Secondary | ICD-10-CM

## 2021-08-19 ENCOUNTER — Encounter (HOSPITAL_COMMUNITY): Payer: Self-pay | Admitting: Gastroenterology

## 2021-08-21 ENCOUNTER — Encounter (HOSPITAL_COMMUNITY): Payer: Self-pay | Admitting: Gastroenterology

## 2021-08-21 NOTE — Anesthesia Preprocedure Evaluation (Addendum)
Anesthesia Evaluation  ?Patient identified by MRN, date of birth, ID band ?Patient awake ? ? ? ?Reviewed: ?Allergy & Precautions, NPO status , Patient's Chart, lab work & pertinent test results ? ?History of Anesthesia Complications ?Negative for: history of anesthetic complications ? ?Airway ?Mallampati: II ? ?TM Distance: >3 FB ?Neck ROM: Full ? ? ? Dental ? ?(+) Missing, Dental Advisory Given, Poor Dentition ?  ?Pulmonary ?sleep apnea , COPD (last in haler use months ago),  COPD inhaler,  ?  ?breath sounds clear to auscultation ? ? ? ? ? ? Cardiovascular ?hypertension, Pt. on medications ?(-) angina+ dysrhythmias (s/p ablation) Atrial Fibrillation  ?Rhythm:Regular Rate:Normal ? ?06/2021 ECHO: EF 55-60%, normal LVF, mild LVH, Grade 1 DD, normal RVF, aortic valve sclerosis without stenosis, trivial MR ?  ?Neuro/Psych ? Headaches,   ? GI/Hepatic ?Neg liver ROS, GERD  Controlled and Medicated,  ?Endo/Other  ?diabetes (glu 88), Oral Hypoglycemic AgentsMorbid obesity ? Renal/GU ?Renal InsufficiencyRenal disease  ? ?  ?Musculoskeletal ? ?(+) Arthritis ,  ? Abdominal ?(+) + obese,   ?Peds ? Hematology ?negative hematology ROS ?(+)   ?Anesthesia Other Findings ? ? Reproductive/Obstetrics ? ?  ? ? ? ? ? ? ? ? ? ? ? ? ? ?  ?  ? ? ? ? ? ? ? ?Anesthesia Physical ?Anesthesia Plan ? ?ASA: 3 ? ?Anesthesia Plan: MAC  ? ?Post-op Pain Management: Minimal or no pain anticipated  ? ?Induction:  ? ?PONV Risk Score and Plan: 1 and Treatment may vary due to age or medical condition ? ?Airway Management Planned: Simple Face Mask and Natural Airway ? ?Additional Equipment: None ? ?Intra-op Plan:  ? ?Post-operative Plan:  ? ?Informed Consent: I have reviewed the patients History and Physical, chart, labs and discussed the procedure including the risks, benefits and alternatives for the proposed anesthesia with the patient or authorized representative who has indicated his/her understanding and acceptance.   ? ? ? ?Dental advisory given ? ?Plan Discussed with: CRNA and Surgeon ? ?Anesthesia Plan Comments:   ? ? ? ? ? ?Anesthesia Quick Evaluation ? ?

## 2021-08-22 ENCOUNTER — Ambulatory Visit (HOSPITAL_COMMUNITY)
Admission: RE | Admit: 2021-08-22 | Discharge: 2021-08-22 | Disposition: A | Discharge status: Home / Self Care | Payer: Medicare Other | Source: Ambulatory Visit | Attending: Gastroenterology | Admitting: Gastroenterology

## 2021-08-22 ENCOUNTER — Ambulatory Visit (HOSPITAL_BASED_OUTPATIENT_CLINIC_OR_DEPARTMENT_OTHER): Payer: Medicare Other | Admitting: Anesthesiology

## 2021-08-22 ENCOUNTER — Encounter (HOSPITAL_COMMUNITY): Payer: Self-pay | Admitting: Gastroenterology

## 2021-08-22 ENCOUNTER — Encounter (HOSPITAL_COMMUNITY)
Admission: RE | Disposition: A | Discharge status: Home / Self Care | Payer: Self-pay | Source: Ambulatory Visit | Attending: Gastroenterology

## 2021-08-22 ENCOUNTER — Ambulatory Visit (HOSPITAL_COMMUNITY): Payer: Medicare Other | Admitting: Anesthesiology

## 2021-08-22 ENCOUNTER — Other Ambulatory Visit: Payer: Self-pay

## 2021-08-22 DIAGNOSIS — M1990 Unspecified osteoarthritis, unspecified site: Secondary | ICD-10-CM | POA: Insufficient documentation

## 2021-08-22 DIAGNOSIS — J449 Chronic obstructive pulmonary disease, unspecified: Secondary | ICD-10-CM | POA: Insufficient documentation

## 2021-08-22 DIAGNOSIS — K635 Polyp of colon: Secondary | ICD-10-CM | POA: Insufficient documentation

## 2021-08-22 DIAGNOSIS — Z86010 Personal history of colonic polyps: Secondary | ICD-10-CM | POA: Insufficient documentation

## 2021-08-22 DIAGNOSIS — K5520 Angiodysplasia of colon without hemorrhage: Secondary | ICD-10-CM | POA: Diagnosis not present

## 2021-08-22 DIAGNOSIS — R945 Abnormal results of liver function studies: Secondary | ICD-10-CM | POA: Insufficient documentation

## 2021-08-22 DIAGNOSIS — E119 Type 2 diabetes mellitus without complications: Secondary | ICD-10-CM | POA: Diagnosis not present

## 2021-08-22 DIAGNOSIS — K3189 Other diseases of stomach and duodenum: Secondary | ICD-10-CM

## 2021-08-22 DIAGNOSIS — G4730 Sleep apnea, unspecified: Secondary | ICD-10-CM | POA: Diagnosis not present

## 2021-08-22 DIAGNOSIS — N289 Disorder of kidney and ureter, unspecified: Secondary | ICD-10-CM | POA: Insufficient documentation

## 2021-08-22 DIAGNOSIS — I8500 Esophageal varices without bleeding: Secondary | ICD-10-CM

## 2021-08-22 DIAGNOSIS — E669 Obesity, unspecified: Secondary | ICD-10-CM | POA: Diagnosis not present

## 2021-08-22 DIAGNOSIS — D696 Thrombocytopenia, unspecified: Secondary | ICD-10-CM | POA: Diagnosis not present

## 2021-08-22 DIAGNOSIS — D123 Benign neoplasm of transverse colon: Secondary | ICD-10-CM | POA: Diagnosis not present

## 2021-08-22 DIAGNOSIS — K766 Portal hypertension: Secondary | ICD-10-CM

## 2021-08-22 DIAGNOSIS — Z1211 Encounter for screening for malignant neoplasm of colon: Secondary | ICD-10-CM | POA: Diagnosis present

## 2021-08-22 DIAGNOSIS — Z6841 Body Mass Index (BMI) 40.0 and over, adult: Secondary | ICD-10-CM | POA: Insufficient documentation

## 2021-08-22 DIAGNOSIS — I4891 Unspecified atrial fibrillation: Secondary | ICD-10-CM | POA: Insufficient documentation

## 2021-08-22 DIAGNOSIS — I10 Essential (primary) hypertension: Secondary | ICD-10-CM | POA: Insufficient documentation

## 2021-08-22 HISTORY — PX: ESOPHAGOGASTRODUODENOSCOPY (EGD) WITH PROPOFOL: SHX5813

## 2021-08-22 HISTORY — PX: POLYPECTOMY: SHX5525

## 2021-08-22 HISTORY — PX: COLONOSCOPY WITH PROPOFOL: SHX5780

## 2021-08-22 LAB — GLUCOSE, CAPILLARY
Glucose-Capillary: 88 mg/dL (ref 70–99)
Glucose-Capillary: 92 mg/dL (ref 70–99)

## 2021-08-22 SURGERY — COLONOSCOPY WITH PROPOFOL
Anesthesia: Monitor Anesthesia Care

## 2021-08-22 SURGERY — ESOPHAGOGASTRODUODENOSCOPY (EGD) WITH PROPOFOL
Anesthesia: Monitor Anesthesia Care

## 2021-08-22 MED ORDER — PROPOFOL 500 MG/50ML IV EMUL
INTRAVENOUS | Status: AC
  Filled 2021-08-22: qty 50

## 2021-08-22 MED ORDER — PROPOFOL 500 MG/50ML IV EMUL
INTRAVENOUS | Status: DC | PRN
  Administered 2021-08-22: 20 mg via INTRAVENOUS
  Administered 2021-08-22 (×2): 30 mg via INTRAVENOUS
  Administered 2021-08-22: 40 mg via INTRAVENOUS

## 2021-08-22 MED ORDER — LACTATED RINGERS IV SOLN
INTRAVENOUS | Status: DC | PRN
Start: 2021-08-22 — End: 2021-08-22

## 2021-08-22 MED ORDER — PROPOFOL 500 MG/50ML IV EMUL
INTRAVENOUS | Status: DC | PRN
Start: 2021-08-22 — End: 2021-08-22
  Administered 2021-08-22: 75 ug/kg/min via INTRAVENOUS

## 2021-08-22 MED ORDER — LIDOCAINE HCL (CARDIAC) PF 100 MG/5ML IV SOSY
PREFILLED_SYRINGE | INTRAVENOUS | Status: DC | PRN
  Administered 2021-08-22: 60 mg via INTRAVENOUS

## 2021-08-22 MED ORDER — SODIUM CHLORIDE 0.9 % IV SOLN: INTRAVENOUS | Status: DC

## 2021-08-22 SURGICAL SUPPLY — 22 items

## 2021-08-22 NOTE — H&P (Signed)
Ryan Jones ?HPI: The patient was recently noted to have a worsening thrombocytopenia.  He was documented, in Epic, with abnormal liver enzymes.  The patient carries a diagnosis of DM, HTN, and hyperlipidemia.  His FIB-4 is at 3.38 using values from 05/16/2021. ? ?Past Medical History:  ?Diagnosis Date  ? Arthritis   ? Atrial flutter ([place]) 02/26/2011  ? s/p RFCA 02/2011  ? Bronchitis   ? h/o recurrent bronchitis; "usually get it 1-2X/wy; last time 12/2010"  ? Cancer [facility]) 2011  ? right ear,basal  ? Constipation   ? Diabetes mellitus   ? "pills"  ? Diarrhea   ? GERD (gastroesophageal reflux disease)   ? Gout   ? "get it in my hands and feet"  ? Headache   ? sinus headache  ? Hemorrhoids   ? Hyperlipidemia   ? Hypertension   ? Kidney stone   ? 1999  ? Seasonal allergies 02/26/2011  ? "I take an allergy shot q week"  ? Sleep apnea   ? no cpap used much since nasal surgery yrs ago  ? ? ?Past Surgical History:  ?Procedure Laterality Date  ? ATRIAL FLUTTER ABLATION N/A 02/26/2011  ? Procedure: ATRIAL FLUTTER ABLATION;  Surgeon: Deboraha Sprang, MD;  Location: [facility] CATH LAB;  Service: Cardiovascular;  Laterality: N/A;  ? BACK SURGERY  before 1995 and in 1995;   ? "ruptured disc repaired; lower back"  ? CARDIAC ELECTROPHYSIOLOGY STUDY AND ABLATION  02/26/2011  ? for atrial fib  ? COLONOSCOPY WITH PROPOFOL N/A 01/18/2015  ? Procedure: COLONOSCOPY WITH PROPOFOL;  Surgeon: Carol Ada, MD;  Location: [facility] ENDOSCOPY;  Service: Endoscopy;  Laterality: N/A;  ? COLONOSCOPY WITH PROPOFOL N/A 02/04/2018  ? Procedure: COLONOSCOPY WITH PROPOFOL;  Surgeon: Carol Ada, MD;  Location: [facility] ENDOSCOPY;  Service: Endoscopy;  Laterality: N/A;  ? LUMBAR DISC SURGERY  ~ 1993  ? "put foam in back; in place of disc"  ? LUMBAR [place] SURGERY  04/1993  ? "replaced gel foam that had blown out"  ? nasal septum surgery to open up sinuses  yrs ago  ? dr Simeon Craft  ? POLYPECTOMY  02/04/2018  ? Procedure: POLYPECTOMY;  Surgeon: Carol Ada, MD;  Location: [facility]  ENDOSCOPY;  Service: Endoscopy;;  ? SKIN CANCER EXCISION  2011  ? right ear  ? squamous cell area removed from right arm  6 weeks ago  ? ? ?Family History  ?Problem Relation Age of Onset  ? Heart attack Father   ? ? ?Social History:  reports that he has never smoked. His smokeless tobacco use includes snuff. He reports that he does not drink alcohol and does not use drugs. ? ?Allergies:  ?Allergies  ?Allergen Reactions  ? Augmentin [Amoxicillin-Pot Clavulanate] Other (See Comments)  ?  Thrush and yeast infection ?Has patient had a PCN reaction causing immediate rash, facial/tongue/throat swelling, SOB or lightheadedness with hypotension: No ?Has patient had a PCN reaction causing severe rash involving mucus membranes or skin necrosis: No ?Has patient had a PCN reaction that required hospitalization: No ?Has patient had a PCN reaction occurring within the last 10 years: No ?If all of the above answers are "NO", then may proceed with Cephalosporin use. ? ?  ? Codeine Itching  ? ? ?Medications: Scheduled: ?Continuous: ? ?No results found for this or any previous visit (from the past 24 hour(s)).  ? ?No results found. ? ?ROS:  As stated above in the HPI otherwise negative. ? ?There were no vitals taken for  this visit.   ? ?PE: ?Gen: NAD, Alert and Oriented ?HEENT:  [place]/AT, EOMI ?Neck: Supple, no LAD ?Lungs: CTA Bilaterally ?CV: RRR without M/G/R ?ABD: Soft, NTND, +BS ?Ext: No C/C/E ? ?Assessment/Plan: ?1) Probable Cirrhosis. ?2) Personal history of polyps. ? ?Plan: ?1) EGD/colonoscopy. ? ?Davis Ambrosini D ?08/22/2021, 7:13 AM  ? ? ?  ? ?

## 2021-08-22 NOTE — Op Note (Signed)
[facility] ?Patient Name: Ryan Jones ?Procedure Date: 08/22/2021 ?MRN: [id number] ?Attending MD: Carol Ada , MD ?Date of Birth: 07-05-1955 ?CSN: [id number] ?Age: 66 ?Admit Type: Outpatient ?Procedure:                Colonoscopy ?Indications:              High risk colon cancer surveillance: Personal  ?                          history of colonic polyps ?Providers:                Carol Ada, MD, Allayne Gitelman, RN, Benetta Spar,  ?                          Technician ?Referring MD:              ?Medicines:                Propofol per Anesthesia ?Complications:            No immediate complications. ?Estimated Blood Loss:     Estimated blood loss: none. ?Procedure:                Pre-Anesthesia Assessment: ?                          - Prior to the procedure, a History and Physical  ?                          was performed, and patient medications and  ?                          allergies were reviewed. The patient's tolerance of  ?                          previous anesthesia was also reviewed. The risks  ?                          and benefits of the procedure and the sedation  ?                          options and risks were discussed with the patient.  ?                          All questions were answered, and informed consent  ?                          was obtained. Prior Anticoagulants: The patient has  ?                          taken no previous anticoagulant or antiplatelet  ?                          agents. ASA Grade Assessment: III - A patient with  ?                          severe systemic  disease. After reviewing the risks  ?                          and benefits, the patient was deemed in  ?                          satisfactory condition to undergo the procedure. ?                          - Sedation was administered by an anesthesia  ?                          professional. Deep sedation was attained. ?                          After obtaining informed consent, the colonoscope   ?                          was passed under direct vision. Throughout the  ?                          procedure, the patient's blood pressure, pulse, and  ?                          oxygen saturations were monitored continuously. The  ?                          PCF-HQ190L ([id number]) Olympus colonoscope was  ?                          introduced through the anus and advanced to the the  ?                          cecum, identified by appendiceal orifice and  ?                          ileocecal valve. The colonoscopy was performed  ?                          without difficulty. The patient tolerated the  ?                          procedure well. The quality of the bowel  ?                          preparation was evaluated using the BBPS [facility]  ?                          Bowel Preparation Scale) with scores of: Right  ?                          Colon = 3 (entire mucosa seen well with no residual  ?  staining, small fragments of stool or opaque  ?                          liquid), Transverse Colon = 3 (entire mucosa seen  ?                          well with no residual staining, small fragments of  ?                          stool or opaque liquid) and Left Colon = 3 (entire  ?                          mucosa seen well with no residual staining, small  ?                          fragments of stool or opaque liquid). The total  ?                          BBPS score equals 9. The quality of the bowel  ?                          preparation was good. The ileocecal valve,  ?                          appendiceal orifice, and rectum were photographed. ?Scope In: 8:18:52 AM ?Scope Out: 8:39:40 AM ?Scope Withdrawal Time: 0 hours 16 minutes 24 seconds  ?Total Procedure Duration: 0 hours 20 minutes 48 seconds  ?Findings: ?     Three sessile polyps were found in the transverse colon and ascending  ?     colon. The polyps were 3 to 4 mm in size. These polyps were removed with  ?     a cold snare.  Resection and retrieval were complete. ?     Four medium-sized patchy angiodysplastic lesions without bleeding were  ?     found in the ascending colon and in the cecum. ?Impression:               - Three 3 to 4 mm polyps in the transverse colon  ?                          and in the ascending colon, removed with a cold  ?                          snare. Resected and retrieved. ?                          - Four non-bleeding colonic angiodysplastic lesions. ?Moderate Sedation: ?     Not Applicable - Patient had care per Anesthesia. ?Recommendation:           - Patient has a contact number available for  ?                          emergencies. The signs and symptoms of potential  ?  delayed complications were discussed with the  ?                          patient. Return to normal activities tomorrow.  ?                          Written discharge instructions were provided to the  ?                          patient. ?                          - Resume previous diet. ?                          - Continue present medications. ?                          - Await pathology results. ?                          - Repeat colonoscopy in 5 years for surveillance. ?Procedure Code(s):        --- Professional --- ?                          [phone], Colonoscopy, flexible; with removal of  ?                          tumor(s), polyp(s), or other lesion(s) by snare  ?                          technique ?Diagnosis Code(s):        --- Professional --- ?                          K63.5, Polyp of colon ?                          Z86.010, Personal history of colonic polyps ?                          K55.20, Angiodysplasia of colon without hemorrhage ?CPT copyright 2019 [employer]. All rights reserved. ?The codes documented in this report are preliminary and upon coder review may  ?be revised to meet current compliance requirements. ?Carol Ada, MD ?Carol Ada, MD ?08/22/2021 8:55:35 AM ?This report  has been signed electronically. ?Number of Addenda: 0 ?

## 2021-08-22 NOTE — Anesthesia Procedure Notes (Signed)
Procedure Name: [place] ?Date/Time: 08/22/2021 8:16 AM ?Performed by: Lavina Hamman, CRNA ?Pre-anesthesia Checklist: Patient identified, Emergency Drugs available, Suction available and Patient being monitored ?Patient Re-evaluated:Patient Re-evaluated prior to induction ?Oxygen Delivery Method: Simple face mask ?Preoxygenation: Pre-oxygenation with 100% oxygen ?Induction Type: IV induction ?Placement Confirmation: positive ETCO2 and breath sounds checked- equal and bilateral ?Dental Injury: Teeth and Oropharynx as per pre-operative assessment  ? ? ? ? ?

## 2021-08-22 NOTE — Discharge Instructions (Signed)

## 2021-08-22 NOTE — Anesthesia Postprocedure Evaluation (Signed)
Anesthesia Post Note ? ?Patient: Ryan Jones ? ?Procedure(s) Performed: COLONOSCOPY WITH PROPOFOL ?ESOPHAGOGASTRODUODENOSCOPY (EGD) WITH PROPOFOL ?POLYPECTOMY ? ?  ? ?Patient location during evaluation: Endoscopy ?Anesthesia Type: MAC ?Level of consciousness: awake and alert, patient cooperative and oriented ?Pain management: pain level controlled ?Vital Signs Assessment: post-procedure vital signs reviewed and stable ?Respiratory status: spontaneous breathing, nonlabored ventilation and respiratory function stable ?Cardiovascular status: blood pressure returned to baseline and stable ?Postop Assessment: no apparent nausea or vomiting and adequate PO intake ?Anesthetic complications: no ? ? ?No notable events documented. ? ?Last Vitals:  ?Vitals:  ? 08/22/21 0848 08/22/21 0900  ?BP: 129/78 (!) 150/93  ?Pulse: 61 77  ?Resp: 20 10  ?Temp: 36.7 ?C   ?SpO2: 97% 96%  ?  ?Last Pain:  ?Vitals:  ? 08/22/21 0900  ?TempSrc:   ?PainSc: 0-No pain  ? ? ?  ?  ?  ?  ?  ?  ? ?Rowen Wilmer,E. Valera Vallas ? ? ? ? ?

## 2021-08-22 NOTE — Transfer of Care (Signed)
Immediate Anesthesia Transfer of Care Note ? ?Patient: Ryan Jones ? ?Procedure(s) Performed: Procedure(s): ?COLONOSCOPY WITH PROPOFOL (N/A) ?ESOPHAGOGASTRODUODENOSCOPY (EGD) WITH PROPOFOL (N/A) ?POLYPECTOMY ? ?Patient Location: PACU ? ?Anesthesia Type:MAC ? ?Level of Consciousness:  sedated, patient cooperative and responds to stimulation ? ?Airway & Oxygen Therapy:Patient Spontanous Breathing and Patient connected to face mask oxgen ? ?Post-op Assessment:  Report given to PACU RN and Post -op Vital signs reviewed and stable ? ?Post vital signs:  Reviewed and stable ? ?Last Vitals:  ?Vitals:  ? 08/22/21 0735 08/22/21 0848  ?BP: (!) 149/69 129/78  ?Pulse: 71 61  ?Resp: 11 20  ?Temp: 36.7 ?C 36.7 ?C  ?SpO2: 97% 97%  ? ? ?Complications: No apparent anesthesia complications ? ?

## 2021-08-25 LAB — SURGICAL PATHOLOGY

## 2022-05-04 IMAGING — US US CAROTID DUPLEX BILAT
1 series · 13 of 24 positions shown · non-contrast
Comparison: None.

CLINICAL DATA: Bilateral carotid bruit

EXAM:
BILATERAL CAROTID DUPLEX ULTRASOUND
TECHNIQUE: Gray scale imaging, color Doppler and duplex ultrasound were
performed of bilateral carotid and vertebral arteries in the neck.

[Series 1: us carotid duplex bilat · 0.06mm/px · 13 of 72 slices shown]
[im 1/72]
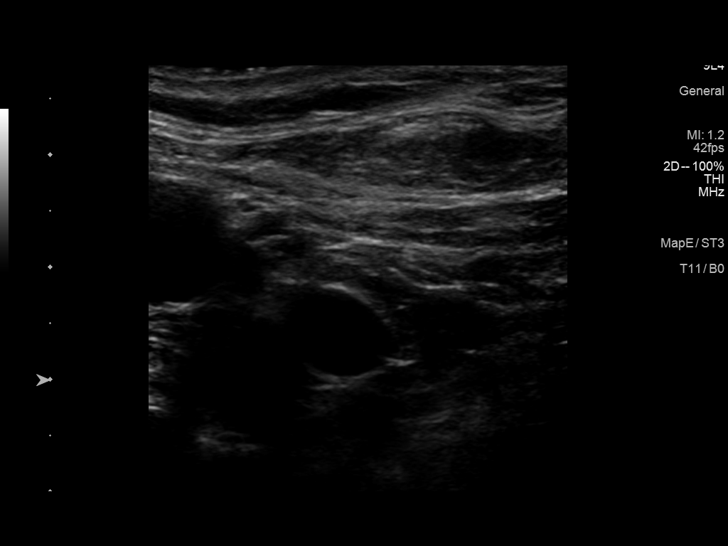
[im 7/72]
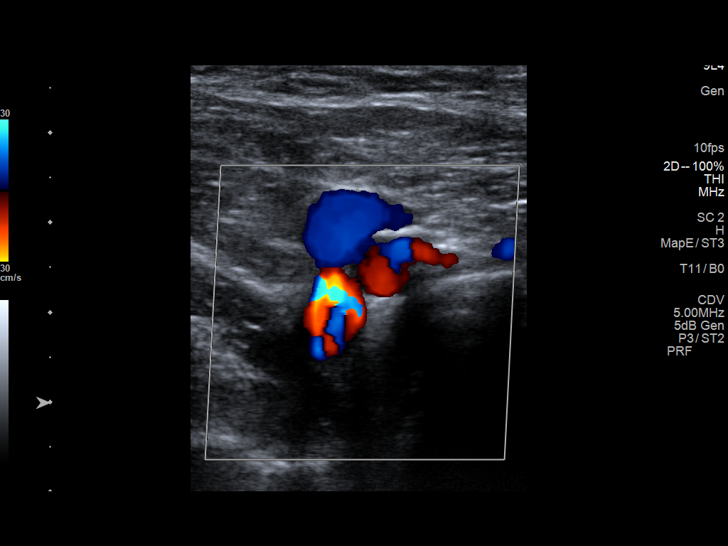
[im 13/72]
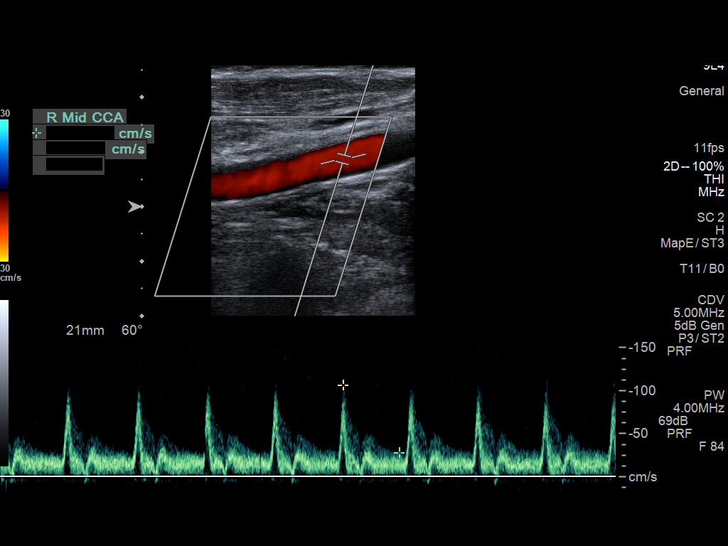
[im 19/72]
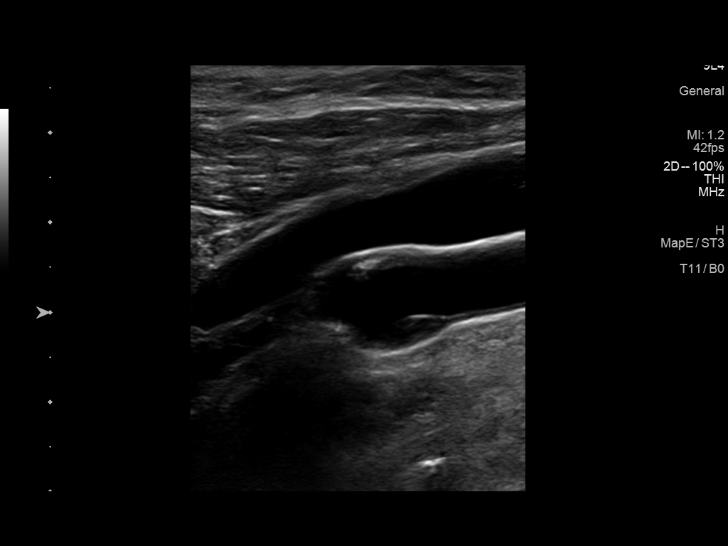
[im 25/72]
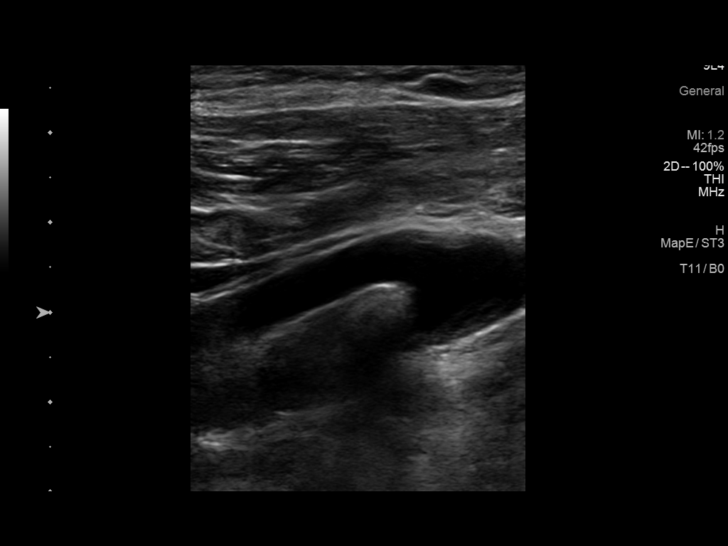
[im 31/72]
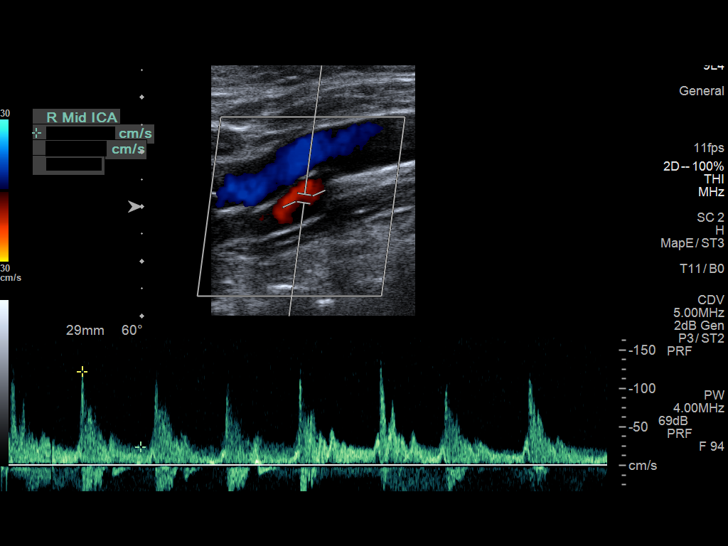
[im 38/72]
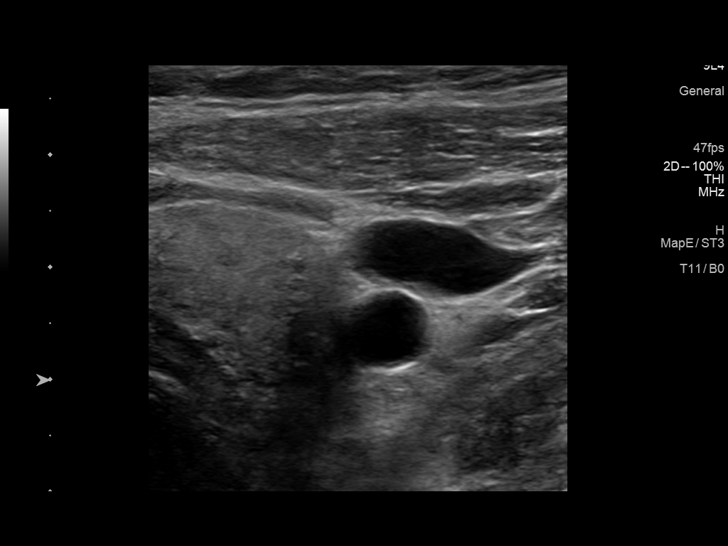
[im 41/72]
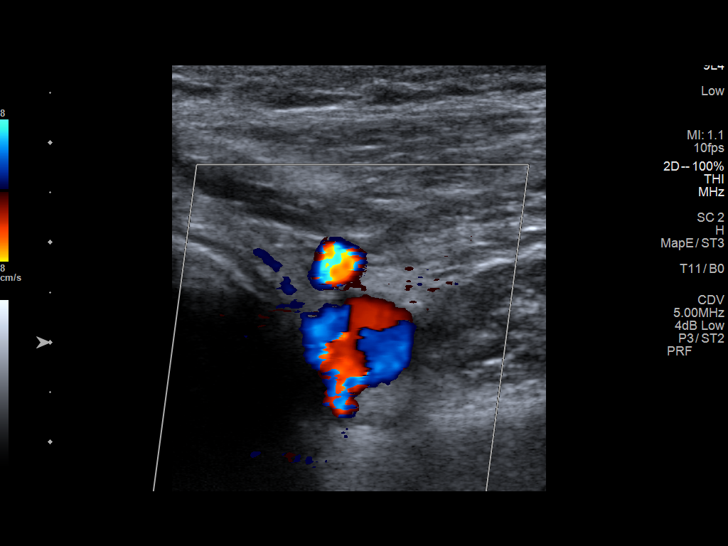
[im 47/72]
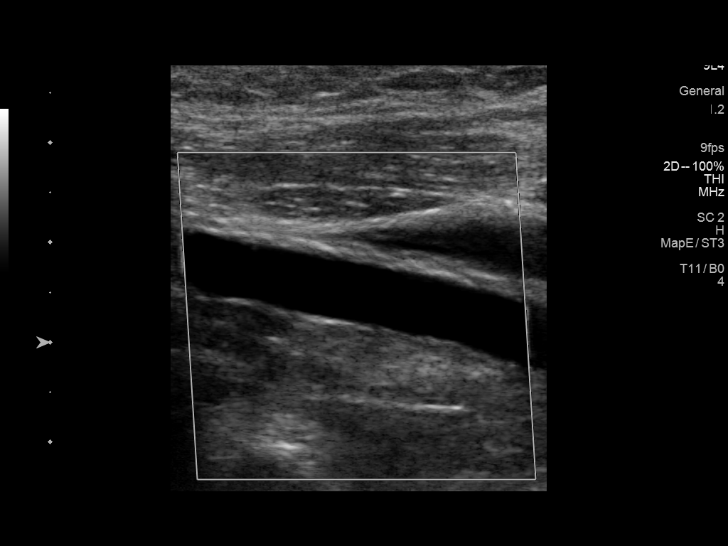
[im 53/72]
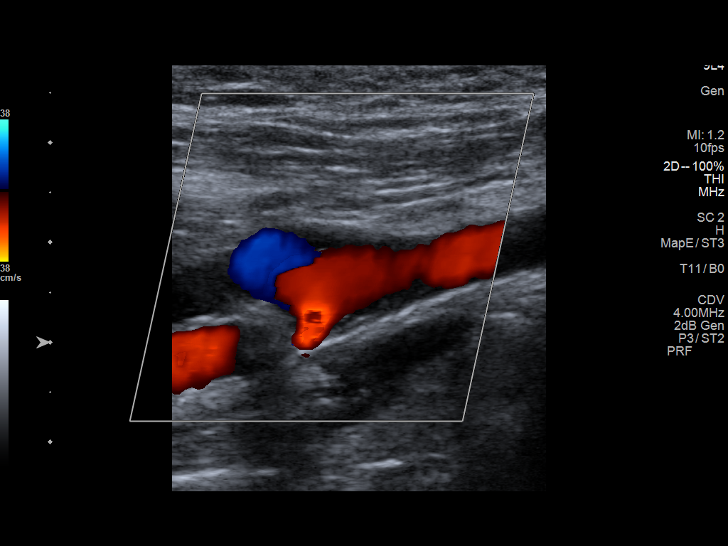
[im 59/72]
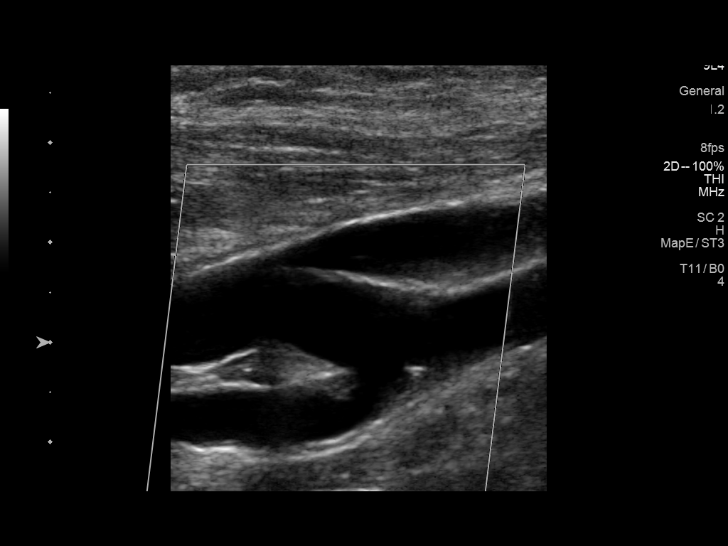
[im 65/72]
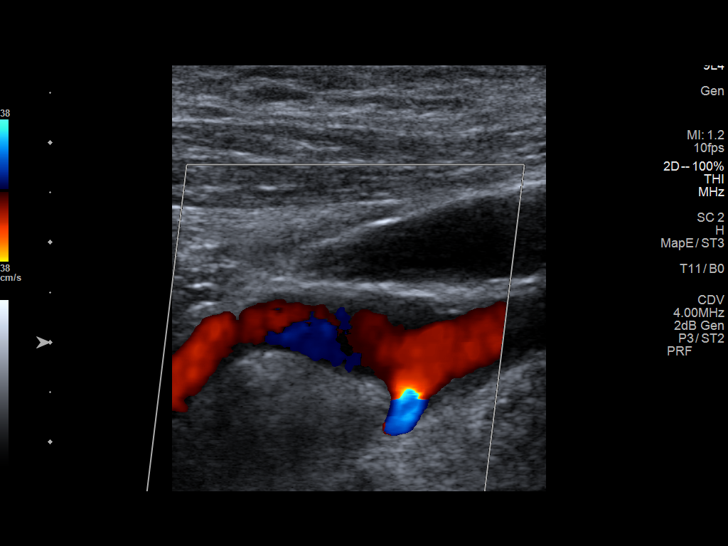
[im 72/72]
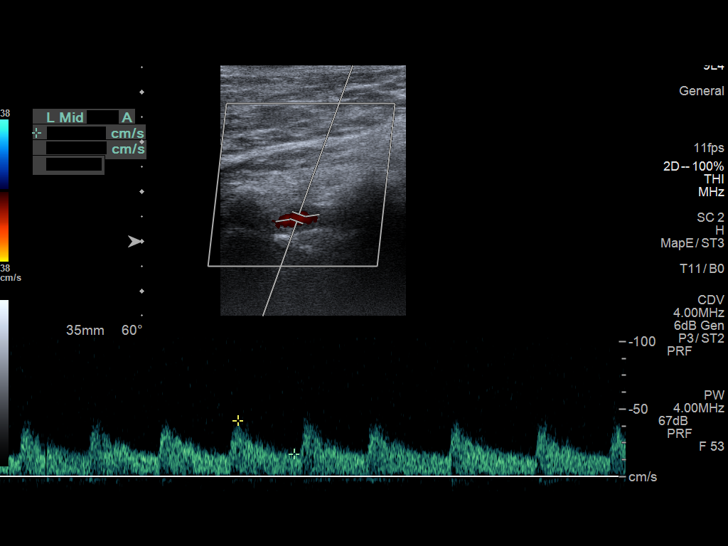

[13 of 24 positions shown; findings below may reference images not displayed]

FINDINGS: Criteria: Quantification of carotid stenosis is based on velocity
parameters that correlate the residual internal carotid diameter
with NASCET-based stenosis levels, using the diameter of the distal
internal carotid lumen as the denominator for stenosis measurement.

The following velocity measurements were obtained:

RIGHT
ICA: 122/24 cm/sec
CCA: 115/24 cm/sec

SYSTOLIC ICA/CCA RATIO:

ECA:  122 cm/sec

LEFT

ICA: 86/27 cm/sec

CCA: 103/20 cm/sec

SYSTOLIC ICA/CCA RATIO:

ECA:  340 cm/sec

RIGHT CAROTID ARTERY: Mild smooth heterogeneous atherosclerotic
plaque in the proximal internal carotid artery. By peak systolic
velocity criteria, the estimated stenosis is less than 50%.

RIGHT VERTEBRAL ARTERY:  Patent with antegrade flow.

LEFT CAROTID ARTERY: Focal heterogeneous atherosclerotic plaque at
the origin of the external carotid artery results in significant
(greater than 60%) stenosis. Mild smooth heterogeneous
atherosclerotic plaque in the proximal internal carotid artery. By
peak systolic velocity criteria, the estimated stenosis is less than
50%.

LEFT VERTEBRAL ARTERY:  Patent with normal antegrade flow.
IMPRESSION: 1. High-grade stenosis at the origin of the left external carotid
artery likely accounts for the clinical finding of bruit.
2. Mild (1-49%) stenosis proximal right internal carotid artery
secondary to mild heterogeneous atherosclerotic plaque.
3. Mild (1-49%) stenosis proximal left internal carotid artery
secondary to mild smooth heterogeneous atherosclerotic plaque.
4. The vertebral arteries are patent with normal antegrade flow.

## 2022-09-08 NOTE — Progress Notes (Signed)
[facility] - Clinic Note  09/16/2022   CHIEF COMPLAINT Patient presents for Retina Follow Up  HISTORY OF PRESENT ILLNESS: Ryan Jones is a 67 y.o. male who presents to the clinic today for:  HPI     Retina Follow Up   Patient presents with  Diabetic Retinopathy.  In both eyes.  This started 1 week ago.  Duration of 1 week.  Since onset it is stable.  I, the attending physician,  performed the HPI with the patient and updated documentation appropriately.        Comments   Retina eval per Dr Dione Booze DME OU pt is reporting no vision changes noticed he has dome floaters but denies any flashes pt last reading 131 last A1C 6.3      Last edited by Rennis Chris, MD on 09/16/2022 11:59 AM.    Patient states that he has been a diabetic for 10 yrs now.  Follows w/ endocrinology.  Has had issues with Ozempic, may be transitioning to [facility]  Referring physician: Olivia Canter, MD [address] [place],  [place] [address]  HISTORICAL INFORMATION:  Selected notes from the MEDICAL RECORD NUMBER Referred by Dr. [name] for DME OU LEE:  Ocular Hx- PMH-   CURRENT MEDICATIONS: No current outpatient medications on file. (Ophthalmic Drugs)   No current facility-administered medications for this visit. (Ophthalmic Drugs)   Current Outpatient Medications (Other)  Medication Sig   albuterol (PROVENTIL HFA;VENTOLIN HFA) 108 (90 Base) MCG/ACT inhaler Inhale 2 puffs into the lungs every 6 (six) hours as needed. For shortness of breath.   amLODipine (NORVASC) 10 MG tablet Take 10 mg by mouth in the morning.   carvedilol (COREG) 6.25 MG tablet Take 1 tablet (6.25 mg total) by mouth 2 (two) times daily.   EPINEPHrine 0.3 mg/0.3 mL IJ SOAJ injection Inject 0.3 mg into the muscle as needed for anaphylaxis.   glipiZIDE (GLUCOTROL) 5 MG tablet Take 5 mg by mouth in the morning.   hydrochlorothiazide (HYDRODIURIL) 25 MG tablet Take 25 mg by mouth in the morning.    HYDROcodone-acetaminophen (NORCO/VICODIN) 5-325 MG tablet Take 1 tablet by mouth every 4 (four) hours as needed.   ibuprofen (ADVIL,MOTRIN) 800 MG tablet Take 800 mg by mouth every 8 (eight) hours as needed (for pain.).    indomethacin (INDOCIN) 50 MG capsule TAKE 1 CAPSULE BY MOUTH THREE TIMES DAILY AS NEEDED FOR MODERATE PAIN (GOUT)   losartan (COZAAR) 100 MG tablet TAKE 1 TABLET BY MOUTH EVERY DAY   metFORMIN (GLUCOPHAGE) 1000 MG tablet Take 1 tablet (1,000 mg total) by mouth 2 (two) times daily with a meal.   mometasone (NASONEX) 50 MCG/ACT nasal spray 2 sprays each nares once daily (Patient taking differently: Place 2 sprays into the nose daily as needed (for allergies.).)   Multiple Vitamin (MULTIVITAMIN WITH MINERALS) TABS tablet Take 1 tablet by mouth in the morning.   omeprazole (PRILOSEC) 40 MG capsule Take 40 mg by mouth daily.   OZEMPIC, 2 MG/DOSE, 8 MG/3ML SOPN Inject 3 mg into the skin every Saturday.   pioglitazone (ACTOS) 30 MG tablet Take 30 mg by mouth daily at 12 noon.    pravastatin (PRAVACHOL) 20 MG tablet Take 20 mg by mouth in the morning.   tamsulosin (FLOMAX) 0.4 MG CAPS capsule Take 0.4 mg by mouth every evening.   TRESIBA FLEXTOUCH 200 UNIT/ML FlexTouch Pen Inject 35 Units into the skin in the morning.   UNABLE  TO FIND Inject 1 Dose as directed every 14 (fourteen) days. Allergy shots [place] dr Madie Reno   furosemide (LASIX) 20 MG tablet Take 1 tablet (20 mg total) by mouth daily. (Patient not taking: Reported on 08/20/2021)   No current facility-administered medications for this visit. (Other)   REVIEW OF SYSTEMS: ROS   Positive for: Endocrine, Cardiovascular, Eyes Last edited by Etheleen Mayhew, COT on 09/16/2022  8:37 AM.     ALLERGIES Allergies  Allergen Reactions   Augmentin [Amoxicillin-Pot Clavulanate] Other (See Comments)    Thrush and yeast infection Has patient had a PCN reaction causing immediate rash, facial/tongue/throat swelling, SOB or  lightheadedness with hypotension: No Has patient had a PCN reaction causing severe rash involving mucus membranes or skin necrosis: No Has patient had a PCN reaction that required hospitalization: No Has patient had a PCN reaction occurring within the last 10 years: No If all of the above answers are "NO", then may proceed with Cephalosporin use.     Codeine Itching   PAST MEDICAL HISTORY Past Medical History:  Diagnosis Date   Arthritis    Atrial flutter (HCC) 02/26/2011   s/p RFCA 02/2011   Bronchitis    h/o recurrent bronchitis; "usually get it 1-2X/wy; last time 12/2010"   Cancer [facility]) 2011   right ear,basal   Constipation    Diabetes mellitus    "pills"   Diarrhea    GERD (gastroesophageal reflux disease)    Gout    "get it in my hands and feet"   Headache    sinus headache   Hemorrhoids    Hyperlipidemia    Hypertension    Kidney stone    1999   Seasonal allergies 02/26/2011   "I take an allergy shot q week"   Sleep apnea    no cpap used much since nasal surgery yrs ago   Past Surgical History:  Procedure Laterality Date   ATRIAL FLUTTER ABLATION N/A 02/26/2011   Procedure: ATRIAL FLUTTER ABLATION;  Surgeon: Duke Salvia, MD;  Location: [facility] CATH LAB;  Service: Cardiovascular;  Laterality: N/A;   BACK SURGERY  before 1995 and in 1995;    "ruptured disc repaired; lower back"   CARDIAC ELECTROPHYSIOLOGY STUDY AND ABLATION  02/26/2011   for atrial fib   COLONOSCOPY WITH PROPOFOL N/A 01/18/2015   Procedure: COLONOSCOPY WITH PROPOFOL;  Surgeon: Jeani Hawking, MD;  Location: [facility] ENDOSCOPY;  Service: Endoscopy;  Laterality: N/A;   COLONOSCOPY WITH PROPOFOL N/A 02/04/2018   Procedure: COLONOSCOPY WITH PROPOFOL;  Surgeon: Jeani Hawking, MD;  Location: [facility] ENDOSCOPY;  Service: Endoscopy;  Laterality: N/A;   COLONOSCOPY WITH PROPOFOL N/A 08/22/2021   Procedure: COLONOSCOPY WITH PROPOFOL;  Surgeon: Jeani Hawking, MD;  Location: [facility] ENDOSCOPY;  Service: Endoscopy;  Laterality: N/A;    ESOPHAGOGASTRODUODENOSCOPY (EGD) WITH PROPOFOL N/A 08/22/2021   Procedure: ESOPHAGOGASTRODUODENOSCOPY (EGD) WITH PROPOFOL;  Surgeon: Jeani Hawking, MD;  Location: [facility] ENDOSCOPY;  Service: Endoscopy;  Laterality: N/A;   LUMBAR DISC SURGERY  ~ 1993   "put foam in back; in place of disc"   LUMBAR DISC SURGERY  04/1993   "replaced gel foam that had blown out"   nasal septum surgery to open up sinuses  yrs ago   dr Emeline Darling   POLYPECTOMY  02/04/2018   Procedure: POLYPECTOMY;  Surgeon: Jeani Hawking, MD;  Location: [facility] ENDOSCOPY;  Service: Endoscopy;;   POLYPECTOMY  08/22/2021   Procedure: POLYPECTOMY;  Surgeon: Jeani Hawking, MD;  Location: [facility] ENDOSCOPY;  Service: Endoscopy;;   SKIN  CANCER EXCISION  2011   right ear   squamous cell area removed from right arm  6 weeks ago   FAMILY HISTORY Family History  Problem Relation Age of Onset   Heart attack Father    SOCIAL HISTORY Social History   Tobacco Use   Smoking status: Never   Smokeless tobacco: Current    Types: Snuff   Tobacco comments:    "stopped cigars ~ 2002"  Vaping Use   Vaping Use: Never used  Substance Use Topics   Alcohol use: No   Drug use: No       OPHTHALMIC EXAM:  Base Eye Exam     Visual Acuity (Snellen - Linear)       Right Left   Dist [place] 20/30 -2 20/25 -2   Dist ph [address] NI NI         Tonometry (Tonopen, 8:45 AM)       Right Left   Pressure 16 13         Pupils       Pupils Dark Light Shape React APD   Right PERRL 4 3 Round Brisk None   Left PERRL 4 3 Round Brisk None         Visual Fields       Left Right    Full Full         Extraocular Movement       Right Left    Full, Ortho Full, Ortho         Neuro/Psych     Oriented x3: Yes   Mood/Affect: Normal         Dilation     Both eyes: 2.5% Phenylephrine @ 8:45 AM           Slit Lamp and Fundus Exam     External Exam       Right Left   External Normal Normal         Slit Lamp Exam       Right Left    Lids/Lashes Dermatochalasis - upper lid Dermatochalasis - upper lid   Conjunctiva/Sclera Nasal and temporal Pinguecula Nasal and temporal Pinguecula   Cornea Debris in tear film Debris in tear film   Anterior Chamber Deep and clear Deep and clear   Iris Round and dilated, No NVI Round and dilated, No NVI   Lens 1-2+ Nuclear sclerosis, 1-2+ Cortical cataract 1-2+ Nuclear sclerosis, 1-2+ Cortical cataract   Anterior Vitreous Vitreous syneresis Vitreous syneresis         Fundus Exam       Right Left   Disc Pink and sharp, mild PPA Pink and sharp, mild PPA   C/D Ratio 0.4 0.5   Macula Flat, Blunted foveal reflex, focal MA/edema inferior macula Flat, Blunted foveal reflex, focal MA/edema/CWS temporal macula   Vessels Vascular attenuation, Telangiectasia, no NV Vascular attenuation, Telangiectasia, no NV   Periphery Attached, scattered MA greatest posteriorly Attached, scattered MA/DBH           Refraction     Manifest Refraction       Sphere Cylinder Axis Dist VA   Right -0.25 +0.50 180 20/30   Left -0.25 Sphere  20/25-2           IMAGING AND PROCEDURES  Imaging and Procedures for 09/16/2022  OCT, Retina - OU - Both Eyes       Right Eye Quality was good. Central Foveal Thickness: 367. Progression has no prior data. Findings include no SRF,  abnormal foveal contour, intraretinal hyper-reflective material, intraretinal fluid, vitreomacular adhesion (+IRF/IRHM inferior fovea and macula and SN mac).   Left Eye Quality was good. Central Foveal Thickness: 352. Progression has no prior data. Findings include no SRF, abnormal foveal contour, intraretinal hyper-reflective material, intraretinal fluid, vitreomacular adhesion (+IRF/IRHM temporal fovea and macula).   Notes *Images captured and stored on drive  Diagnosis / Impression:  +DME OU  OD: +IRF/IRHM inferior fovea and macula and SN mac OS: +IRF/IRHM temporal fovea and macula  Clinical management:  See  below  Abbreviations: NFP - Normal foveal profile. CME - cystoid macular edema. PED - pigment epithelial detachment. IRF - intraretinal fluid. SRF - subretinal fluid. EZ - ellipsoid zone. ERM - epiretinal membrane. ORA - outer retinal atrophy. ORT - outer retinal tubulation. SRHM - subretinal hyper-reflective material. IRHM - intraretinal hyper-reflective material      Fluorescein Angiography Optos (Transit OD)       Right Eye Progression has no prior data. Early phase findings include microaneurysm. Mid/Late phase findings include leakage, microaneurysm (Scattered leaking MA greatest inferior macula, no NV).   Left Eye Progression has no prior data. Early phase findings include microaneurysm. Mid/Late phase findings include leakage, microaneurysm (Scattered leaking MA greatest temporal macula, no NV).   Notes *Images captured and stored on drive  Diagnosis / Impression:  Mod NPDR OU OD: Scattered leaking MA greatest inferior macula, no NV OS: Scattered leaking MA greatest temporal macula, no NV  Clinical management:  See below  Abbreviations: NFP - Normal foveal profile. CME - cystoid macular edema. PED - pigment epithelial detachment. IRF - intraretinal fluid. SRF - subretinal fluid. EZ - ellipsoid zone. ERM - epiretinal membrane. ORA - outer retinal atrophy. ORT - outer retinal tubulation. SRHM - subretinal hyper-reflective material. IRHM - intraretinal hyper-reflective material      Intravitreal Injection, Pharmacologic Agent - OD - Right Eye       Time Out 09/16/2022. 9:59 AM. Confirmed correct patient, procedure, site, and patient consented.   Anesthesia Topical anesthesia was used. Anesthetic medications included Lidocaine 2%, Proparacaine 0.5%.   Procedure Preparation included 5% betadine to ocular surface, eyelid speculum. A (32g) needle was used.   Injection: 1.25 mg Bevacizumab 1.25mg /0.1ml   Route: Intravitreal, Site: Right Eye   NDC: [id number], Lot:  [id number], Expiration date: 12/19/2022   Post-op Post injection exam found visual acuity of at least counting fingers. The patient tolerated the procedure well. There were no complications. The patient received written and verbal post procedure care education.           ASSESSMENT/PLAN:   ICD-10-CM   1. Moderate nonproliferative diabetic retinopathy of both eyes with macular edema associated with type 2 diabetes mellitus (HCC)  E11.3313 OCT, Retina - OU - Both Eyes    Fluorescein Angiography Optos (Transit OD)    Intravitreal Injection, Pharmacologic Agent - OD - Right Eye    Bevacizumab (AVASTIN) SOLN 1.25 mg    2. Current use of insulin (HCC)  Z79.4     3. Diabetes mellitus treated with oral medication (HCC)  E11.9    Z79.84     4. Essential hypertension  I10     5. Hypertensive retinopathy of both eyes  H35.033     6. Combined forms of age-related cataract of both eyes  H25.813      1-3. Moderate Non-proliferative diabetic retinopathy w/ DME, both eyes - The incidence, risk factors for progression, natural history and treatment options for diabetic retinopathy were discussed with patient.   -  The need for close monitoring of blood glucose, blood pressure, and serum lipids, avoiding cigarette or any type of tobacco, and the need for long term follow up was also discussed with patient. - Latest A1C 8.2 on 5.29.24 - BCVA 20/30 OD, 20/25 OS - FA (05.29.24) + Mod NPDR OU, shows OD: Scattered leaking MA greatest inferior macula, no NV, OS; Scattered leaking MA greatest temporal macula, no NV - OCT shows + DME OU, OD; +IRF/IRHM inferior fovea and macula and SN mac, OS; +IRF/IRHM temporal  fovea and macula - The natural history, pathology, and characteristics of diabetic macular edema discussed with patient.  A generalized discussion of the major clinical trials concerning treatment of diabetic macular edema (ETDRS, DCT, SCORE, RISE / RIDE, and ongoing DRCR net studies) was completed.   This discussion included mention of the various approaches to treating diabetic macular edema (observation, laser photocoagulation, anti-VEGF injections with lucentis / Avastin / Eylea, steroid injections with Kenalog / Ozurdex, and intraocular surgery with vitrectomy).  The goal hemoglobin A1C of 6-7 was discussed, as well as importance of smoking cessation and hypertension control.  Need for ongoing treatment and monitoring were specifically discussed with reference to chronic nature of diabetic macular edema. - recommend IVA #1 OD today, 5.29.24 - pt wishes to proceed - RBA of procedure discussed, questions answered  - IVA informed consent obtained and signed OU 5.29.24 - see procedure note - f/u in 4 wks -- DFE/OCT, possible injection   4,5. Hypertensive retinopathy OU - discussed importance of tight BP control - monitor   6. Mixed Cataract OU - The symptoms of cataract, surgical options, and treatments and risks were discussed with patient. - discussed diagnosis and progression - monitor .  Ophthalmic Meds Ordered this visit:  Meds ordered this encounter  Medications   Bevacizumab (AVASTIN) SOLN 1.25 mg     Return in about 4 weeks (around 10/14/2022) for f/u Mod NPDR OU, DFE, OCT, Possible, IVA.  There are no Patient Instructions on file for this visit.  Explained the diagnoses, plan, and follow up with the patient and they expressed understanding.  Patient expressed understanding of the importance of proper follow up care.   This document serves as a record of services personally performed by Karie Chimera, MD, PhD. It was created on their behalf by De Blanch, an ophthalmic technician. The creation of this record is the provider's dictation and/or activities during the visit.    Electronically signed by: De Blanch, OA, 09/16/22  12:03 PM  This document serves as a record of services personally performed by Karie Chimera, MD, PhD. It was created on their behalf  by Gerilyn Nestle, COT an ophthalmic technician. The creation of this record is the provider's dictation and/or activities during the visit.    Electronically signed by:  Gerilyn Nestle, COT  5.29.24 12:03 PM   Karie Chimera, M.D., Ph.D. Diseases & Surgery of the Retina and Vitreous [facility] 09/16/2022  I have reviewed the above documentation for accuracy and completeness, and I agree with the above. Karie Chimera, M.D., Ph.D. 09/16/22 12:11 PM   Abbreviations: M myopia (nearsighted); A astigmatism; H hyperopia (farsighted); P presbyopia; Mrx spectacle prescription;  CTL contact lenses; OD right eye; OS left eye; OU both eyes  XT exotropia; ET esotropia; PEK punctate epithelial keratitis; PEE punctate epithelial erosions; DES dry eye syndrome; MGD meibomian gland dysfunction; ATs artificial tears; PFAT's preservative free artificial tears; NSC nuclear sclerotic cataract; PSC  posterior subcapsular cataract; ERM epi-retinal membrane; PVD posterior vitreous detachment; RD retinal detachment; DM diabetes mellitus; DR diabetic retinopathy; NPDR non-proliferative diabetic retinopathy; PDR proliferative diabetic retinopathy; CSME clinically significant macular edema; DME diabetic macular edema; dbh dot blot hemorrhages; CWS cotton wool spot; POAG primary open angle glaucoma; C/D cup-to-disc ratio; HVF humphrey visual field; GVF goldmann visual field; OCT optical coherence tomography; IOP intraocular pressure; BRVO Branch retinal vein occlusion; CRVO central retinal vein occlusion; CRAO central retinal artery occlusion; BRAO branch retinal artery occlusion; RT retinal tear; SB scleral buckle; PPV pars plana vitrectomy; VH Vitreous hemorrhage; PRP panretinal laser photocoagulation; IVK intravitreal kenalog; VMT vitreomacular traction; MH Macular hole;  NVD neovascularization of the disc; NVE neovascularization elsewhere; AREDS age related eye disease study; ARMD age related  macular degeneration; POAG primary open angle glaucoma; EBMD epithelial/anterior basement membrane dystrophy; ACIOL anterior chamber intraocular lens; IOL intraocular lens; PCIOL posterior chamber intraocular lens; Phaco/IOL phacoemulsification with intraocular lens placement; PRK photorefractive keratectomy; LASIK laser assisted in situ keratomileusis; HTN hypertension; DM diabetes mellitus; COPD chronic obstructive pulmonary disease

## 2022-09-10 ENCOUNTER — Encounter (INDEPENDENT_AMBULATORY_CARE_PROVIDER_SITE_OTHER): Payer: Federal, State, Local not specified - PPO | Admitting: Ophthalmology

## 2022-09-16 ENCOUNTER — Encounter (INDEPENDENT_AMBULATORY_CARE_PROVIDER_SITE_OTHER): Payer: Self-pay | Admitting: Ophthalmology

## 2022-09-16 ENCOUNTER — Ambulatory Visit (INDEPENDENT_AMBULATORY_CARE_PROVIDER_SITE_OTHER): Payer: Medicare Other | Admitting: Ophthalmology

## 2022-09-16 DIAGNOSIS — I10 Essential (primary) hypertension: Secondary | ICD-10-CM | POA: Diagnosis not present

## 2022-09-16 DIAGNOSIS — H35033 Hypertensive retinopathy, bilateral: Secondary | ICD-10-CM

## 2022-09-16 DIAGNOSIS — Z794 Long term (current) use of insulin: Secondary | ICD-10-CM | POA: Diagnosis not present

## 2022-09-16 DIAGNOSIS — E119 Type 2 diabetes mellitus without complications: Secondary | ICD-10-CM

## 2022-09-16 DIAGNOSIS — H3581 Retinal edema: Secondary | ICD-10-CM

## 2022-09-16 DIAGNOSIS — H25813 Combined forms of age-related cataract, bilateral: Secondary | ICD-10-CM

## 2022-09-16 DIAGNOSIS — E113313 Type 2 diabetes mellitus with moderate nonproliferative diabetic retinopathy with macular edema, bilateral: Secondary | ICD-10-CM

## 2022-09-16 DIAGNOSIS — Z7984 Long term (current) use of oral hypoglycemic drugs: Secondary | ICD-10-CM

## 2022-09-16 MED ORDER — BEVACIZUMAB CHEMO INJECTION 1.25MG/0.05ML SYRINGE FOR KALEIDOSCOPE
1.2500 mg | INTRAVITREAL | Status: AC | PRN
Start: 2022-09-16 — End: 2022-09-16
  Administered 2022-09-16: 1.25 mg via INTRAVITREAL

## 2022-10-13 NOTE — Progress Notes (Signed)
[facility] - Clinic Note  10/19/2022   CHIEF COMPLAINT Patient presents for Retina Follow Up  HISTORY OF PRESENT ILLNESS: Ryan Jones is a 67 y.o. male who presents to the clinic today for:  HPI     Retina Follow Up   Patient presents with  Diabetic Retinopathy.  In both eyes.  This started 4 weeks ago.  Duration of 4 weeks.  Since onset it is stable.  I, the attending physician,  performed the HPI with the patient and updated documentation appropriately.        Comments   4 week retina follow up NPDR OU and IVA OU pt is reporting no vision changes noticed he has seeing some floaters in the left eye his last reading was 197 this morning pt had COVID last week he is currently using refresh       Last edited by Ryan Chris, MD on 10/19/2022 12:04 PM.    Patient states he had COVID last week and is on antibiotics.   Referring physician: Olivia Canter, MD [address] [place],  [place] [address]  HISTORICAL INFORMATION:  Selected notes from the MEDICAL RECORD NUMBER Referred by Dr. Fabian Jones for DME OU LEE:  Ocular Hx- PMH-   CURRENT MEDICATIONS: No current outpatient medications on file. (Ophthalmic Drugs)   No current facility-administered medications for this visit. (Ophthalmic Drugs)   Current Outpatient Medications (Other)  Medication Sig   albuterol (PROVENTIL HFA;VENTOLIN HFA) 108 (90 Base) MCG/ACT inhaler Inhale 2 puffs into the lungs every 6 (six) hours as needed. For shortness of breath.   amLODipine (NORVASC) 10 MG tablet Take 10 mg by mouth in the morning.   carvedilol (COREG) 6.25 MG tablet Take 1 tablet (6.25 mg total) by mouth 2 (two) times daily.   EPINEPHrine 0.3 mg/0.3 mL IJ SOAJ injection Inject 0.3 mg into the muscle as needed for anaphylaxis.   furosemide (LASIX) 20 MG tablet Take 1 tablet (20 mg total) by mouth daily. (Patient not taking: Reported on 08/20/2021)   glipiZIDE (GLUCOTROL) 5 MG tablet Take 5 mg by mouth  in the morning.   hydrochlorothiazide (HYDRODIURIL) 25 MG tablet Take 25 mg by mouth in the morning.   HYDROcodone-acetaminophen (NORCO/VICODIN) 5-325 MG tablet Take 1 tablet by mouth every 4 (four) hours as needed.   ibuprofen (ADVIL,MOTRIN) 800 MG tablet Take 800 mg by mouth every 8 (eight) hours as needed (for pain.).    indomethacin (INDOCIN) 50 MG capsule TAKE 1 CAPSULE BY MOUTH THREE TIMES DAILY AS NEEDED FOR MODERATE PAIN (GOUT)   losartan (COZAAR) 100 MG tablet TAKE 1 TABLET BY MOUTH EVERY DAY   metFORMIN (GLUCOPHAGE) 1000 MG tablet Take 1 tablet (1,000 mg total) by mouth 2 (two) times daily with a meal.   mometasone (NASONEX) 50 MCG/ACT nasal spray 2 sprays each nares once daily (Patient taking differently: Place 2 sprays into the nose daily as needed (for allergies.).)   Multiple Vitamin (MULTIVITAMIN WITH MINERALS) TABS tablet Take 1 tablet by mouth in the morning.   omeprazole (PRILOSEC) 40 MG capsule Take 40 mg by mouth daily.   OZEMPIC, 2 MG/DOSE, 8 MG/3ML SOPN Inject 3 mg into the skin every Saturday.   pioglitazone (ACTOS) 30 MG tablet Take 30 mg by mouth daily at 12 noon.    pravastatin (PRAVACHOL) 20 MG tablet Take 20 mg by mouth in the morning.   tamsulosin (FLOMAX) 0.4 MG CAPS capsule Take 0.4 mg by mouth  every evening.   TRESIBA FLEXTOUCH 200 UNIT/ML FlexTouch Pen Inject 35 Units into the skin in the morning.   UNABLE TO FIND Inject 1 Dose as directed every 14 (fourteen) days. Allergy shots [place] dr Ryan Jones   No current facility-administered medications for this visit. (Other)   REVIEW OF SYSTEMS: ROS   Positive for: Endocrine, Cardiovascular, Eyes Last edited by Ryan Jones, COT on 10/19/2022  9:43 AM.      ALLERGIES Allergies  Allergen Reactions   Augmentin [Amoxicillin-Pot Clavulanate] Other (See Comments)    Thrush and yeast infection Has patient had a PCN reaction causing immediate rash, facial/tongue/throat swelling, SOB or lightheadedness with  hypotension: No Has patient had a PCN reaction causing severe rash involving mucus membranes or skin necrosis: No Has patient had a PCN reaction that required hospitalization: No Has patient had a PCN reaction occurring within the last 10 years: No If all of the above answers are "NO", then may proceed with Cephalosporin use.     Codeine Itching   PAST MEDICAL HISTORY Past Medical History:  Diagnosis Date   Arthritis    Atrial flutter (HCC) 02/26/2011   s/p RFCA 02/2011   Bronchitis    h/o recurrent bronchitis; "usually get it 1-2X/wy; last time 12/2010"   Cancer [facility]) 2011   right ear,basal   Constipation    Diabetes mellitus    "pills"   Diarrhea    GERD (gastroesophageal reflux disease)    Gout    "get it in my hands and feet"   Headache    sinus headache   Hemorrhoids    Hyperlipidemia    Hypertension    Kidney stone    1999   Seasonal allergies 02/26/2011   "I take an allergy shot q week"   Sleep apnea    no cpap used much since nasal surgery yrs ago   Past Surgical History:  Procedure Laterality Date   ATRIAL FLUTTER ABLATION N/A 02/26/2011   Procedure: ATRIAL FLUTTER ABLATION;  Surgeon: Ryan Salvia, MD;  Location: [facility] CATH LAB;  Service: Cardiovascular;  Laterality: N/A;   BACK SURGERY  before 1995 and in 1995;    "ruptured disc repaired; lower back"   CARDIAC ELECTROPHYSIOLOGY STUDY AND ABLATION  02/26/2011   for atrial fib   COLONOSCOPY WITH PROPOFOL N/A 01/18/2015   Procedure: COLONOSCOPY WITH PROPOFOL;  Surgeon: Ryan Hawking, MD;  Location: [facility] ENDOSCOPY;  Service: Endoscopy;  Laterality: N/A;   COLONOSCOPY WITH PROPOFOL N/A 02/04/2018   Procedure: COLONOSCOPY WITH PROPOFOL;  Surgeon: Ryan Hawking, MD;  Location: [facility]OS[facility];  Service: Endoscopy;  Laterality: N/A;   COLONOSCOPY WITH PROPOFOL N/A 08/22/2021   Procedure: COLONOSCOPY WITH PROPOFOL;  Surgeon: Ryan Hawking, MD;  Location: [facility]OS[facility];  Service: Endoscopy;  Laterality: N/A;    ESOPHAGOGASTRODUODENOSCOPY (EGD) WITH PROPOFOL N/A 08/22/2021   Procedure: ESOPHAGOGASTRODUODENOSCOPY (EGD) WITH PROPOFOL;  Surgeon: Ryan Hawking, MD;  Location: [facility];  Service: Endoscopy;  Laterality: N/A;   LUMBAR DISC SURGERY  ~ 1993   "put foam in back; in place of disc"   LUMBAR DISC SURGERY  04/1993   "replaced gel foam that had blown out"   nasal septum surgery to open up sinuses  yrs ago   dr Emeline Darling   POLYPECTOMY  02/04/2018   Procedure: POLYPECTOMY;  Surgeon: Ryan Hawking, MD;  Location: [facility];  Service: Endoscopy;;   POLYPECTOMY  08/22/2021   Procedure: POLYPECTOMY;  Surgeon: Ryan Hawking, MD;  Location: [facility]OS[facility];  Service: Endoscopy;;  SKIN CANCER EXCISION  2011   right ear   squamous cell area removed from right arm  6 weeks ago   FAMILY HISTORY Family History  Problem Relation Age of Onset   Heart attack Father    SOCIAL HISTORY Social History   Tobacco Use   Smoking status: Never   Smokeless tobacco: Current    Types: Snuff   Tobacco comments:    "stopped cigars ~ 2002"  Vaping Use   Vaping Use: Never used  Substance Use Topics   Alcohol use: No   Drug use: No       OPHTHALMIC EXAM:  Base Eye Exam     Visual Acuity (Snellen - Linear)       Right Left   Dist [place] 20/25 20/30 -2   Dist ph [place] 20/25 -1         Tonometry (Tonopen, 9:50 AM)       Right Left   Pressure 15 14         Pupils       Pupils Dark Light Shape React APD   Right PERRL 4 3 Round Brisk None   Left PERRL 4 3 Round Brisk None         Visual Fields       Left Right    Full Full         Extraocular Movement       Right Left    Full, Ortho Full, Ortho         Neuro/Psych     Oriented x3: Yes   Mood/Affect: Normal         Dilation     Both eyes: 2.5% Phenylephrine @ 9:50 AM           Slit Lamp and Fundus Exam     External Exam       Right Left   External Normal Normal         Slit Lamp Exam       Right Left    Lids/Lashes Dermatochalasis - upper lid Dermatochalasis - upper lid   Conjunctiva/Sclera Nasal and temporal Pinguecula Nasal and temporal Pinguecula   Cornea Debris in tear film Debris in tear film   Anterior Chamber Deep and clear Deep and clear   Iris Round and dilated, No NVI Round and dilated, No NVI   Lens 1-2+ Nuclear sclerosis, 1-2+ Cortical cataract 1-2+ Nuclear sclerosis, 1-2+ Cortical cataract   Anterior Vitreous Vitreous syneresis Vitreous syneresis         Fundus Exam       Right Left   Disc Pink and Jones, mild PPA Pink and Jones, mild PPA   C/D Ratio 0.4 0.5   Macula Flat, Blunted foveal reflex, focal MA/edema inferior macula, SN edema improved Flat, Blunted foveal reflex, focal MA/edema/CWS temporal macula--slightly increased   Vessels Vascular attenuation, Telangiectasia, no NV Vascular attenuation, Telangiectasia, no NV   Periphery Attached, scattered MA/DBH greatest posteriorly Attached, scattered MA/DBH           IMAGING AND PROCEDURES  Imaging and Procedures for 10/19/2022  OCT, Retina - OU - Both Eyes       Right Eye Quality was good. Central Foveal Thickness: 383. Progression has been stable. Findings include no SRF, abnormal foveal contour, intraretinal hyper-reflective material, intraretinal fluid, vitreomacular adhesion (Persistent cystic changes +IRF/IRHM inferior fovea and macula and mild interval improvement in SN mac).   Left Eye Quality was good. Central Foveal Thickness: 386. Progression has  worsened. Findings include no SRF, abnormal foveal contour, intraretinal hyper-reflective material, intraretinal fluid, vitreomacular adhesion (Interval increase in +IRF/IRHM temporal fovea and macula).   Notes *Images captured and stored on drive  Diagnosis / Impression:  +DME OU  OD: Persistent cystic changes +IRF/IRHM inferior fovea and macula and mild interval improvement in SN mac OS: Interval increase in +IRF/IRHM temporal  fovea and macula  Clinical  management:  See below  Abbreviations: NFP - Normal foveal profile. CME - cystoid macular edema. PED - pigment epithelial detachment. IRF - intraretinal fluid. SRF - subretinal fluid. EZ - ellipsoid zone. ERM - epiretinal membrane. ORA - outer retinal atrophy. ORT - outer retinal tubulation. SRHM - subretinal hyper-reflective material. IRHM - intraretinal hyper-reflective material      Intravitreal Injection, Pharmacologic Agent - OD - Right Eye       Time Out 10/19/2022. 10:57 AM. Confirmed correct patient, procedure, site, and patient consented.   Anesthesia Topical anesthesia was used. Anesthetic medications included Lidocaine 2%, Proparacaine 0.5%.   Procedure Preparation included 5% betadine to ocular surface, eyelid speculum. A (32g) needle was used.   Injection: 1.25 mg Bevacizumab 1.25mg /0.81ml   Route: Intravitreal, Site: Right Eye   NDC: P3213405, Lot: [id number], Expiration date: 11/30/2022   Post-op Post injection exam found visual acuity of at least counting fingers. The patient tolerated the procedure well. There were no complications. The patient received written and verbal post procedure care education.      Intravitreal Injection, Pharmacologic Agent - OS - Left Eye       Time Out 10/19/2022. 10:57 AM. Confirmed correct patient, procedure, site, and patient consented.   Anesthesia Topical anesthesia was used. Anesthetic medications included Lidocaine 2%, Proparacaine 0.5%.   Procedure Preparation included 5% betadine to ocular surface, eyelid speculum.   Injection: 1.25 mg Bevacizumab 1.25mg /0.70ml   Route: Intravitreal, Site: Left Eye   NDC: P3213405, Lot: [id number], Expiration date: 01/15/2023   Post-op Post injection exam found visual acuity of at least counting fingers. The patient tolerated the procedure well. There were no complications. The patient received written and verbal post procedure care education.           ASSESSMENT/PLAN:    ICD-10-CM   1. Moderate nonproliferative diabetic retinopathy of both eyes with macular edema associated with type 2 diabetes mellitus (HCC)  E11.3313 OCT, Retina - OU - Both Eyes    Intravitreal Injection, Pharmacologic Agent - OD - Right Eye    Intravitreal Injection, Pharmacologic Agent - OS - Left Eye    Bevacizumab (AVASTIN) SOLN 1.25 mg    Bevacizumab (AVASTIN) SOLN 1.25 mg    2. Current use of insulin (HCC)  Z79.4     3. Diabetes mellitus treated with oral medication (HCC)  E11.9    Z79.84     4. Essential hypertension  I10     5. Hypertensive retinopathy of both eyes  H35.033     6. Combined forms of age-related cataract of both eyes  H25.813      1-3. Moderate Non-proliferative diabetic retinopathy w/ DME, both eyes  - s/p IVA OD #1 (05.29.24) - Latest A1C 8.2 on 5.29.24 - BCVA OD 20/25 from 20/30, OS 20/25 - FA (05.29.24) + Mod NPDR OU, shows OD: Scattered leaking MA greatest inferior macula, no NV, OS; Scattered leaking MA greatest temporal macula, no NV - OCT shows +DME OU, OD; Persistent cystic changes +IRF/IRHM inferior fovea and macula and mild interval improvement in SN mac OS; Interval increase in +IRF/IRHM temporal  fovea and macula - recommend IVA #2 OD and IVA OS #1  today, 07.01.24 - pt wishes to proceed - RBA of procedure discussed, questions answered  - IVA informed consent obtained and signed OU 5.29.24 - see procedure note - f/u in 4 wks -- DFE/OCT, possible injection   4,5. Hypertensive retinopathy OU - discussed importance of tight BP control - monitor   6. Mixed Cataract OU - The symptoms of cataract, surgical options, and treatments and risks were discussed with patient. - discussed diagnosis and progression - monitor  Ophthalmic Meds Ordered this visit:  Meds ordered this encounter  Medications   Bevacizumab (AVASTIN) SOLN 1.25 mg   Bevacizumab (AVASTIN) SOLN 1.25 mg     Return in about 4 weeks (around 11/16/2022) for Mod NPDR OU, DFE, OCT,  Possible, IVA, OU.  There are no Patient Instructions on file for this visit.  Explained the diagnoses, plan, and follow up with the patient and they expressed understanding.  Patient expressed understanding of the importance of proper follow up care.   This document serves as a record of services personally performed by Karie Chimera, MD, PhD. It was created on their behalf by Glee Arvin. Manson Passey, OA an ophthalmic technician. The creation of this record is the provider's dictation and/or activities during the visit.    Electronically signed by: Glee Arvin. Manson Passey, [place] 06.25.2024 12:05 PM  This document serves as a record of services personally performed by Karie Chimera, MD, PhD. It was created on their behalf by Gerilyn Nestle, COT an ophthalmic technician. The creation of this record is the provider's dictation and/or activities during the visit.    Electronically signed by:  Charlette Caffey, COT  10/19/22 12:05 PM  Karie Chimera, M.D., Ph.D. Diseases & Surgery of the Retina and Vitreous [facility] 10/19/2022  I have reviewed the above documentation for accuracy and completeness, and I agree with the above. Karie Chimera, M.D., Ph.D. 10/19/22 12:06 PM  Abbreviations: M myopia (nearsighted); A astigmatism; H hyperopia (farsighted); P presbyopia; Mrx spectacle prescription;  CTL contact lenses; OD right eye; OS left eye; OU both eyes  XT exotropia; ET esotropia; PEK punctate epithelial keratitis; PEE punctate epithelial erosions; DES dry eye syndrome; MGD meibomian gland dysfunction; ATs artificial tears; PFAT's preservative free artificial tears; NSC nuclear sclerotic cataract; PSC posterior subcapsular cataract; ERM epi-retinal membrane; PVD posterior vitreous detachment; RD retinal detachment; DM diabetes mellitus; DR diabetic retinopathy; NPDR non-proliferative diabetic retinopathy; PDR proliferative diabetic retinopathy; CSME clinically significant macular  edema; DME diabetic macular edema; dbh dot blot hemorrhages; CWS cotton wool spot; POAG primary open angle glaucoma; C/D cup-to-disc ratio; HVF humphrey visual field; GVF goldmann visual field; OCT optical coherence tomography; IOP intraocular pressure; BRVO Branch retinal vein occlusion; CRVO central retinal vein occlusion; CRAO central retinal artery occlusion; BRAO branch retinal artery occlusion; RT retinal tear; SB scleral buckle; PPV pars plana vitrectomy; VH Vitreous hemorrhage; PRP panretinal laser photocoagulation; IVK intravitreal kenalog; VMT vitreomacular traction; MH Macular hole;  NVD neovascularization of the disc; NVE neovascularization elsewhere; AREDS age related eye disease study; ARMD age related macular degeneration; POAG primary open angle glaucoma; EBMD epithelial/anterior basement membrane dystrophy; ACIOL anterior chamber intraocular lens; IOL intraocular lens; PCIOL posterior chamber intraocular lens; Phaco/IOL phacoemulsification with intraocular lens placement; PRK photorefractive keratectomy; LASIK laser assisted in situ keratomileusis; HTN hypertension; DM diabetes mellitus; COPD chronic obstructive pulmonary disease

## 2022-10-14 ENCOUNTER — Encounter (INDEPENDENT_AMBULATORY_CARE_PROVIDER_SITE_OTHER): Payer: Federal, State, Local not specified - PPO | Admitting: Ophthalmology

## 2022-10-14 DIAGNOSIS — E119 Type 2 diabetes mellitus without complications: Secondary | ICD-10-CM

## 2022-10-14 DIAGNOSIS — I10 Essential (primary) hypertension: Secondary | ICD-10-CM

## 2022-10-14 DIAGNOSIS — Z794 Long term (current) use of insulin: Secondary | ICD-10-CM

## 2022-10-14 DIAGNOSIS — E113313 Type 2 diabetes mellitus with moderate nonproliferative diabetic retinopathy with macular edema, bilateral: Secondary | ICD-10-CM

## 2022-10-14 DIAGNOSIS — H25813 Combined forms of age-related cataract, bilateral: Secondary | ICD-10-CM

## 2022-10-14 DIAGNOSIS — H35033 Hypertensive retinopathy, bilateral: Secondary | ICD-10-CM

## 2022-10-19 ENCOUNTER — Ambulatory Visit (INDEPENDENT_AMBULATORY_CARE_PROVIDER_SITE_OTHER): Payer: Medicare Other | Admitting: Ophthalmology

## 2022-10-19 ENCOUNTER — Encounter (INDEPENDENT_AMBULATORY_CARE_PROVIDER_SITE_OTHER): Payer: Self-pay | Admitting: Ophthalmology

## 2022-10-19 DIAGNOSIS — I10 Essential (primary) hypertension: Secondary | ICD-10-CM

## 2022-10-19 DIAGNOSIS — H35033 Hypertensive retinopathy, bilateral: Secondary | ICD-10-CM

## 2022-10-19 DIAGNOSIS — E113313 Type 2 diabetes mellitus with moderate nonproliferative diabetic retinopathy with macular edema, bilateral: Secondary | ICD-10-CM

## 2022-10-19 DIAGNOSIS — Z7984 Long term (current) use of oral hypoglycemic drugs: Secondary | ICD-10-CM | POA: Diagnosis not present

## 2022-10-19 DIAGNOSIS — H25813 Combined forms of age-related cataract, bilateral: Secondary | ICD-10-CM

## 2022-10-19 DIAGNOSIS — E119 Type 2 diabetes mellitus without complications: Secondary | ICD-10-CM

## 2022-10-19 DIAGNOSIS — Z794 Long term (current) use of insulin: Secondary | ICD-10-CM | POA: Diagnosis not present

## 2022-10-19 MED ORDER — BEVACIZUMAB CHEMO INJECTION 1.25MG/0.05ML SYRINGE FOR KALEIDOSCOPE
1.2500 mg | INTRAVITREAL | Status: AC | PRN
Start: 2022-10-19 — End: 2022-10-19
  Administered 2022-10-19: 1.25 mg via INTRAVITREAL

## 2022-11-13 NOTE — Progress Notes (Shared)
[facility] - Clinic Note  11/16/2022   CHIEF COMPLAINT Patient presents for Retina Follow Up  HISTORY OF PRESENT ILLNESS: Ryan Jones is a 67 y.o. male who presents to the clinic today for:  HPI     Retina Follow Up   Patient presents with  Diabetic Retinopathy.  This started 4 weeks ago.  Duration of 4 weeks.  Since onset it is stable.  I, the attending physician,  performed the HPI with the patient and updated documentation appropriately.        Comments   Retina follow up NPDR and IVA OU pt is reporting no vision changes noticed he has some floaters OD but denies flashes his last blood sugar reading 182 today       Last edited by Rennis Chris, MD on 11/16/2022 11:38 AM.    Patient states he stopped Monjaro and just upped the Actos.   Referring physician: Olivia Canter, MD [address] [place],  [place] [address]  HISTORICAL INFORMATION:  Selected notes from the MEDICAL RECORD NUMBER Referred by Dr. [name] for DME OU LEE:  Ocular Hx- PMH-   CURRENT MEDICATIONS: No current outpatient medications on file. (Ophthalmic Drugs)   No current facility-administered medications for this visit. (Ophthalmic Drugs)   Current Outpatient Medications (Other)  Medication Sig   albuterol (PROVENTIL HFA;VENTOLIN HFA) 108 (90 Base) MCG/ACT inhaler Inhale 2 puffs into the lungs every 6 (six) hours as needed. For shortness of breath.   amLODipine (NORVASC) 10 MG tablet Take 10 mg by mouth in the morning.   carvedilol (COREG) 6.25 MG tablet Take 1 tablet (6.25 mg total) by mouth 2 (two) times daily.   EPINEPHrine 0.3 mg/0.3 mL IJ SOAJ injection Inject 0.3 mg into the muscle as needed for anaphylaxis.   furosemide (LASIX) 20 MG tablet Take 1 tablet (20 mg total) by mouth daily. (Patient not taking: Reported on 08/20/2021)   glipiZIDE (GLUCOTROL) 5 MG tablet Take 5 mg by mouth in the morning.   hydrochlorothiazide (HYDRODIURIL) 25 MG tablet Take 25 mg  by mouth in the morning.   HYDROcodone-acetaminophen (NORCO/VICODIN) 5-325 MG tablet Take 1 tablet by mouth every 4 (four) hours as needed.   ibuprofen (ADVIL,MOTRIN) 800 MG tablet Take 800 mg by mouth every 8 (eight) hours as needed (for pain.).    indomethacin (INDOCIN) 50 MG capsule TAKE 1 CAPSULE BY MOUTH THREE TIMES DAILY AS NEEDED FOR MODERATE PAIN (GOUT)   losartan (COZAAR) 100 MG tablet TAKE 1 TABLET BY MOUTH EVERY DAY   metFORMIN (GLUCOPHAGE) 1000 MG tablet Take 1 tablet (1,000 mg total) by mouth 2 (two) times daily with a meal.   mometasone (NASONEX) 50 MCG/ACT nasal spray 2 sprays each nares once daily (Patient taking differently: Place 2 sprays into the nose daily as needed (for allergies.).)   Multiple Vitamin (MULTIVITAMIN WITH MINERALS) TABS tablet Take 1 tablet by mouth in the morning.   omeprazole (PRILOSEC) 40 MG capsule Take 40 mg by mouth daily.   OZEMPIC, 2 MG/DOSE, 8 MG/3ML SOPN Inject 3 mg into the skin every Saturday.   pioglitazone (ACTOS) 30 MG tablet Take 30 mg by mouth daily at 12 noon.    pravastatin (PRAVACHOL) 20 MG tablet Take 20 mg by mouth in the morning.   tamsulosin (FLOMAX) 0.4 MG CAPS capsule Take 0.4 mg by mouth every evening.   TRESIBA FLEXTOUCH 200 UNIT/ML FlexTouch Pen Inject 35 Units into the skin in the morning.  UNABLE TO FIND Inject 1 Dose as directed every 14 (fourteen) days. Allergy shots [place] dr Madie Reno   No current facility-administered medications for this visit. (Other)   REVIEW OF SYSTEMS: ROS   Positive for: Endocrine, Cardiovascular, Eyes Last edited by Etheleen Mayhew, COT on 11/16/2022  9:55 AM.       ALLERGIES Allergies  Allergen Reactions   Augmentin [Amoxicillin-Pot Clavulanate] Other (See Comments)    Thrush and yeast infection Has patient had a PCN reaction causing immediate rash, facial/tongue/throat swelling, SOB or lightheadedness with hypotension: No Has patient had a PCN reaction causing severe rash  involving mucus membranes or skin necrosis: No Has patient had a PCN reaction that required hospitalization: No Has patient had a PCN reaction occurring within the last 10 years: No If all of the above answers are "NO", then may proceed with Cephalosporin use.     Codeine Itching   PAST MEDICAL HISTORY Past Medical History:  Diagnosis Date   Arthritis    Atrial flutter (HCC) 02/26/2011   s/p RFCA 02/2011   Bronchitis    h/o recurrent bronchitis; "usually get it 1-2X/wy; last time 12/2010"   Cancer [facility]) 2011   right ear,basal   Constipation    Diabetes mellitus    "pills"   Diarrhea    GERD (gastroesophageal reflux disease)    Gout    "get it in my hands and feet"   Headache    sinus headache   Hemorrhoids    Hyperlipidemia    Hypertension    Kidney stone    1999   Seasonal allergies 02/26/2011   "I take an allergy shot q week"   Sleep apnea    no cpap used much since nasal surgery yrs ago   Past Surgical History:  Procedure Laterality Date   ATRIAL FLUTTER ABLATION N/A 02/26/2011   Procedure: ATRIAL FLUTTER ABLATION;  Surgeon: Duke Salvia, MD;  [facility]: [facility] CATH LAB;  Service: Cardiovascular;  Laterality: N/A;   BACK SURGERY  before 1995 and in 1995;    "ruptured disc repaired; lower back"   CARDIAC ELECTROPHYSIOLOGY STUDY AND ABLATION  02/26/2011   for atrial fib   COLONOSCOPY WITH PROPOFOL N/A 01/18/2015   Procedure: COLONOSCOPY WITH PROPOFOL;  Surgeon: Jeani Hawking, MD;  Location: [facility] ENDOSCOPY;  Service: Endoscopy;  Laterality: N/A;   COLONOSCOPY WITH PROPOFOL N/A 02/04/2018   Procedure: COLONOSCOPY WITH PROPOFOL;  Surgeon: Jeani Hawking, MD;  Location: [facility] ENDOSCOPY;  Service: Endoscopy;  Laterality: N/A;   COLONOSCOPY WITH PROPOFOL N/A 08/22/2021   Procedure: COLONOSCOPY WITH PROPOFOL;  Surgeon: Jeani Hawking, MD;  Location: [facility] ENDOSCOPY;  Service: Endoscopy;  Laterality: N/A;   ESOPHAGOGASTRODUODENOSCOPY (EGD) WITH PROPOFOL N/A 08/22/2021   Procedure:  ESOPHAGOGASTRODUODENOSCOPY (EGD) WITH PROPOFOL;  Surgeon: Jeani Hawking, MD;  Location: [facility] ENDOSCOPY;  Service: Endoscopy;  Laterality: N/A;   LUMBAR DISC SURGERY  ~ 1993   "put foam in back; in place of disc"   LUMBAR DISC SURGERY  04/1993   "replaced gel foam that had blown out"   nasal septum surgery to open up sinuses  yrs ago   dr Emeline Darling   POLYPECTOMY  02/04/2018   Procedure: POLYPECTOMY;  Surgeon: Jeani Hawking, MD;  Location: [facility] ENDOSCOPY;  Service: Endoscopy;;   POLYPECTOMY  08/22/2021   Procedure: POLYPECTOMY;  Surgeon: Jeani Hawking, MD;  Location: [facility] ENDOSCOPY;  Service: Endoscopy;;   SKIN CANCER EXCISION  2011   right ear   squamous cell area removed from right arm  6  weeks ago   FAMILY HISTORY Family History  Problem Relation Age of Onset   Heart attack Father    SOCIAL HISTORY Social History   Tobacco Use   Smoking status: Never   Smokeless tobacco: Current    Types: Snuff   Tobacco comments:    "stopped cigars ~ 2002"  Vaping Use   Vaping status: Never Used  Substance Use Topics   Alcohol use: No   Drug use: No       OPHTHALMIC EXAM:  Base Eye Exam     Visual Acuity (Snellen - Linear)       Right Left   Dist [place] 20/25 -2 20/30 -2   Dist ph [place] NI NI         Tonometry (Tonopen, 9:56 AM)       Right Left   Pressure 14 16         Pupils       Pupils Dark Light Shape React APD   Right PERRL 4 3 Round Brisk None   Left PERRL 4 3 Round Brisk None         Visual Fields       Left Right    Full Full         Extraocular Movement       Right Left    Full, Ortho Full, Ortho         Neuro/Psych     Oriented x3: Yes   Mood/Affect: Normal         Dilation     Both eyes: 2.5% Phenylephrine @ 9:58 AM           Slit Lamp and Fundus Exam     External Exam       Right Left   External Normal Normal         Slit Lamp Exam       Right Left   Lids/Lashes Dermatochalasis - upper lid Dermatochalasis - upper lid    Conjunctiva/Sclera Nasal and temporal Pinguecula Nasal and temporal Pinguecula   Cornea Debris in tear film Debris in tear film   Anterior Chamber Deep and clear Deep and clear   Iris Round and dilated, No NVI Round and dilated, No NVI   Lens 1-2+ Nuclear sclerosis, 1-2+ Cortical cataract 1-2+ Nuclear sclerosis, 1-2+ Cortical cataract   Anterior Vitreous Vitreous syneresis Vitreous syneresis         Fundus Exam       Right Left   Disc Pink and sharp, mild PPA Pink and sharp, mild PPA   C/D Ratio 0.4 0.5   Macula Flat, Blunted foveal reflex, focal MA/edema inferior and SN macula improved Flat, Blunted foveal reflex, focal MA/edema/CWS temporal macula--improving   Vessels Vascular attenuation, Telangiectasia, no NV Vascular attenuation, Telangiectasia, no NV   Periphery Attached, scattered MA/DBH greatest posteriorly Attached, scattered MA/DBH           IMAGING AND PROCEDURES  Imaging and Procedures for 11/16/2022  OCT, Retina - OU - Both Eyes       Right Eye Quality was good. Central Foveal Thickness: 357. Progression has improved. Findings include no SRF, abnormal foveal contour, intraretinal hyper-reflective material, intraretinal fluid, vitreomacular adhesion (Persistent cystic changes, mild interval improvement in IRF/IRHM inferior fovea and macula and SN mac).   Left Eye Quality was good. Central Foveal Thickness: 347. Progression has improved. Findings include no SRF, abnormal foveal contour, intraretinal hyper-reflective material, intraretinal fluid, vitreomacular adhesion (Interval improvement in +IRF/IRHM temporal fovea and  macula).   Notes *Images captured and stored on drive  Diagnosis / Impression:  +DME OU  OD: Persistent cystic changes, mild interval improvement in IRF/IRHM inferior fovea and macula and SN mac OS: Interval improvement  in +IRF/IRHM temporal  fovea and macula  Clinical management:  See below  Abbreviations: NFP - Normal foveal profile. CME -  cystoid macular edema. PED - pigment epithelial detachment. IRF - intraretinal fluid. SRF - subretinal fluid. EZ - ellipsoid zone. ERM - epiretinal membrane. ORA - outer retinal atrophy. ORT - outer retinal tubulation. SRHM - subretinal hyper-reflective material. IRHM - intraretinal hyper-reflective material      Intravitreal Injection, Pharmacologic Agent - OD - Right Eye       Time Out 11/16/2022. 10:25 AM. Confirmed correct patient, procedure, site, and patient consented.   Anesthesia Topical anesthesia was used. Anesthetic medications included Lidocaine 2%, Proparacaine 0.5%.   Procedure Preparation included 5% betadine to ocular surface, eyelid speculum. A supplied (32g) needle was used.   Injection: 1.25 mg Bevacizumab 1.25mg /0.72ml   Route: Intravitreal, Site: Right Eye   NDC: P3213405, Lot: [id number], Expiration date: 11/29/2022   Post-op Post injection exam found visual acuity of at least counting fingers. The patient tolerated the procedure well. There were no complications. The patient received written and verbal post procedure care education.      Intravitreal Injection, Pharmacologic Agent - OS - Left Eye       Time Out 11/16/2022. 10:25 AM. Confirmed correct patient, procedure, site, and patient consented.   Anesthesia Topical anesthesia was used. Anesthetic medications included Lidocaine 2%, Proparacaine 0.5%.   Procedure Preparation included 5% betadine to ocular surface, eyelid speculum. A (32g) needle was used.   Injection: 1.25 mg Bevacizumab 1.25mg /0.65ml   Route: Intravitreal, Site: Left Eye   NDC: P3213405, Lot: [id number], Expiration date: 01/25/2023   Post-op Post injection exam found visual acuity of at least counting fingers. The patient tolerated the procedure well. There were no complications. The patient received written and verbal post procedure care education.           ASSESSMENT/PLAN:   ICD-10-CM   1. Moderate nonproliferative  diabetic retinopathy of both eyes with macular edema associated with type 2 diabetes mellitus (HCC)  E11.3313 OCT, Retina - OU - Both Eyes    Intravitreal Injection, Pharmacologic Agent - OD - Right Eye    Intravitreal Injection, Pharmacologic Agent - OS - Left Eye    Bevacizumab (AVASTIN) SOLN 1.25 mg    Bevacizumab (AVASTIN) SOLN 1.25 mg    2. Current use of insulin (HCC)  Z79.4     3. Diabetes mellitus treated with oral medication (HCC)  E11.9    Z79.84     4. Essential hypertension  I10     5. Hypertensive retinopathy of both eyes  H35.033     6. Combined forms of age-related cataract of both eyes  H25.813      1-3. Moderate Non-proliferative diabetic retinopathy w/ DME, both eyes  - s/p IVA OD #1 (05.29.24), #2 (07.01.24)  - s/p IVA OS #1 (07.01.24) - Latest A1C 8.2 on 5.29.24 - BCVA OD 20/25 - stable, OS 20/30 from 20/25 - FA (05.29.24) + Mod NPDR OU, shows OD: Scattered leaking MA greatest inferior macula, no NV, OS; Scattered leaking MA greatest temporal macula, no NV - OCT shows +DME OU, OD; Persistent cystic changes, mild interval improvement in IRF/IRHM inferior fovea and macula and SN mac OS; Interval improvement  in +IRF/IRHM temporal  fovea and  macula - recommend IVA #3 OD and IVA OS #2  today, 07.29.24 - pt wishes to proceed - RBA of procedure discussed, questions answered  - IVA informed consent obtained and signed OU 5.29.24 - see procedure note - f/u in 4 wks -- DFE/OCT, possible injection   4,5. Hypertensive retinopathy OU - discussed importance of tight BP control - monitor  6. Mixed Cataract OU - The symptoms of cataract, surgical options, and treatments and risks were discussed with patient. - discussed diagnosis and progression - monitor  Ophthalmic Meds Ordered this visit:  Meds ordered this encounter  Medications   Bevacizumab (AVASTIN) SOLN 1.25 mg   Bevacizumab (AVASTIN) SOLN 1.25 mg     Return in about 4 weeks (around 12/14/2022) for f/u MOD  NPDR OU, DFE, OCT, Possible, IVA, OU.  There are no Patient Instructions on file for this visit.  Explained the diagnoses, plan, and follow up with the patient and they expressed understanding.  Patient expressed understanding of the importance of proper follow up care.   This document serves as a record of services personally performed by Karie Chimera, MD, PhD. It was created on their behalf by Annalee Genta, COMT. The creation of this record is the provider's dictation and/or activities during the visit.  Electronically signed by: Annalee Genta, COMT 11/16/22 11:39 AM  This document serves as a record of services personally performed by Karie Chimera, MD, PhD. It was created on their behalf by Gerilyn Nestle, COT an ophthalmic technician. The creation of this record is the provider's dictation and/or activities during the visit.    Electronically signed by:  Charlette Caffey, COT  11/16/22 11:39 AM  Karie Chimera, M.D., Ph.D. Diseases & Surgery of the Retina and Vitreous [facility] 11/16/2022  I have reviewed the above documentation for accuracy and completeness, and I agree with the above. Karie Chimera, M.D., Ph.D. 11/16/22 11:40 AM  Abbreviations: M myopia (nearsighted); A astigmatism; H hyperopia (farsighted); P presbyopia; Mrx spectacle prescription;  CTL contact lenses; OD right eye; OS left eye; OU both eyes  XT exotropia; ET esotropia; PEK punctate epithelial keratitis; PEE punctate epithelial erosions; DES dry eye syndrome; MGD meibomian gland dysfunction; ATs artificial tears; PFAT's preservative free artificial tears; NSC nuclear sclerotic cataract; PSC posterior subcapsular cataract; ERM epi-retinal membrane; PVD posterior vitreous detachment; RD retinal detachment; DM diabetes mellitus; DR diabetic retinopathy; NPDR non-proliferative diabetic retinopathy; PDR proliferative diabetic retinopathy; CSME clinically significant macular edema; DME  diabetic macular edema; dbh dot blot hemorrhages; CWS cotton wool spot; POAG primary open angle glaucoma; C/D cup-to-disc ratio; HVF humphrey visual field; GVF goldmann visual field; OCT optical coherence tomography; IOP intraocular pressure; BRVO Branch retinal vein occlusion; CRVO central retinal vein occlusion; CRAO central retinal artery occlusion; BRAO branch retinal artery occlusion; RT retinal tear; SB scleral buckle; PPV pars plana vitrectomy; VH Vitreous hemorrhage; PRP panretinal laser photocoagulation; IVK intravitreal kenalog; VMT vitreomacular traction; MH Macular hole;  NVD neovascularization of the disc; NVE neovascularization elsewhere; AREDS age related eye disease study; ARMD age related macular degeneration; POAG primary open angle glaucoma; EBMD epithelial/anterior basement membrane dystrophy; ACIOL anterior chamber intraocular lens; IOL intraocular lens; PCIOL posterior chamber intraocular lens; Phaco/IOL phacoemulsification with intraocular lens placement; PRK photorefractive keratectomy; LASIK laser assisted in situ keratomileusis; HTN hypertension; DM diabetes mellitus; COPD chronic obstructive pulmonary disease

## 2022-11-16 ENCOUNTER — Ambulatory Visit (INDEPENDENT_AMBULATORY_CARE_PROVIDER_SITE_OTHER): Payer: Medicare Other | Admitting: Ophthalmology

## 2022-11-16 ENCOUNTER — Encounter (INDEPENDENT_AMBULATORY_CARE_PROVIDER_SITE_OTHER): Payer: Self-pay | Admitting: Ophthalmology

## 2022-11-16 DIAGNOSIS — Z794 Long term (current) use of insulin: Secondary | ICD-10-CM

## 2022-11-16 DIAGNOSIS — H25813 Combined forms of age-related cataract, bilateral: Secondary | ICD-10-CM

## 2022-11-16 DIAGNOSIS — E119 Type 2 diabetes mellitus without complications: Secondary | ICD-10-CM

## 2022-11-16 DIAGNOSIS — I10 Essential (primary) hypertension: Secondary | ICD-10-CM | POA: Diagnosis not present

## 2022-11-16 DIAGNOSIS — H35033 Hypertensive retinopathy, bilateral: Secondary | ICD-10-CM

## 2022-11-16 DIAGNOSIS — E113313 Type 2 diabetes mellitus with moderate nonproliferative diabetic retinopathy with macular edema, bilateral: Secondary | ICD-10-CM | POA: Diagnosis not present

## 2022-11-16 MED ORDER — BEVACIZUMAB CHEMO INJECTION 1.25MG/0.05ML SYRINGE FOR KALEIDOSCOPE
1.2500 mg | INTRAVITREAL | Status: AC | PRN
Start: 2022-11-16 — End: 2022-11-16
  Administered 2022-11-16: 1.25 mg via INTRAVITREAL

## 2022-12-08 NOTE — Progress Notes (Signed)
[facility] - Clinic Note  12/14/2022   CHIEF COMPLAINT Patient presents for Retina Follow Up  HISTORY OF PRESENT ILLNESS: Ryan Jones is a 67 y.o. male who presents to the clinic today for:  HPI     Retina Follow Up   Patient presents with  Diabetic Retinopathy.  In both eyes.  This started 4 weeks ago.  Duration of 4 weeks.  Since onset it is stable.  I, the attending physician,  performed the HPI with the patient and updated documentation appropriately.        Comments   4 week NPDR OU and IVA OU pt is reporting no vision changes noticed he has some floaters denies any flashes his last reading was 160 this morning last A1c 6.9 July       Last edited by Rennis Chris, MD on 12/14/2022  1:37 PM.    Patient states he stopped Monjaro and just upped the Actos.   Referring physician: Olivia Canter, MD [address] [place],  [place] [address]  HISTORICAL INFORMATION:  Selected notes from the MEDICAL RECORD NUMBER Referred by Dr. [name] for DME OU LEE:  Ocular Hx- PMH-   CURRENT MEDICATIONS: No current outpatient medications on file. (Ophthalmic Drugs)   No current facility-administered medications for this visit. (Ophthalmic Drugs)   Current Outpatient Medications (Other)  Medication Sig   albuterol (PROVENTIL HFA;VENTOLIN HFA) 108 (90 Base) MCG/ACT inhaler Inhale 2 puffs into the lungs every 6 (six) hours as needed. For shortness of breath.   amLODipine (NORVASC) 10 MG tablet Take 10 mg by mouth in the morning.   carvedilol (COREG) 6.25 MG tablet Take 1 tablet (6.25 mg total) by mouth 2 (two) times daily.   EPINEPHrine 0.3 mg/0.3 mL IJ SOAJ injection Inject 0.3 mg into the muscle as needed for anaphylaxis.   furosemide (LASIX) 20 MG tablet Take 1 tablet (20 mg total) by mouth daily. (Patient not taking: Reported on 08/20/2021)   glipiZIDE (GLUCOTROL) 5 MG tablet Take 5 mg by mouth in the morning.   hydrochlorothiazide (HYDRODIURIL)  25 MG tablet Take 25 mg by mouth in the morning.   HYDROcodone-acetaminophen (NORCO/VICODIN) 5-325 MG tablet Take 1 tablet by mouth every 4 (four) hours as needed.   ibuprofen (ADVIL,MOTRIN) 800 MG tablet Take 800 mg by mouth every 8 (eight) hours as needed (for pain.).    indomethacin (INDOCIN) 50 MG capsule TAKE 1 CAPSULE BY MOUTH THREE TIMES DAILY AS NEEDED FOR MODERATE PAIN (GOUT)   losartan (COZAAR) 100 MG tablet TAKE 1 TABLET BY MOUTH EVERY DAY   metFORMIN (GLUCOPHAGE) 1000 MG tablet Take 1 tablet (1,000 mg total) by mouth 2 (two) times daily with a meal.   mometasone (NASONEX) 50 MCG/ACT nasal spray 2 sprays each nares once daily (Patient taking differently: Place 2 sprays into the nose daily as needed (for allergies.).)   Multiple Vitamin (MULTIVITAMIN WITH MINERALS) TABS tablet Take 1 tablet by mouth in the morning.   omeprazole (PRILOSEC) 40 MG capsule Take 40 mg by mouth daily.   OZEMPIC, 2 MG/DOSE, 8 MG/3ML SOPN Inject 3 mg into the skin every Saturday.   pioglitazone (ACTOS) 30 MG tablet Take 30 mg by mouth daily at 12 noon.    pravastatin (PRAVACHOL) 20 MG tablet Take 20 mg by mouth in the morning.   tamsulosin (FLOMAX) 0.4 MG CAPS capsule Take 0.4 mg by mouth every evening.   TRESIBA FLEXTOUCH 200 UNIT/ML FlexTouch Pen Inject  35 Units into the skin in the morning.   UNABLE TO FIND Inject 1 Dose as directed every 14 (fourteen) days. Allergy shots [place] dr Madie Reno   No current facility-administered medications for this visit. (Other)   REVIEW OF SYSTEMS: ROS   Positive for: Endocrine, Cardiovascular, Eyes Last edited by Etheleen Mayhew, COT on 12/14/2022  9:33 AM.     ALLERGIES Allergies  Allergen Reactions   Augmentin [Amoxicillin-Pot Clavulanate] Other (See Comments)    Thrush and yeast infection Has patient had a PCN reaction causing immediate rash, facial/tongue/throat swelling, SOB or lightheadedness with hypotension: No Has patient had a PCN reaction causing  severe rash involving mucus membranes or skin necrosis: No Has patient had a PCN reaction that required hospitalization: No Has patient had a PCN reaction occurring within the last 10 years: No If all of the above answers are "NO", then may proceed with Cephalosporin use.     Codeine Itching   PAST MEDICAL HISTORY Past Medical History:  Diagnosis Date   Arthritis    Atrial flutter (HCC) 02/26/2011   s/p RFCA 02/2011   Bronchitis    h/o recurrent bronchitis; "usually get it 1-2X/wy; last time 12/2010"   Cancer [facility]) 2011   right ear,basal   Constipation    Diabetes mellitus    "pills"   Diarrhea    GERD (gastroesophageal reflux disease)    Gout    "get it in my hands and feet"   Headache    sinus headache   Hemorrhoids    Hyperlipidemia    Hypertension    Kidney stone    1999   Seasonal allergies 02/26/2011   "I take an allergy shot q week"   Sleep apnea    no cpap used much since nasal surgery yrs ago   Past Surgical History:  Procedure Laterality Date   ATRIAL FLUTTER ABLATION N/A 02/26/2011   Procedure: ATRIAL FLUTTER ABLATION;  Surgeon: Duke Salvia, MD;  Location[facility] CATH LAB;  Service: Cardiovascular;  Laterality: N/A;   BACK SURGERY  before 1995 and in 1995;    "ruptured disc repaired; lower back"   CARDIAC ELECTROPHYSIOLOGY STUDY AND ABLATION  02/26/2011   for atrial fib   COLONOSCOPY WITH PROPOFOL N/A 01/18/2015   Procedure: COLONOSCOPY WITH PROPOFOL;  Surgeon: Jeani Hawking, MD;  Location: [facility] ENDOSCOPY;  Service: Endoscopy;  Laterality: N/A;   COLONOSCOPY WITH PROPOFOL N/A 02/04/2018   Procedure: COLONOSCOPY WITH PROPOFOL;  Surgeon: Jeani Hawking, MD;  Location: [facility] ENDOSCOPY;  Service: Endoscopy;  Laterality: N/A;   COLONOSCOPY WITH PROPOFOL N/A 08/22/2021   Procedure: COLONOSCOPY WITH PROPOFOL;  Surgeon: Jeani Hawking, MD;  Location: [facility] ENDOSCOPY;  Service: Endoscopy;  Laterality: N/A;   ESOPHAGOGASTRODUODENOSCOPY (EGD) WITH PROPOFOL N/A 08/22/2021   Procedure:  ESOPHAGOGASTRODUODENOSCOPY (EGD) WITH PROPOFOL;  Surgeon: Jeani Hawking, MD;  Location: [facility] ENDOSCOPY;  Service: Endoscopy;  Laterality: N/A;   LUMBAR DISC SURGERY  ~ 1993   "put foam in back; in place of disc"   LUMBAR DISC SURGERY  04/1993   "replaced gel foam that had blown out"   nasal septum surgery to open up sinuses  yrs ago   dr Emeline Darling   POLYPECTOMY  02/04/2018   Procedure: POLYPECTOMY;  Surgeon: Jeani Hawking, MD;  Location: [facility] ENDOSCOPY;  Service: Endoscopy;;   POLYPECTOMY  08/22/2021   Procedure: POLYPECTOMY;  Surgeon: Jeani Hawking, MD;  Location: [facility] ENDOSCOPY;  Service: Endoscopy;;   SKIN CANCER EXCISION  2011   right ear   squamous  cell area removed from right arm  6 weeks ago   FAMILY HISTORY Family History  Problem Relation Age of Onset   Heart attack Father    SOCIAL HISTORY Social History   Tobacco Use   Smoking status: Never   Smokeless tobacco: Current    Types: Snuff   Tobacco comments:    "stopped cigars ~ 2002"  Vaping Use   Vaping status: Never Used  Substance Use Topics   Alcohol use: No   Drug use: No       OPHTHALMIC EXAM:  Base Eye Exam     Visual Acuity (Snellen - Linear)       Right Left   Dist [place] 20/30 20/30   Dist ph [place] NI NI         Tonometry (Tonopen, 9:37 AM)       Right Left   Pressure 15 15         Pupils       Pupils Dark Light Shape React APD   Right PERRL 4 3 Round Brisk None   Left PERRL 4 3 Round Brisk None         Visual Fields       Left Right    Full Full         Extraocular Movement       Right Left    Full, Ortho Full, Ortho         Neuro/Psych     Oriented x3: Yes   Mood/Affect: Normal         Dilation     Both eyes: 2.5% Phenylephrine @ 9:37 AM           Slit Lamp and Fundus Exam     External Exam       Right Left   External Normal Normal         Slit Lamp Exam       Right Left   Lids/Lashes Dermatochalasis - upper lid Dermatochalasis - upper lid    Conjunctiva/Sclera Nasal and temporal Pinguecula Nasal and temporal Pinguecula   Cornea Debris in tear film Debris in tear film   Anterior Chamber Deep and clear Deep and clear   Iris Round and dilated, No NVI Round and dilated, No NVI   Lens 1-2+ Nuclear sclerosis, 1-2+ Cortical cataract 1-2+ Nuclear sclerosis, 1-2+ Cortical cataract   Anterior Vitreous Vitreous syneresis Vitreous syneresis         Fundus Exam       Right Left   Disc Pink and sharp, mild PPA Pink and sharp, mild PPA   C/D Ratio 0.4 0.5   Macula Flat, Blunted foveal reflex, focal MA/edema inferior and SN macula -increased Flat, Blunted foveal reflex, focal MA/edema/CWS temporal macula--improved   Vessels Vascular attenuation, Telangiectasia, no NV Vascular attenuation, Telangiectasia, no NV   Periphery Attached, scattered MA/DBH greatest posteriorly, focal DBH outside ST arcades Attached, scattered MA/DBH           IMAGING AND PROCEDURES  Imaging and Procedures for 12/14/2022  OCT, Retina - OU - Both Eyes       Right Eye Quality was good. Central Foveal Thickness: 375. Progression has worsened. Findings include no SRF, abnormal foveal contour, intraretinal hyper-reflective material, intraretinal fluid, vitreomacular adhesion (Persistent cystic changes, mild interval increase in IRF/IRHM inferior fovea and macula and SN mac).   Left Eye Quality was good. Central Foveal Thickness: 345. Progression has improved. Findings include no SRF, abnormal foveal contour, intraretinal hyper-reflective material,  intraretinal fluid, vitreomacular adhesion (Mild Interval improvement in +IRF/IRHM temporal fovea and macula).   Notes *Images captured and stored on drive  Diagnosis / Impression:  +DME OU  OD: Persistent cystic changes, mild interval increase in IRF/IRHM inferior fovea and macula and SN mac OS: mild Interval improvement  in +IRF/IRHM temporal  fovea and macula  Clinical management:  See below  Abbreviations:  NFP - Normal foveal profile. CME - cystoid macular edema. PED - pigment epithelial detachment. IRF - intraretinal fluid. SRF - subretinal fluid. EZ - ellipsoid zone. ERM - epiretinal membrane. ORA - outer retinal atrophy. ORT - outer retinal tubulation. SRHM - subretinal hyper-reflective material. IRHM - intraretinal hyper-reflective material      Intravitreal Injection, Pharmacologic Agent - OD - Right Eye       Time Out 12/14/2022. 10:28 AM. Confirmed correct patient, procedure, site, and patient consented.   Anesthesia Topical anesthesia was used. Anesthetic medications included Lidocaine 2%, Proparacaine 0.5%.   Procedure Preparation included 5% betadine to ocular surface, eyelid speculum. A supplied (32g) needle was used.   Injection: 1.25 mg Bevacizumab 1.25mg /0.76ml   Route: Intravitreal, Site: Right Eye   NDC: P3213405, Lot: [id number] , Expiration date: 12/26/2022   Post-op Post injection exam found visual acuity of at least counting fingers. The patient tolerated the procedure well. There were no complications. The patient received written and verbal post procedure care education.      Intravitreal Injection, Pharmacologic Agent - OS - Left Eye       Time Out 12/14/2022. 10:28 AM. Confirmed correct patient, procedure, site, and patient consented.   Anesthesia Topical anesthesia was used. Anesthetic medications included Lidocaine 2%, Proparacaine 0.5%.   Procedure Preparation included 5% betadine to ocular surface, eyelid speculum. A supplied (32g) needle was used.   Injection: 1.25 mg Bevacizumab 1.25mg /0.9ml   Route: Intravitreal, Site: Left Eye   NDC: P3213405, Lot: [id number], Expiration date: 01/18/2023   Post-op Post injection exam found visual acuity of at least counting fingers. The patient tolerated the procedure well. There were no complications. The patient received written and verbal post procedure care education.           ASSESSMENT/PLAN:    ICD-10-CM   1. Moderate nonproliferative diabetic retinopathy of both eyes with macular edema associated with type 2 diabetes mellitus (HCC)  E11.3313 OCT, Retina - OU - Both Eyes    Intravitreal Injection, Pharmacologic Agent - OD - Right Eye    Intravitreal Injection, Pharmacologic Agent - OS - Left Eye    Bevacizumab (AVASTIN) SOLN 1.25 mg    Bevacizumab (AVASTIN) SOLN 1.25 mg    2. Current use of insulin (HCC)  Z79.4     3. Diabetes mellitus treated with oral medication (HCC)  E11.9    Z79.84     4. Essential hypertension  I10     5. Hypertensive retinopathy of both eyes  H35.033     6. Combined forms of age-related cataract of both eyes  H25.813      1-3. Moderate Non-proliferative diabetic retinopathy w/ DME, both eyes  - s/p IVA OD #1 (05.29.24), #2 (07.01.24), #3 (07.29.24)  - s/p IVA OS #1 (07.01.24), #2 (07.29.24) - Latest A1C 8.2 on 5.29.24 - BCVA OU 20/30 - FA (05.29.24) + Mod NPDR OU, shows OD: Scattered leaking MA greatest inferior macula, no NV, OS; Scattered leaking MA greatest temporal macula, no NV - OCT shows +DME OU, OD; Persistent cystic changes, mild interval increase in IRF/IRHM inferior fovea and macula and  SN mac OS; mild interval improvement  in +IRF/IRHM temporal  fovea and macula at 4 wks **discussed decreased efficacy / resistance to Avastin and potential benefit of switching medication**   - will work on [employer] approval for next visit - recommend IVA OD #4 and IVA OS #3  today, 08.26.24 - pt wishes to proceed - RBA of procedure discussed, questions answered  - IVA informed consent obtained and signed OU 5.29.24 - see procedure note - f/u in 4 wks -- DFE/OCT, possible injection   4,5. Hypertensive retinopathy OU - discussed importance of tight BP control - monitor  6. Mixed Cataract OU - The symptoms of cataract, surgical options, and treatments and risks were discussed with patient. - discussed diagnosis and progression - monitor  Ophthalmic  Meds Ordered this visit:  Meds ordered this encounter  Medications   Bevacizumab (AVASTIN) SOLN 1.25 mg   Bevacizumab (AVASTIN) SOLN 1.25 mg     Return in about 4 weeks (around 01/11/2023) for f/u NPDR OU, DFE, OCT, Possible, IVA, OU.  There are no Patient Instructions on file for this visit.  Explained the diagnoses, plan, and follow up with the patient and they expressed understanding.  Patient expressed understanding of the importance of proper follow up care.   This document serves as a record of services personally performed by Karie Chimera, MD, PhD. It was created on their behalf by Annalee Genta, COMT. The creation of this record is the provider's dictation and/or activities during the visit.  Electronically signed by: Annalee Genta, COMT 12/15/22 11:46 PM  This document serves as a record of services personally performed by Karie Chimera, MD, PhD. It was created on their behalf by Laurey Morale, COT an ophthalmic technician. The creation of this record is the provider's dictation and/or activities during the visit.    Electronically signed by:  Charlette Caffey, COT  12/15/22 11:46 PM  Karie Chimera, M.D., Ph.D. Diseases & Surgery of the Retina and Vitreous [facility] 12/14/2022  I have reviewed the above documentation for accuracy and completeness, and I agree with the above. Karie Chimera, M.D., Ph.D. 12/15/22 11:48 PM   Abbreviations: M myopia (nearsighted); A astigmatism; H hyperopia (farsighted); P presbyopia; Mrx spectacle prescription;  CTL contact lenses; OD right eye; OS left eye; OU both eyes  XT exotropia; ET esotropia; PEK punctate epithelial keratitis; PEE punctate epithelial erosions; DES dry eye syndrome; MGD meibomian gland dysfunction; ATs artificial tears; PFAT's preservative free artificial tears; NSC nuclear sclerotic cataract; PSC posterior subcapsular cataract; ERM epi-retinal membrane; PVD posterior vitreous detachment;  RD retinal detachment; DM diabetes mellitus; DR diabetic retinopathy; NPDR non-proliferative diabetic retinopathy; PDR proliferative diabetic retinopathy; CSME clinically significant macular edema; DME diabetic macular edema; dbh dot blot hemorrhages; CWS cotton wool spot; POAG primary open angle glaucoma; C/D cup-to-disc ratio; HVF humphrey visual field; GVF goldmann visual field; OCT optical coherence tomography; IOP intraocular pressure; BRVO Branch retinal vein occlusion; CRVO central retinal vein occlusion; CRAO central retinal artery occlusion; BRAO branch retinal artery occlusion; RT retinal tear; SB scleral buckle; PPV pars plana vitrectomy; VH Vitreous hemorrhage; PRP panretinal laser photocoagulation; IVK intravitreal kenalog; VMT vitreomacular traction; MH Macular hole;  NVD neovascularization of the disc; NVE neovascularization elsewhere; AREDS age related eye disease study; ARMD age related macular degeneration; POAG primary open angle glaucoma; EBMD epithelial/anterior basement membrane dystrophy; ACIOL anterior chamber intraocular lens; IOL intraocular lens; PCIOL posterior chamber intraocular lens; Phaco/IOL phacoemulsification with intraocular lens placement; PRK  photorefractive keratectomy; LASIK laser assisted in situ keratomileusis; HTN hypertension; DM diabetes mellitus; COPD chronic obstructive pulmonary disease

## 2022-12-14 ENCOUNTER — Ambulatory Visit (INDEPENDENT_AMBULATORY_CARE_PROVIDER_SITE_OTHER): Payer: Federal, State, Local not specified - PPO | Admitting: Ophthalmology

## 2022-12-14 ENCOUNTER — Encounter (INDEPENDENT_AMBULATORY_CARE_PROVIDER_SITE_OTHER): Payer: Self-pay | Admitting: Ophthalmology

## 2022-12-14 DIAGNOSIS — E113313 Type 2 diabetes mellitus with moderate nonproliferative diabetic retinopathy with macular edema, bilateral: Secondary | ICD-10-CM

## 2022-12-14 DIAGNOSIS — I10 Essential (primary) hypertension: Secondary | ICD-10-CM | POA: Diagnosis not present

## 2022-12-14 DIAGNOSIS — H35033 Hypertensive retinopathy, bilateral: Secondary | ICD-10-CM

## 2022-12-14 DIAGNOSIS — Z794 Long term (current) use of insulin: Secondary | ICD-10-CM

## 2022-12-14 DIAGNOSIS — Z7984 Long term (current) use of oral hypoglycemic drugs: Secondary | ICD-10-CM

## 2022-12-14 DIAGNOSIS — H25813 Combined forms of age-related cataract, bilateral: Secondary | ICD-10-CM

## 2022-12-14 DIAGNOSIS — E119 Type 2 diabetes mellitus without complications: Secondary | ICD-10-CM

## 2022-12-14 MED ORDER — BEVACIZUMAB CHEMO INJECTION 1.25MG/0.05ML SYRINGE FOR KALEIDOSCOPE
1.2500 mg | INTRAVITREAL | Status: AC | PRN
Start: 2022-12-14 — End: 2022-12-14
  Administered 2022-12-14: 1.25 mg via INTRAVITREAL

## 2023-01-13 ENCOUNTER — Encounter (INDEPENDENT_AMBULATORY_CARE_PROVIDER_SITE_OTHER): Payer: Federal, State, Local not specified - PPO | Admitting: Ophthalmology

## 2023-07-22 IMAGING — US US ABDOMEN LIMITED
1 series · 14 of 25 positions shown · non-contrast
Comparison: None.

CLINICAL DATA: Patient with elevated LFTs.

EXAM:
ULTRASOUND ABDOMEN LIMITED RIGHT UPPER QUADRANT

[Series 1: us abdomen limited · 0.31mm/px · 14 of 42 slices shown]
[im 1/42]
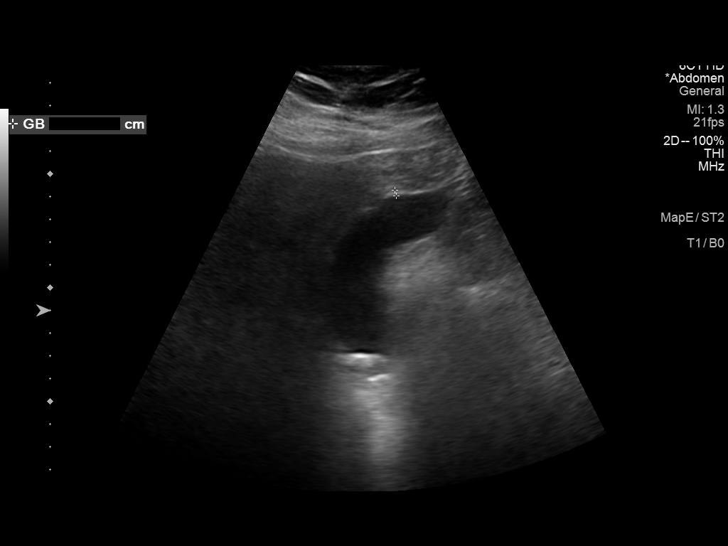
[im 4/42]
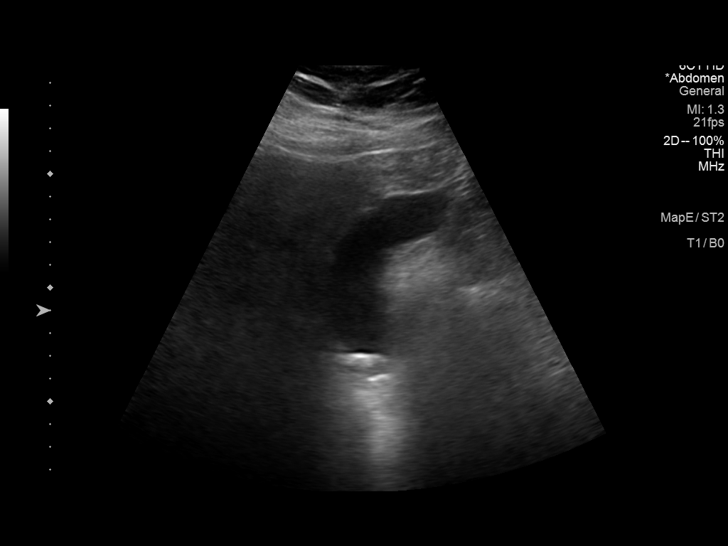
[im 7/42]
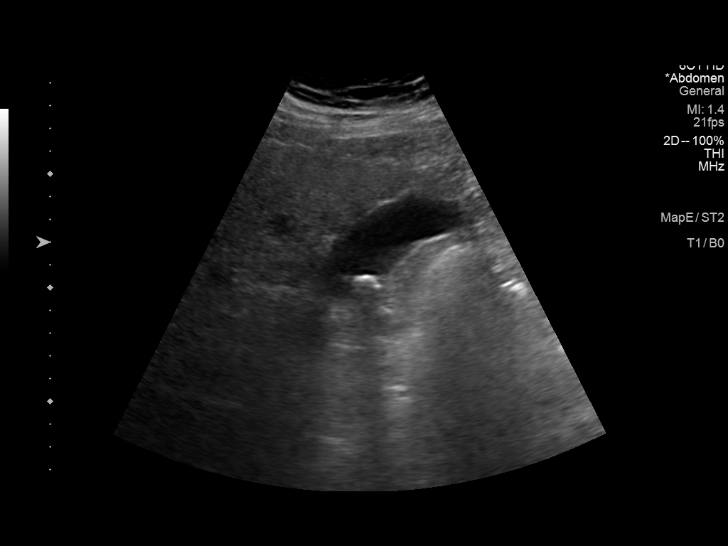
[im 11/42]
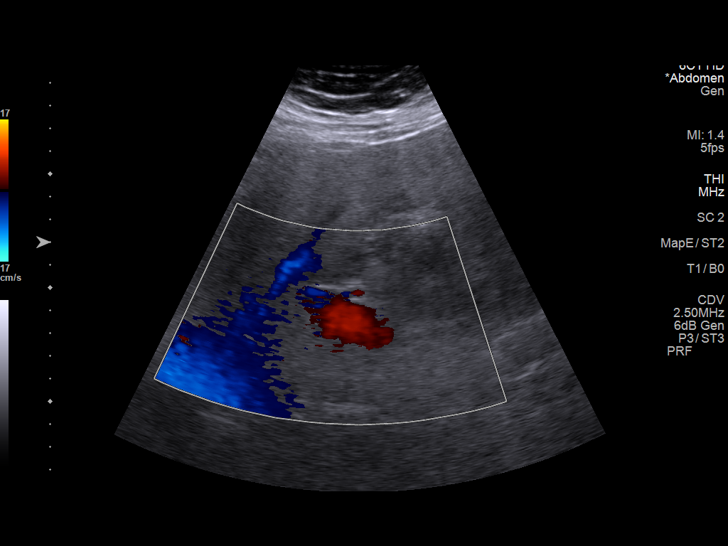
[im 14/42]
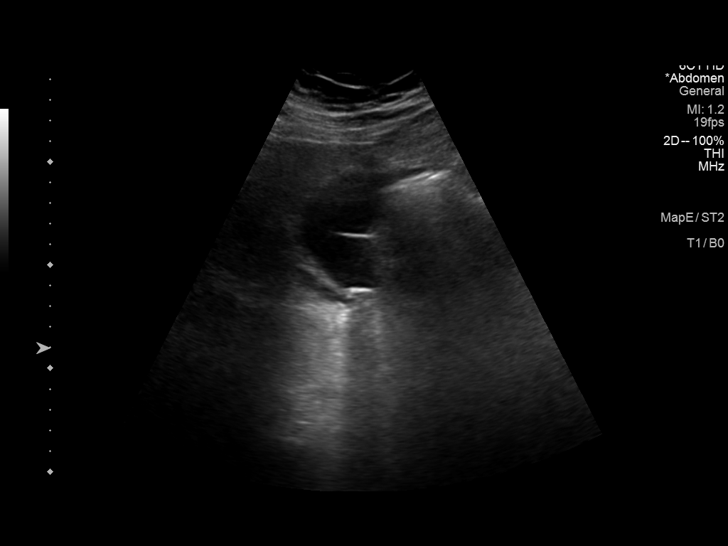
[im 16/42]
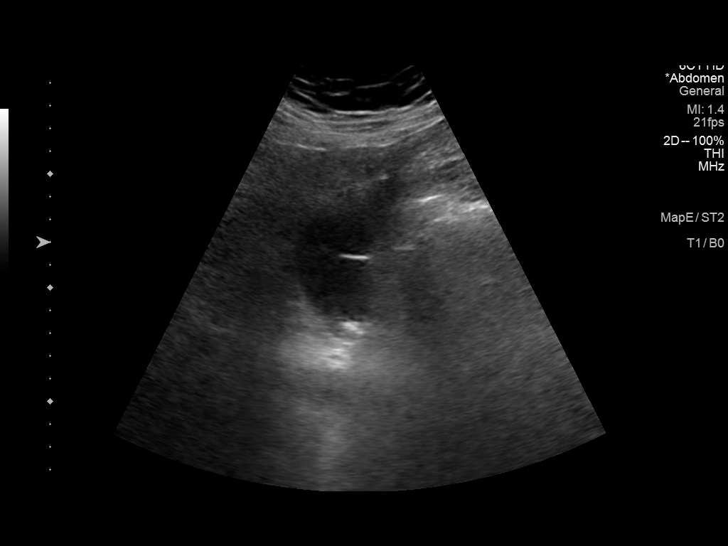
[im 19/42]
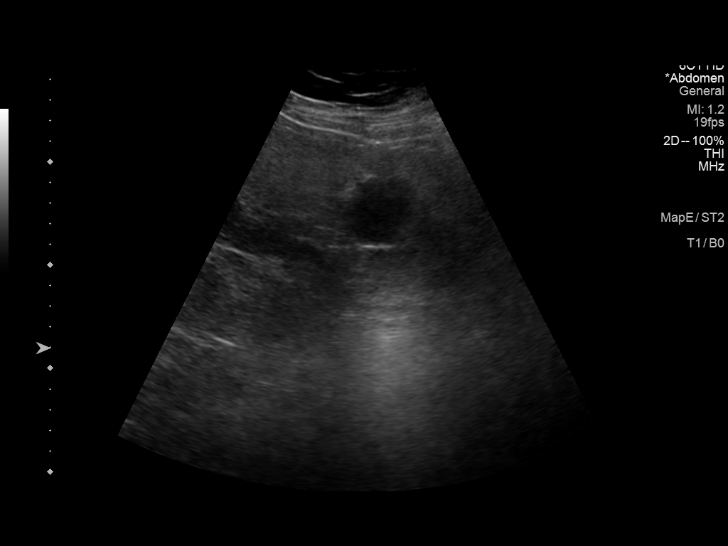
[im 23/42]
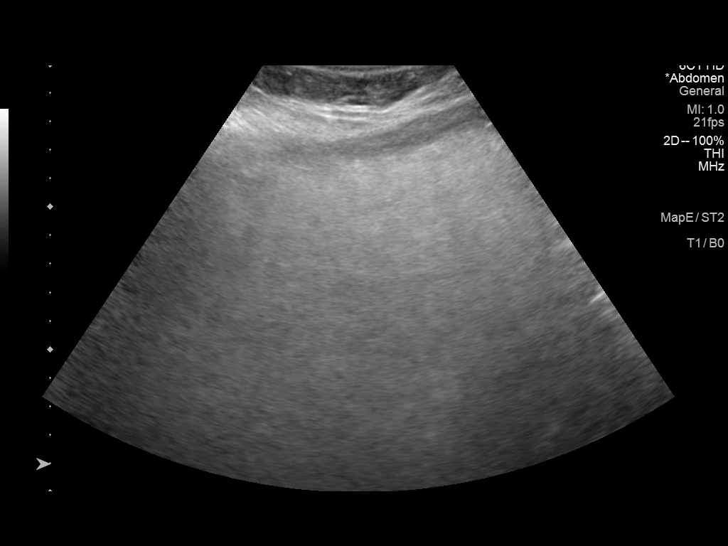
[im 26/42]
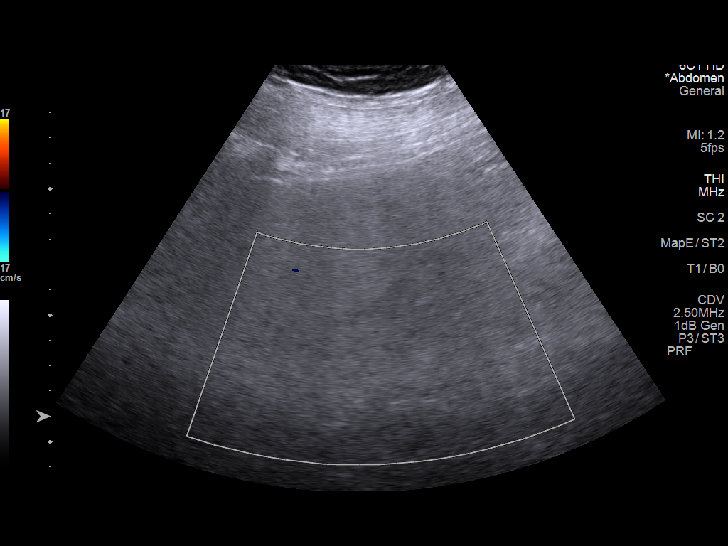
[im 28/42]
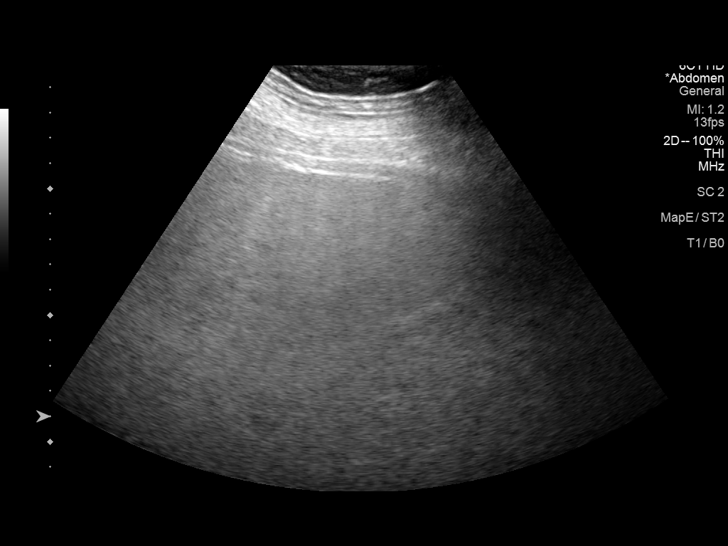
[im 31/42]
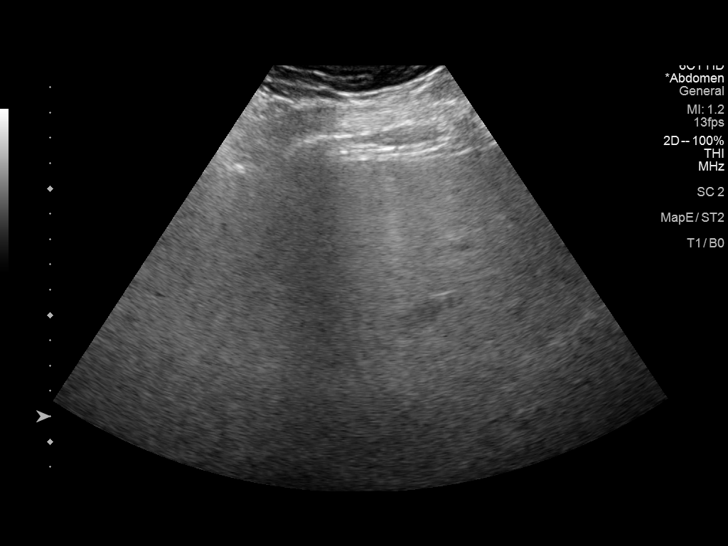
[im 35/42]
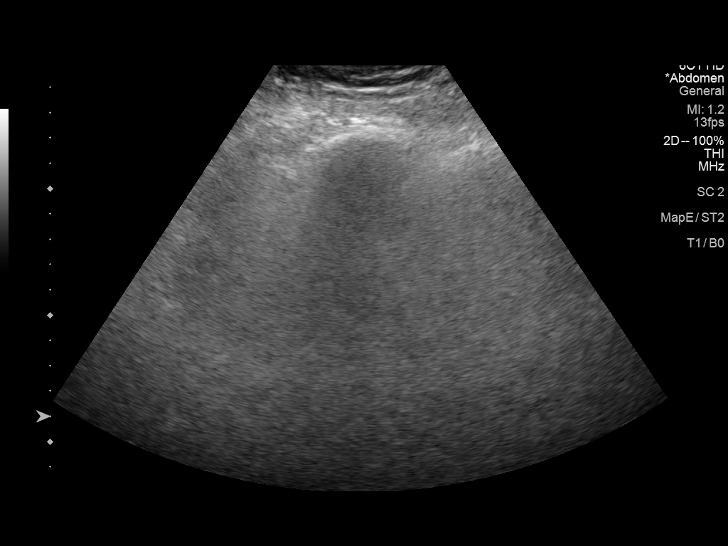
[im 38/42]
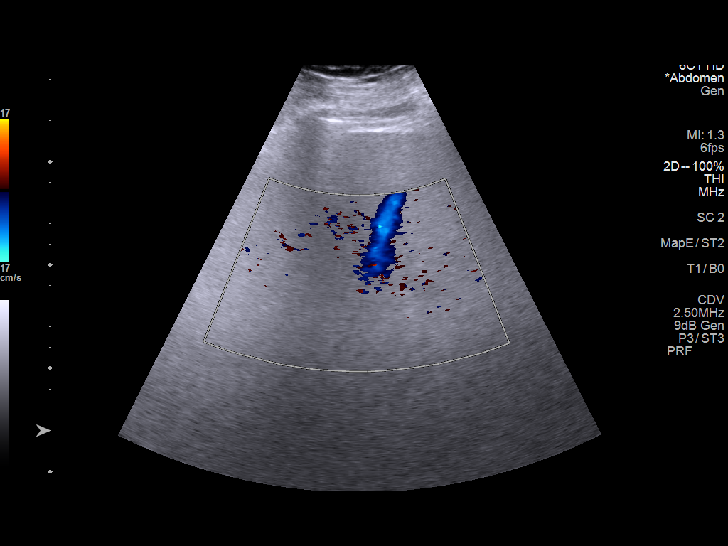
[im 42/42]
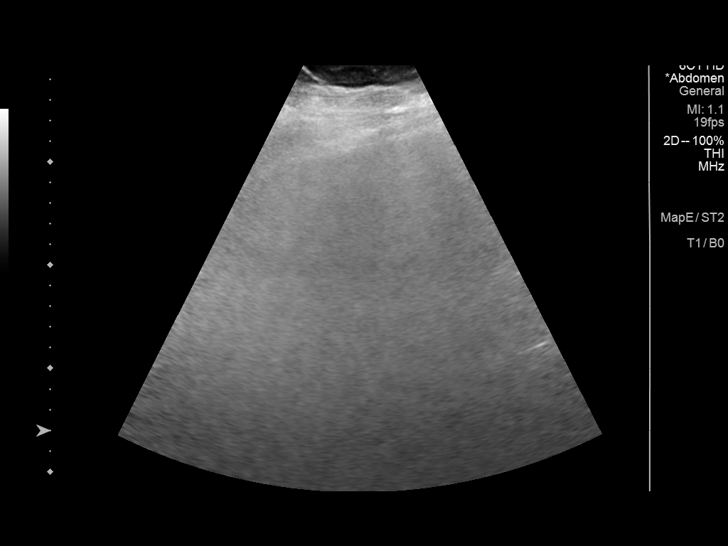

[14 of 25 positions shown; findings below may reference images not displayed]

FINDINGS: Gallbladder:

Poorly visualized. There is a stone within the gallbladder lumen. No
gallbladder wall thickening or pericholecystic fluid. Negative
sonographic Murphy sign.

Common bile duct:

Poorly visualized.

Diameter: 3 mm

Liver:

Diffusely increased in echogenicity. No focal lesion. Portal vein is
patent on color Doppler imaging with normal direction of blood flow
towards the liver.

Other: None.
IMPRESSION: Stone in the gallbladder lumen. No secondary signs to suggest
cholecystitis.

Hepatic steatosis.

## 2023-08-19 ENCOUNTER — Telehealth: Payer: Self-pay | Admitting: Cardiology

## 2023-08-19 NOTE — Telephone Encounter (Signed)
 Wife Ammon Bales) stated patient wants a provider from Dr. Audery Blazing to Dr. Veryl Gottron.

## 2023-08-30 NOTE — Progress Notes (Signed)
 [facility] - Clinic Note  09/01/2023   CHIEF COMPLAINT Patient presents for Retina Follow Up  HISTORY OF PRESENT ILLNESS: Ryan Jones is a 68 y.o. male who presents to the clinic today for:  HPI     Retina Follow Up   Patient presents with  Diabetic Retinopathy.  In both eyes.  This started 9 months ago.  Duration of 9 months.  Since onset it is gradually worsening.  I, the attending physician,  performed the HPI with the patient and updated documentation appropriately.        Comments   9 month retina follow up NPDR and IVA OU pt is reporting decreased vision more floaters denies flashes last reading 94 this am last A1C 9 02/10/23.      Last edited by Ronelle Coffee, MD on 09/01/2023 12:27 PM.    Pt states he hasn't been here because his wife had to have neck surgery and was focused on that. VA has been intermittently blurry but feels it could be allergies.   Referring physician: Sidra Dredge, MD [address] [place],  [place] [address]  HISTORICAL INFORMATION:  Selected notes from the MEDICAL RECORD NUMBER Referred by Dr. Diedre Fox for DME OU LEE:  Ocular Hx- PMH-   CURRENT MEDICATIONS: No current outpatient medications on file. (Ophthalmic Drugs)   No current facility-administered medications for this visit. (Ophthalmic Drugs)   Current Outpatient Medications (Other)  Medication Sig   albuterol  (PROVENTIL  HFA;VENTOLIN  HFA) 108 (90 Base) MCG/ACT inhaler Inhale 2 puffs into the lungs every 6 (six) hours as needed. For shortness of breath.   amLODipine  (NORVASC ) 10 MG tablet Take 10 mg by mouth in the morning.   carvedilol  (COREG ) 6.25 MG tablet Take 1 tablet (6.25 mg total) by mouth 2 (two) times daily.   EPINEPHrine  0.3 mg/0.3 mL IJ SOAJ injection Inject 0.3 mg into the muscle as needed for anaphylaxis.   furosemide  (LASIX ) 20 MG tablet Take 1 tablet (20 mg total) by mouth daily. (Patient not taking: Reported on 08/20/2021)    glipiZIDE  (GLUCOTROL ) 5 MG tablet Take 5 mg by mouth in the morning.   hydrochlorothiazide  (HYDRODIURIL ) 25 MG tablet Take 25 mg by mouth in the morning.   HYDROcodone -acetaminophen  (NORCO/VICODIN) 5-325 MG tablet Take 1 tablet by mouth every 4 (four) hours as needed.   ibuprofen (ADVIL,MOTRIN) 800 MG tablet Take 800 mg by mouth every 8 (eight) hours as needed (for pain.).    indomethacin  (INDOCIN ) 50 MG capsule TAKE 1 CAPSULE BY MOUTH THREE TIMES DAILY AS NEEDED FOR MODERATE PAIN (GOUT)   losartan  (COZAAR ) 100 MG tablet TAKE 1 TABLET BY MOUTH EVERY DAY   metFORMIN  (GLUCOPHAGE ) 1000 MG tablet Take 1 tablet (1,000 mg total) by mouth 2 (two) times daily with a meal.   mometasone  (NASONEX ) 50 MCG/ACT nasal spray 2 sprays each nares once daily (Patient taking differently: Place 2 sprays into the nose daily as needed (for allergies.).)   Multiple Vitamin (MULTIVITAMIN WITH MINERALS) TABS tablet Take 1 tablet by mouth in the morning.   omeprazole  (PRILOSEC) 40 MG capsule Take 40 mg by mouth daily.   OZEMPIC, 2 MG/DOSE, 8 MG/3ML SOPN Inject 3 mg into the skin every Saturday. (Patient not taking: Reported on 09/01/2023)   pioglitazone  (ACTOS ) 30 MG tablet Take 30 mg by mouth daily at 12 noon.    pravastatin (PRAVACHOL) 20 MG tablet Take 20 mg by mouth in the morning.   tamsulosin (FLOMAX)  0.4 MG CAPS capsule Take 0.4 mg by mouth every evening.   TRESIBA FLEXTOUCH 200 UNIT/ML FlexTouch Pen Inject 35 Units into the skin in the morning.   UNABLE TO FIND Inject 1 Dose as directed every 14 (fourteen) days. Allergy shots [place] dr Oscar Blazing   No current facility-administered medications for this visit. (Other)   REVIEW OF SYSTEMS: ROS   Positive for: Endocrine, Cardiovascular, Eyes Last edited by Alise Appl, COT on 09/01/2023  9:31 AM.     ALLERGIES Allergies  Allergen Reactions   Augmentin  [Amoxicillin -Pot Clavulanate] Other (See Comments)    Thrush and yeast infection Has patient had a  PCN reaction causing immediate rash, facial/tongue/throat swelling, SOB or lightheadedness with hypotension: No Has patient had a PCN reaction causing severe rash involving mucus membranes or skin necrosis: No Has patient had a PCN reaction that required hospitalization: No Has patient had a PCN reaction occurring within the last 10 years: No If all of the above answers are "NO", then may proceed with Cephalosporin use.     Codeine Itching   PAST MEDICAL HISTORY Past Medical History:  Diagnosis Date   Arthritis    Atrial flutter (HCC) 02/26/2011   s/p RFCA 02/2011   Bronchitis    h/o recurrent bronchitis; "usually get it 1-2X/wy; last time 12/2010"   Cancer [facility]) 2011   right ear,basal   Constipation    Diabetes mellitus    "pills"   Diarrhea    GERD (gastroesophageal reflux disease)    Gout    "get it in my hands and feet"   Headache    sinus headache   Hemorrhoids    Hyperlipidemia    Hypertension    Kidney stone    1999   Seasonal allergies 02/26/2011   "I take an allergy shot q week"   Sleep apnea    no cpap used much since nasal surgery yrs ago   Past Surgical History:  Procedure Laterality Date   ATRIAL FLUTTER ABLATION N/A 02/26/2011   Procedure: ATRIAL FLUTTER ABLATION;  Surgeon: Verona Goodwill, MD;  Location: [facility] CATH LAB;  Service: Cardiovascular;  Laterality: N/A;   BACK SURGERY  before 1995 and in 1995;    "ruptured disc repaired; lower back"   CARDIAC ELECTROPHYSIOLOGY STUDY AND ABLATION  02/26/2011   for atrial fib   COLONOSCOPY WITH PROPOFOL  N/A 01/18/2015   Procedure: COLONOSCOPY WITH PROPOFOL ;  Surgeon: Alvis Jourdain, MD;  Location: [facility] ENDOS[facility];  Service: Endoscopy;  Laterality: N/A;   COLONOSCOPY WITH PROPOFOL  N/A 02/04/2018   Procedure: COLONOSCOPY WITH PROPOFOL ;  Surgeon: Alvis Jourdain, MD;  Location: [facility];  Service: Endoscopy;  Laterality: N/A;   COLONOSCOPY WITH PROPOFOL  N/A 08/22/2021   Procedure: COLONOSCOPY WITH PROPOFOL ;  Surgeon: Alvis Jourdain, MD;  Location: [facility];  Service: Endoscopy;  Laterality: N/A;   ESOPHAGOGASTRODUODENOSCOPY (EGD) WITH PROPOFOL  N/A 08/22/2021   Procedure: ESOPHAGOGASTRODUODENOSCOPY (EGD) WITH PROPOFOL ;  Surgeon: Alvis Jourdain, MD;  Location: [facility];  Service: Endoscopy;  Laterality: N/A;   LUMBAR DISC SURGERY  ~ 1993   "put foam in back; in place of disc"   LUMBAR DISC SURGERY  04/1993   "replaced gel foam that had blown out"   nasal septum surgery to open up sinuses  yrs ago   dr Johnathan Myron   POLYPECTOMY  02/04/2018   Procedure: POLYPECTOMY;  Surgeon: Alvis Jourdain, MD;  Location: [facility];  Service: Endoscopy;;   POLYPECTOMY  08/22/2021   Procedure: POLYPECTOMY;  Surgeon: Alvis Jourdain, MD;  Location: [facility];  Service: Endoscopy;;   SKIN CANCER EXCISION  2011   right ear   squamous cell area removed from right arm  6 weeks ago   FAMILY HISTORY Family History  Problem Relation Age of Onset   Heart attack Father    SOCIAL HISTORY Social History   Tobacco Use   Smoking status: Never   Smokeless tobacco: Current    Types: Snuff   Tobacco comments:    "stopped cigars ~ 2002"  Vaping Use   Vaping status: Never Used  Substance Use Topics   Alcohol use: No   Drug use: No       OPHTHALMIC EXAM:  Base Eye Exam     Visual Acuity (Snellen - Linear)       Right Left   Dist [place] 20/30 20/30 -2   Dist ph [place] 20/25 -2 20/25 -2         Tonometry (Tonopen, 9:37 AM)       Right Left   Pressure 12 16         Pupils       Pupils Dark Light Shape React APD   Right PERRL 4 3 Round Brisk None   Left PERRL 4 3 Round Brisk None         Visual Fields       Left Right    Full Full         Extraocular Movement       Right Left    Full, Ortho Full, Ortho         Neuro/Psych     Oriented x3: Yes   Mood/Affect: Normal         Dilation     Both eyes: 2.5% Phenylephrine  @ 9:37 AM           Slit Lamp and Fundus Exam     External Exam        Right Left   External Normal Normal         Slit Lamp Exam       Right Left   Lids/Lashes Dermatochalasis - upper lid Dermatochalasis - upper lid   Conjunctiva/Sclera Nasal and temporal Pinguecula Nasal and temporal Pinguecula   Cornea Debris in tear film Debris in tear film   Anterior Chamber Deep and clear Deep and clear   Iris Round and dilated, No NVI Round and dilated, No NVI   Lens 1-2+ Nuclear sclerosis, 1-2+ Cortical cataract 1-2+ Nuclear sclerosis, 1-2+ Cortical cataract   Anterior Vitreous Vitreous syneresis Vitreous syneresis         Fundus Exam       Right Left   Disc Pink and sharp, mild PPA Pink and sharp, mild PPA   C/D Ratio 0.4 0.5   Macula Flat, Blunted foveal reflex, focal MA/edema inferior --increased Flat, Blunted foveal reflex, focal MA/DBH and edema   Vessels Vascular attenuation, Telangiectasia, no NV Vascular attenuation, Telangiectasia, no NV   Periphery Attached, scattered MA/DBH greatest posteriorly, focal DBH outside ST arcades Attached, scattered MA/DBH greatest posteriorly.           IMAGING AND PROCEDURES  Imaging and Procedures for 09/01/2023  OCT, Retina - OU - Both Eyes        Right Eye Quality was good. Central Foveal Thickness: 403. Progression has worsened. Findings include no SRF, abnormal foveal contour, intraretinal hyper-reflective material, intraretinal fluid, vitreomacular adhesion (Mild interval increase in IRF/IRHM inferior fovea and macula).   Left Eye Quality was good.  Central Foveal Thickness: 359. Progression has worsened. Findings include no SRF, abnormal foveal contour, intraretinal hyper-reflective material, intraretinal fluid, vitreomacular adhesion (Persistent +IRF/IRHM temporal fovea and macula--slightly increased. ).   Notes  *Images captured and stored on drive  Diagnosis / Impression:  +DME OU  OD: Mild interval increase in IRF/IRHM inferior fovea and macula OS: Persistent +IRF/IRHM temporal  fovea and  macula--slightly increased.   Clinical management:  See below  Abbreviations: NFP - Normal foveal profile. CME - cystoid macular edema. PED - pigment epithelial detachment. IRF - intraretinal fluid. SRF - subretinal fluid. EZ - ellipsoid zone. ERM - epiretinal membrane. ORA - outer retinal atrophy. ORT - outer retinal tubulation. SRHM - subretinal hyper-reflective material. IRHM - intraretinal hyper-reflective material      Intravitreal Injection, Pharmacologic Agent - OD - Right Eye       Time Out 09/01/2023. 10:19 AM. Confirmed correct patient, procedure, site, and patient consented.   Anesthesia Topical anesthesia was used. Anesthetic medications included Lidocaine  2%, Proparacaine 0.5%.   Procedure Preparation included 5% betadine to ocular surface, eyelid speculum. A (32g) needle was used.   Injection: 1.25 mg Bevacizumab  1.25mg /0.44ml   Route: Intravitreal, Site: Right Eye   NDC: C2662926, Lot: [id number], Expiration date: 11/23/2023   Post-op Post injection exam found visual acuity of at least counting fingers. The patient tolerated the procedure well. There were no complications. The patient received written and verbal post procedure care education.      Intravitreal Injection, Pharmacologic Agent - OS - Left Eye       Time Out 09/01/2023. 10:20 AM. Confirmed correct patient, procedure, site, and patient consented.   Anesthesia Topical anesthesia was used. Anesthetic medications included Lidocaine  2%, Proparacaine 0.5%.   Procedure Preparation included 5% betadine to ocular surface, eyelid speculum. A (32g) needle was used.   Injection: 1.25 mg Bevacizumab  1.25mg /0.56ml   Route: Intravitreal, Site: Left Eye   NDC: C2662926, Lot: [id number], Expiration date: 12/23/2023   Post-op Post injection exam found visual acuity of at least counting fingers. The patient tolerated the procedure well. There were no complications. The patient received written and verbal post  procedure care education.           ASSESSMENT/PLAN:   ICD-10-CM   1. Moderate nonproliferative diabetic retinopathy of both eyes with macular edema associated with type 2 diabetes mellitus (HCC)  E11.3313 OCT, Retina - OU - Both Eyes    Intravitreal Injection, Pharmacologic Agent - OD - Right Eye    Intravitreal Injection, Pharmacologic Agent - OS - Left Eye    Bevacizumab  (AVASTIN ) SOLN 1.25 mg    Bevacizumab  (AVASTIN ) SOLN 1.25 mg    2. Current use of insulin (HCC)  Z79.4     3. Diabetes mellitus treated with oral medication (HCC)  E11.9    Z79.84     4. Essential hypertension  I10     5. Hypertensive retinopathy of both eyes  H35.033     6. Combined forms of age-related cataract of both eyes  H25.813      **Pt was delayed to f/u 8.5 months instead of 4 wks**  1-3. Moderate Non-proliferative diabetic retinopathy w/ DME, both eyes  - lost to f/u from 08.26.24 to 05.14.25 (8.5 mos)  - s/p IVA OD #1 (05.29.24), #2 (07.01.24), #3 (07.29.24), #4 (08.26.24)  - s/p IVA OS #1 (07.01.24), #2 (07.29.24), #3 (08.26.24) - Latest A1C 9.0 on 10.23.24 - BCVA OU 20/25 - FA (05.29.24) + Mod NPDR OU, shows OD: Scattered leaking  MA greatest inferior macula, no NV, OS; Scattered leaking MA greatest temporal macula, no NV - OCT shows +DME OU, OD; Mild interval increase in IRF/IRHM inferior fovea and macula; OS: Persistent +IRF/IRHM temporal  fovea and macula--slightly increased.  **discussed decreased efficacy / resistance to Avastin  and potential benefit of switching medication**   - will work on Eylea approval for next visit - recommend IVA OD #5 and IVA OS #4  today, 05.14.25 - pt wishes to proceed - RBA of procedure discussed, questions answered  - IVA informed consent obtained and signed OU 5.29.24 - see procedure note - f/u in 5 wks -- DFE/OCT, possible injection   4,5. Hypertensive retinopathy OU - discussed importance of tight BP control - monitor  6. Mixed Cataract OU - The  symptoms of cataract, surgical options, and treatments and risks were discussed with patient. - discussed diagnosis and progression - monitor  Ophthalmic Meds Ordered this visit:  Meds ordered this encounter  Medications   Bevacizumab  (AVASTIN ) SOLN 1.25 mg   Bevacizumab  (AVASTIN ) SOLN 1.25 mg     Return in about 5 weeks (around 10/06/2023) for NPDR OU, DFE, OCT, likely injetions OU.  There are no Patient Instructions on file for this visit.  This document serves as a record of services personally performed by Jeanice Millard, MD, PhD. It was created on their behalf by Angelia Kelp, an ophthalmic technician. The creation of this record is the provider's dictation and/or activities during the visit.    Electronically signed by: Angelia Kelp, OA, 09/05/23  11:42 PM  Jeanice Millard, M.D., Ph.D. Diseases & Surgery of the Retina and Vitreous [facility] 09/01/2023  I have reviewed the above documentation for accuracy and completeness, and I agree with the above. Jeanice Millard, M.D., Ph.D. 09/05/23 11:47 PM   Abbreviations: M myopia (nearsighted); A astigmatism; H hyperopia (farsighted); P presbyopia; Mrx spectacle prescription;  CTL contact lenses; OD right eye; OS left eye; OU both eyes  XT exotropia; ET esotropia; PEK punctate epithelial keratitis; PEE punctate epithelial erosions; DES dry eye syndrome; MGD meibomian gland dysfunction; ATs artificial tears; PFAT's preservative free artificial tears; NSC nuclear sclerotic cataract; PSC posterior subcapsular cataract; ERM epi-retinal membrane; PVD posterior vitreous detachment; RD retinal detachment; DM diabetes mellitus; DR diabetic retinopathy; NPDR non-proliferative diabetic retinopathy; PDR proliferative diabetic retinopathy; CSME clinically significant macular edema; DME diabetic macular edema; dbh dot blot hemorrhages; CWS cotton wool spot; POAG primary open angle glaucoma; C/D cup-to-disc ratio; HVF  humphrey visual field; GVF goldmann visual field; OCT optical coherence tomography; IOP intraocular pressure; BRVO Branch retinal vein occlusion; CRVO central retinal vein occlusion; CRAO central retinal artery occlusion; BRAO branch retinal artery occlusion; RT retinal tear; SB scleral buckle; PPV pars plana vitrectomy; VH Vitreous hemorrhage; PRP panretinal laser photocoagulation; IVK intravitreal kenalog; VMT vitreomacular traction; MH Macular hole;  NVD neovascularization of the disc; NVE neovascularization elsewhere; AREDS age related eye disease study; ARMD age related macular degeneration; POAG primary open angle glaucoma; EBMD epithelial/anterior basement membrane dystrophy; ACIOL anterior chamber intraocular lens; IOL intraocular lens; PCIOL posterior chamber intraocular lens; Phaco/IOL phacoemulsification with intraocular lens placement; PRK photorefractive keratectomy; LASIK laser assisted in situ keratomileusis; HTN hypertension; DM diabetes mellitus; COPD chronic obstructive pulmonary disease

## 2023-09-01 ENCOUNTER — Encounter (INDEPENDENT_AMBULATORY_CARE_PROVIDER_SITE_OTHER): Payer: Self-pay | Admitting: Ophthalmology

## 2023-09-01 ENCOUNTER — Ambulatory Visit (INDEPENDENT_AMBULATORY_CARE_PROVIDER_SITE_OTHER): Admitting: Ophthalmology

## 2023-09-01 DIAGNOSIS — E113313 Type 2 diabetes mellitus with moderate nonproliferative diabetic retinopathy with macular edema, bilateral: Secondary | ICD-10-CM

## 2023-09-01 DIAGNOSIS — E119 Type 2 diabetes mellitus without complications: Secondary | ICD-10-CM

## 2023-09-01 DIAGNOSIS — Z794 Long term (current) use of insulin: Secondary | ICD-10-CM

## 2023-09-01 DIAGNOSIS — H35033 Hypertensive retinopathy, bilateral: Secondary | ICD-10-CM

## 2023-09-01 DIAGNOSIS — I10 Essential (primary) hypertension: Secondary | ICD-10-CM | POA: Diagnosis not present

## 2023-09-01 DIAGNOSIS — Z7984 Long term (current) use of oral hypoglycemic drugs: Secondary | ICD-10-CM

## 2023-09-01 DIAGNOSIS — H25813 Combined forms of age-related cataract, bilateral: Secondary | ICD-10-CM

## 2023-09-01 MED ORDER — BEVACIZUMAB CHEMO INJECTION 1.25MG/0.05ML SYRINGE FOR KALEIDOSCOPE
1.2500 mg | INTRAVITREAL | Status: AC | PRN
  Administered 2023-09-01: 1.25 mg via INTRAVITREAL

## 2023-09-24 NOTE — Progress Notes (Shared)
 [facility] - Clinic Note  10/07/2023   CHIEF COMPLAINT Patient presents for No chief complaint on file.  HISTORY OF PRESENT ILLNESS: Ryan Jones is a 68 y.o. male who presents to the clinic today for:   Pt states he hasn't been here because his wife had to have neck surgery and was focused on that. [facility] has been intermittently blurry but feels it could be allergies.   Referring physician: Sidra Dredge, MD [address] [place],  [place] [address]  HISTORICAL INFORMATION:  Selected notes from the MEDICAL RECORD NUMBER Referred by Dr. Diedre Fox for DME OU LEE:  Ocular Hx- PMH-   CURRENT MEDICATIONS: No current outpatient medications on file. (Ophthalmic Drugs)   No current facility-administered medications for this visit. (Ophthalmic Drugs)   Current Outpatient Medications (Other)  Medication Sig   albuterol  (PROVENTIL  HFA;VENTOLIN  HFA) 108 (90 Base) MCG/ACT inhaler Inhale 2 puffs into the lungs every 6 (six) hours as needed. For shortness of breath.   amLODipine  (NORVASC ) 10 MG tablet Take 10 mg by mouth in the morning.   carvedilol  (COREG ) 6.25 MG tablet Take 1 tablet (6.25 mg total) by mouth 2 (two) times daily.   EPINEPHrine  0.3 mg/0.3 mL IJ SOAJ injection Inject 0.3 mg into the muscle as needed for anaphylaxis.   furosemide  (LASIX ) 20 MG tablet Take 1 tablet (20 mg total) by mouth daily. (Patient not taking: Reported on 08/20/2021)   glipiZIDE  (GLUCOTROL ) 5 MG tablet Take 5 mg by mouth in the morning.   hydrochlorothiazide  (HYDRODIURIL ) 25 MG tablet Take 25 mg by mouth in the morning.   HYDROcodone -acetaminophen  (NORCO/VICODIN) 5-325 MG tablet Take 1 tablet by mouth every 4 (four) hours as needed.   ibuprofen (ADVIL,MOTRIN) 800 MG tablet Take 800 mg by mouth every 8 (eight) hours as needed (for pain.).    indomethacin  (INDOCIN ) 50 MG capsule TAKE 1 CAPSULE BY MOUTH THREE TIMES DAILY AS NEEDED FOR MODERATE PAIN (GOUT)   losartan  (COZAAR ) 100  MG tablet TAKE 1 TABLET BY MOUTH EVERY DAY   metFORMIN  (GLUCOPHAGE ) 1000 MG tablet Take 1 tablet (1,000 mg total) by mouth 2 (two) times daily with a meal.   mometasone  (NASONEX ) 50 MCG/ACT nasal spray 2 sprays each nares once daily (Patient taking differently: Place 2 sprays into the nose daily as needed (for allergies.).)   Multiple Vitamin (MULTIVITAMIN WITH MINERALS) TABS tablet Take 1 tablet by mouth in the morning.   omeprazole  (PRILOSEC) 40 MG capsule Take 40 mg by mouth daily.   OZEMPIC, 2 MG/DOSE, 8 MG/3ML SOPN Inject 3 mg into the skin every Saturday. (Patient not taking: Reported on 09/01/2023)   pioglitazone  (ACTOS ) 30 MG tablet Take 30 mg by mouth daily at 12 noon.    pravastatin (PRAVACHOL) 20 MG tablet Take 20 mg by mouth in the morning.   tamsulosin (FLOMAX) 0.4 MG CAPS capsule Take 0.4 mg by mouth every evening.   TRESIBA FLEXTOUCH 200 UNIT/ML FlexTouch Pen Inject 35 Units into the skin in the morning.   UNABLE TO FIND Inject 1 Dose as directed every 14 (fourteen) days. Allergy shots [place] dr Oscar Blazing   No current facility-administered medications for this visit. (Other)   REVIEW OF SYSTEMS:   ALLERGIES Allergies  Allergen Reactions   Augmentin  [Amoxicillin -Pot Clavulanate] Other (See Comments)    Thrush and yeast infection Has patient had a PCN reaction causing immediate rash, facial/tongue/throat swelling, SOB or lightheadedness with hypotension: No Has patient had a PCN  reaction causing severe rash involving mucus membranes or skin necrosis: No Has patient had a PCN reaction that required hospitalization: No Has patient had a PCN reaction occurring within the last 10 years: No If all of the above answers are NO, then may proceed with Cephalosporin use.     Codeine Itching   PAST MEDICAL HISTORY Past Medical History:  Diagnosis Date   Arthritis    Atrial flutter (HCC) 02/26/2011   s/p RFCA 02/2011   Bronchitis    h/o recurrent bronchitis; usually get it  1-2X/wy; last time 12/2010   Cancer [facility]) 2011   right ear,basal   Constipation    Diabetes mellitus    pills   Diarrhea    GERD (gastroesophageal reflux disease)    Gout    get it in my hands and feet   Headache    sinus headache   Hemorrhoids    Hyperlipidemia    Hypertension    Kidney stone    1999   Seasonal allergies 02/26/2011   I take an allergy shot q week   Sleep apnea    no cpap used much since nasal surgery yrs ago   Past Surgical History:  Procedure Laterality Date   ATRIAL FLUTTER ABLATION N/A 02/26/2011   Procedure: ATRIAL FLUTTER ABLATION;  Surgeon: Verona Goodwill, MD;  Location: [facility] CATH LAB;  Service: Cardiovascular;  Laterality: N/A;   BACK SURGERY  before 1995 and in 1995;    ruptured disc repaired; lower back   CARDIAC ELECTROPHYSIOLOGY STUDY AND ABLATION  02/26/2011   for atrial fib   COLONOSCOPY WITH PROPOFOL  N/A 01/18/2015   Procedure: COLONOSCOPY WITH PROPOFOL ;  Surgeon: Alvis Jourdain, MD;  Location: [facility] ENDOSCOPY;  Service: Endoscopy;  Laterality: N/A;   COLONOSCOPY WITH PROPOFOL  N/A 02/04/2018   Procedure: COLONOSCOPY WITH PROPOFOL ;  Surgeon: Alvis Jourdain, MD;  Location: [facility] ENDOS[facility];  Service: Endoscopy;  Laterality: N/A;   COLONOSCOPY WITH PROPOFOL  N/A 08/22/2021   Procedure: COLONOSCOPY WITH PROPOFOL ;  Surgeon: Alvis Jourdain, MD;  Location: [facility];  Service: Endoscopy;  Laterality: N/A;   ESOPHAGOGASTRODUODENOSCOPY (EGD) WITH PROPOFOL  N/A 08/22/2021   Procedure: ESOPHAGOGASTRODUODENOSCOPY (EGD) WITH PROPOFOL ;  Surgeon: Alvis Jourdain, MD;  Location: [facility];  Service: Endoscopy;  Laterality: N/A;   LUMBAR DISC SURGERY  ~ 1993   put foam in back; in place of disc   LUMBAR DISC SURGERY  04/1993   replaced gel foam that had blown out   nasal septum surgery to open up sinuses  yrs ago   dr Johnathan Myron   POLYPECTOMY  02/04/2018   Procedure: POLYPECTOMY;  Surgeon: Alvis Jourdain, MD;  Location: [facility];  Service: Endoscopy;;   POLYPECTOMY   08/22/2021   Procedure: POLYPECTOMY;  Surgeon: Alvis Jourdain, MD;  Location: [facility];  Service: Endoscopy;;   SKIN CANCER EXCISION  2011   right ear   squamous cell area removed from right arm  6 weeks ago   FAMILY HISTORY Family History  Problem Relation Age of Onset   Heart attack Father    SOCIAL HISTORY Social History   Tobacco Use   Smoking status: Never   Smokeless tobacco: Current    Types: Snuff   Tobacco comments:    stopped cigars ~ 2002  Vaping Use   Vaping status: Never Used  Substance Use Topics   Alcohol use: No   Drug use: No       OPHTHALMIC EXAM:  Not recorded    IMAGING AND PROCEDURES  Imaging and Procedures  for 10/07/2023         ASSESSMENT/PLAN:   ICD-10-CM   1. Moderate nonproliferative diabetic retinopathy of both eyes with macular edema associated with type 2 diabetes mellitus (HCC)  Z61.0960     2. Current use of insulin (HCC)  Z79.4     3. Diabetes mellitus treated with oral medication (HCC)  E11.9    Z79.84     4. Essential hypertension  I10     5. Hypertensive retinopathy of both eyes  H35.033     6. Combined forms of age-related cataract of both eyes  H25.813      1-3. Moderate Non-proliferative diabetic retinopathy w/ DME, both eyes  - lost to f/u from 08.26.24 to 05.14.25 (8.5 mos)  - s/p IVA OD #1 (05.29.24), #2 (07.01.24), #3 (07.29.24), #4 (08.26.24), #5 (05.14.25)  - s/p IVA OS #1 (07.01.24), #2 (07.29.24), #3 (08.26.24), #4 (05.14.25) - Latest A1C 9.0 on 10.23.24 - BCVA OU 20/25 - FA (05.29.24) + Mod NPDR OU, shows OD: Scattered leaking MA greatest inferior macula, no NV, OS; Scattered leaking MA greatest temporal macula, no NV - OCT shows +DME OU, OD; Mild interval increase in IRF/IRHM inferior fovea and macula; OS: Persistent +IRF/IRHM temporal  fovea and macula--slightly increased.  **discussed decreased efficacy / resistance to Avastin  and potential benefit of switching medication**   - will work on Eylea  approval for next visit - recommend IVA OD #6 and IVA OS #5 today, 06.19.25 - pt wishes to proceed - RBA of procedure discussed, questions answered  - IVA informed consent obtained and signed OU 5.29.24 - see procedure note - f/u in 5 wks -- DFE/OCT, possible injection   4,5. Hypertensive retinopathy OU - discussed importance of tight BP control - monitor  6. Mixed Cataract OU - The symptoms of cataract, surgical options, and treatments and risks were discussed with patient. - discussed diagnosis and progression - monitor  Ophthalmic Meds Ordered this visit:  No orders of the defined types were placed in this encounter.    No follow-ups on file.  There are no Patient Instructions on file for this visit.  This document serves as a record of services personally performed by Jeanice Millard, MD, PhD. It was created on their behalf by Morley Arabia. Bevin Bucks, OA an ophthalmic technician. The creation of this record is the provider's dictation and/or activities during the visit.    Electronically signed by: Morley Arabia. Antionette Kirks 09/24/23 12:37 PM   Jeanice Millard, M.D., Ph.D. Diseases & Surgery of the Retina and Vitreous [facility] 10/07/2023    Abbreviations: M myopia (nearsighted); A astigmatism; H hyperopia (farsighted); P presbyopia; Mrx spectacle prescription;  CTL contact lenses; OD right eye; OS left eye; OU both eyes  XT exotropia; ET esotropia; PEK punctate epithelial keratitis; PEE punctate epithelial erosions; DES dry eye syndrome; MGD meibomian gland dysfunction; ATs artificial tears; PFAT's preservative free artificial tears; NSC nuclear sclerotic cataract; PSC posterior subcapsular cataract; ERM epi-retinal membrane; PVD posterior vitreous detachment; RD retinal detachment; DM diabetes mellitus; DR diabetic retinopathy; NPDR non-proliferative diabetic retinopathy; PDR proliferative diabetic retinopathy; CSME clinically significant macular edema; DME  diabetic macular edema; dbh dot blot hemorrhages; CWS cotton wool spot; POAG primary open angle glaucoma; C/D cup-to-disc ratio; HVF humphrey visual field; GVF goldmann visual field; OCT optical coherence tomography; IOP intraocular pressure; BRVO Branch retinal vein occlusion; CRVO central retinal vein occlusion; CRAO central retinal artery occlusion; BRAO branch retinal artery occlusion; RT retinal tear;  SB scleral buckle; PPV pars plana vitrectomy; VH Vitreous hemorrhage; PRP panretinal laser photocoagulation; IVK intravitreal kenalog; VMT vitreomacular traction; MH Macular hole;  NVD neovascularization of the disc; NVE neovascularization elsewhere; AREDS age related eye disease study; ARMD age related macular degeneration; POAG primary open angle glaucoma; EBMD epithelial/anterior basement membrane dystrophy; ACIOL anterior chamber intraocular lens; IOL intraocular lens; PCIOL posterior chamber intraocular lens; Phaco/IOL phacoemulsification with intraocular lens placement; PRK photorefractive keratectomy; LASIK laser assisted in situ keratomileusis; HTN hypertension; DM diabetes mellitus; COPD chronic obstructive pulmonary disease

## 2023-10-06 ENCOUNTER — Encounter (INDEPENDENT_AMBULATORY_CARE_PROVIDER_SITE_OTHER): Admitting: Ophthalmology

## 2023-10-07 ENCOUNTER — Encounter (INDEPENDENT_AMBULATORY_CARE_PROVIDER_SITE_OTHER): Admitting: Ophthalmology

## 2023-10-07 DIAGNOSIS — H25813 Combined forms of age-related cataract, bilateral: Secondary | ICD-10-CM

## 2023-10-07 DIAGNOSIS — H35033 Hypertensive retinopathy, bilateral: Secondary | ICD-10-CM

## 2023-10-07 DIAGNOSIS — E119 Type 2 diabetes mellitus without complications: Secondary | ICD-10-CM

## 2023-10-07 DIAGNOSIS — E113313 Type 2 diabetes mellitus with moderate nonproliferative diabetic retinopathy with macular edema, bilateral: Secondary | ICD-10-CM

## 2023-10-07 DIAGNOSIS — Z794 Long term (current) use of insulin: Secondary | ICD-10-CM

## 2023-10-07 DIAGNOSIS — I10 Essential (primary) hypertension: Secondary | ICD-10-CM

## 2023-10-07 NOTE — Progress Notes (Signed)
 [facility] - Clinic Note  10/08/2023   CHIEF COMPLAINT Patient presents for Retina Follow Up  HISTORY OF PRESENT ILLNESS: Ryan Jones is a 68 y.o. male who presents to the clinic today for:  HPI     Retina Follow Up   Patient presents with  Diabetic Retinopathy.  In both eyes.  Severity is moderate.  Duration of 5 weeks.  Since onset it is stable.  I, the attending physician,  performed the HPI with the patient and updated documentation appropriately.        Comments   5 week Retina eval. Patient states having some allergies this morning. Patient saw Dr.Tanner on Wednesday and has glasses on order. BS 94 A1C 6.9      Last edited by Ronelle Coffee, MD on 10/08/2023  4:34 PM.     Pt states his vision is coming along, his A1c is 6.9, his blood sugar was 157 last time he checked it, pt is now seeing [name]  Referring physician: Rudine Cos, MD [address] [place],  [place] [address]  HISTORICAL INFORMATION:  Selected notes from the MEDICAL RECORD NUMBER Referred by Dr. Diedre Fox for DME OU LEE:  Ocular Hx- PMH-   CURRENT MEDICATIONS: No current outpatient medications on file. (Ophthalmic Drugs)   No current facility-administered medications for this visit. (Ophthalmic Drugs)   Current Outpatient Medications (Other)  Medication Sig   albuterol  (PROVENTIL  HFA;VENTOLIN  HFA) 108 (90 Base) MCG/ACT inhaler Inhale 2 puffs into the lungs every 6 (six) hours as needed. For shortness of breath.   amLODipine  (NORVASC ) 10 MG tablet Take 10 mg by mouth in the morning.   carvedilol  (COREG ) 6.25 MG tablet Take 1 tablet (6.25 mg total) by mouth 2 (two) times daily.   EPINEPHrine  0.3 mg/0.3 mL IJ SOAJ injection Inject 0.3 mg into the muscle as needed for anaphylaxis.   glipiZIDE  (GLUCOTROL ) 5 MG tablet Take 5 mg by mouth in the morning.   hydrochlorothiazide  (HYDRODIURIL ) 25 MG tablet Take 25 mg by mouth in the morning.   HYDROcodone -acetaminophen   (NORCO/VICODIN) 5-325 MG tablet Take 1 tablet by mouth every 4 (four) hours as needed.   ibuprofen (ADVIL,MOTRIN) 800 MG tablet Take 800 mg by mouth every 8 (eight) hours as needed (for pain.).    indomethacin  (INDOCIN ) 50 MG capsule TAKE 1 CAPSULE BY MOUTH THREE TIMES DAILY AS NEEDED FOR MODERATE PAIN (GOUT)   losartan  (COZAAR ) 100 MG tablet TAKE 1 TABLET BY MOUTH EVERY DAY   metFORMIN  (GLUCOPHAGE ) 1000 MG tablet Take 1 tablet (1,000 mg total) by mouth 2 (two) times daily with a meal.   mometasone  (NASONEX ) 50 MCG/ACT nasal spray 2 sprays each nares once daily   Multiple Vitamin (MULTIVITAMIN WITH MINERALS) TABS tablet Take 1 tablet by mouth in the morning.   omeprazole  (PRILOSEC) 40 MG capsule Take 40 mg by mouth daily.   pioglitazone  (ACTOS ) 30 MG tablet Take 30 mg by mouth daily at 12 noon.    pravastatin (PRAVACHOL) 20 MG tablet Take 20 mg by mouth in the morning.   tamsulosin (FLOMAX) 0.4 MG CAPS capsule Take 0.4 mg by mouth every evening.   TRESIBA FLEXTOUCH 200 UNIT/ML FlexTouch Pen Inject 35 Units into the skin in the morning.   UNABLE TO FIND Inject 1 Dose as directed every 14 (fourteen) days. Allergy shots [place] dr Oscar Blazing   furosemide  (LASIX ) 20 MG tablet Take 1 tablet (20 mg total) by mouth daily. (Patient not taking: Reported on  10/08/2023)   OZEMPIC, 2 MG/DOSE, 8 MG/3ML SOPN Inject 3 mg into the skin every Saturday. (Patient not taking: Reported on 10/08/2023)   No current facility-administered medications for this visit. (Other)   REVIEW OF SYSTEMS: ROS   Positive for: Endocrine, Cardiovascular, Eyes Last edited by Leonia Raman, COT on 10/08/2023  9:25 AM.      ALLERGIES Allergies  Allergen Reactions   Augmentin  [Amoxicillin -Pot Clavulanate] Other (See Comments)    Thrush and yeast infection Has patient had a PCN reaction causing immediate rash, facial/tongue/throat swelling, SOB or lightheadedness with hypotension: No Has patient had a PCN reaction causing severe  rash involving mucus membranes or skin necrosis: No Has patient had a PCN reaction that required hospitalization: No Has patient had a PCN reaction occurring within the last 10 years: No If all of the above answers are NO, then may proceed with Cephalosporin use.     Codeine Itching   PAST MEDICAL HISTORY Past Medical History:  Diagnosis Date   Arthritis    Atrial flutter (HCC) 02/26/2011   s/p RFCA 02/2011   Bronchitis    h/o recurrent bronchitis; usually get it 1-2X/wy; last time 12/2010   Cancer [facility]) 2011   right ear,basal   Constipation    Diabetes mellitus    pills   Diarrhea    GERD (gastroesophageal reflux disease)    Gout    get it in my hands and feet   Headache    sinus headache   Hemorrhoids    Hyperlipidemia    Hypertension    Kidney stone    1999   Seasonal allergies 02/26/2011   I take an allergy shot q week   Sleep apnea    no cpap used much since nasal surgery yrs ago   Past Surgical History:  Procedure Laterality Date   ATRIAL FLUTTER ABLATION N/A 02/26/2011   Procedure: ATRIAL FLUTTER ABLATION;  Surgeon: Verona Goodwill, MD;  Location: [facility] CATH LAB;  Service: Cardiovascular;  Laterality: N/A;   BACK SURGERY  before 1995 and in 1995;    ruptured disc repaired; lower back   CARDIAC ELECTROPHYSIOLOGY STUDY AND ABLATION  02/26/2011   for atrial fib   COLONOSCOPY WITH PROPOFOL  N/A 01/18/2015   Procedure: COLONOSCOPY WITH PROPOFOL ;  Surgeon: Alvis Jourdain, MD;  Location: [facility] ENDOSCOPY;  Service: Endoscopy;  Laterality: N/A;   COLONOSCOPY WITH PROPOFOL  N/A 02/04/2018   Procedure: COLONOSCOPY WITH PROPOFOL ;  Surgeon: Alvis Jourdain, MD;  Location: [facility] ENDOSCOPY;  Service: Endoscopy;  Laterality: N/A;   COLONOSCOPY WITH PROPOFOL  N/A 08/22/2021   Procedure: COLONOSCOPY WITH PROPOFOL ;  Surgeon: Alvis Jourdain, MD;  Location: [facility];  Service: Endoscopy;  Laterality: N/A;   ESOPHAGOGASTRODUODENOSCOPY (EGD) WITH PROPOFOL  N/A 08/22/2021   Procedure:  ESOPHAGOGASTRODUODENOSCOPY (EGD) WITH PROPOFOL ;  Surgeon: Alvis Jourdain, MD;  Location: [facility];  Service: Endoscopy;  Laterality: N/A;   LUMBAR DISC SURGERY  ~ 1993   put foam in back; in place of disc   LUMBAR DISC SURGERY  04/1993   replaced gel foam that had blown out   nasal septum surgery to open up sinuses  yrs ago   dr Johnathan Myron   POLYPECTOMY  02/04/2018   Procedure: POLYPECTOMY;  Surgeon: Alvis Jourdain, MD;  Location: [facility];  Service: Endoscopy;;   POLYPECTOMY  08/22/2021   Procedure: POLYPECTOMY;  Surgeon: Alvis Jourdain, MD;  Location: [facility];  Service: Endoscopy;;   SKIN CANCER EXCISION  2011   right ear   squamous cell area removed  from right arm  6 weeks ago   FAMILY HISTORY Family History  Problem Relation Age of Onset   Heart attack Father    SOCIAL HISTORY Social History   Tobacco Use   Smoking status: Never   Smokeless tobacco: Current    Types: Snuff   Tobacco comments:    stopped cigars ~ 2002  Vaping Use   Vaping status: Never Used  Substance Use Topics   Alcohol use: No   Drug use: No       OPHTHALMIC EXAM:  Base Eye Exam     Visual Acuity (Snellen - Linear)       Right Left   Dist [place] 20/30 -3 20/30 -2   Dist ph [place] 20/30 -2 20/NI         Tonometry (Tonopen, 9:28 AM)       Right Left   Pressure 16 16         Pupils       Dark Light Shape React APD   Right 3 2 Round Brisk None   Left 3 2 Round Brisk None         Visual Fields (Counting fingers)       Left Right    Full Full         Extraocular Movement       Right Left    Full, Ortho Full, Ortho         Neuro/Psych     Oriented x3: Yes   Mood/Affect: Normal         Dilation     Both eyes: 1.0% Mydriacyl, 2.5% Phenylephrine  @ 9:28 AM           Slit Lamp and Fundus Exam     External Exam       Right Left   External Normal Normal         Slit Lamp Exam       Right Left   Lids/Lashes Dermatochalasis - upper lid  Dermatochalasis - upper lid   Conjunctiva/Sclera Nasal and temporal Pinguecula Nasal and temporal Pinguecula   Cornea Debris in tear film Debris in tear film   Anterior Chamber Deep and clear Deep and clear   Iris Round and dilated, No NVI Round and dilated, No NVI   Lens 1-2+ Nuclear sclerosis, 1-2+ Cortical cataract 1-2+ Nuclear sclerosis, 1-2+ Cortical cataract   Anterior Vitreous Vitreous syneresis Vitreous syneresis         Fundus Exam       Right Left   Disc Pink and sharp, mild PPA Pink and sharp, mild PPA   C/D Ratio 0.4 0.5   Macula Flat, Blunted foveal reflex, focal MA/edema inferiorly -- increased Flat, Blunted foveal reflex, focal MA/DBH and edema   Vessels Vascular attenuation, Telangiectasia, no NV Vascular attenuation, Telangiectasia, no NV   Periphery Attached, scattered MA/DBH greatest posteriorly, focal DBH outside ST arcades Attached, scattered MA/DBH greatest posteriorly.           IMAGING AND PROCEDURES  Imaging and Procedures for 10/08/2023  OCT, Retina - OU - Both Eyes       Right Eye Quality was good. Central Foveal Thickness: 469. Progression has worsened. Findings include no SRF, abnormal foveal contour, intraretinal hyper-reflective material, intraretinal fluid, vitreomacular adhesion (Mild interval increase in IRF/IRHM inferior fovea and macula).   Left Eye Quality was good. Central Foveal Thickness: 338. Progression has improved. Findings include no SRF, abnormal foveal contour, intraretinal hyper-reflective material, intraretinal fluid, vitreomacular adhesion (Persistent +  IRF/IRHM temporal fovea and macula -- slightly improvement).   Notes *Images captured and stored on drive  Diagnosis / Impression:  +DME OU  OD: Mild interval increase in IRF/IRHM inferior fovea and macula OS: Persistent +IRF/IRHM temporal  fovea and macula--slightly improved  Clinical management:  See below  Abbreviations: NFP - Normal foveal profile. CME - cystoid  macular edema. PED - pigment epithelial detachment. IRF - intraretinal fluid. SRF - subretinal fluid. EZ - ellipsoid zone. ERM - epiretinal membrane. ORA - outer retinal atrophy. ORT - outer retinal tubulation. SRHM - subretinal hyper-reflective material. IRHM - intraretinal hyper-reflective material      Intravitreal Injection, Pharmacologic Agent - OD - Right Eye       Time Out 10/08/2023. 10:57 AM. Confirmed correct patient, procedure, site, and patient consented.   Anesthesia Topical anesthesia was used. Anesthetic medications included Lidocaine  2%, Proparacaine 0.5%.   Procedure Preparation included 5% betadine to ocular surface, eyelid speculum. A (32g) needle was used.   Injection: 1.25 mg Bevacizumab  1.25mg /0.37ml   Route: Intravitreal, Site: Right Eye   NDC: H525437, Lot: [id number], Expiration date: 10/17/2024   Post-op Post injection exam found visual acuity of at least counting fingers. The patient tolerated the procedure well. There were no complications. The patient received written and verbal post procedure care education.      Intravitreal Injection, Pharmacologic Agent - OS - Left Eye       Time Out 10/08/2023. 10:57 AM. Confirmed correct patient, procedure, site, and patient consented.   Anesthesia Topical anesthesia was used. Anesthetic medications included Lidocaine  2%, Proparacaine 0.5%.   Procedure Preparation included 5% betadine to ocular surface, eyelid speculum. A (32g) needle was used.   Injection: 2 mg aflibercept 2 MG/0.05ML   Route: Intravitreal, Site: Left Eye   NDC: D2246706, Lot: [id number], Expiration date: 11/16/2024, Waste: 0 mL   Post-op Post injection exam found visual acuity of at least counting fingers. The patient tolerated the procedure well. There were no complications. The patient received written and verbal post procedure care education.            ASSESSMENT/PLAN:   ICD-10-CM   1. Moderate nonproliferative  diabetic retinopathy of both eyes with macular edema associated with type 2 diabetes mellitus (HCC)  E11.3313 OCT, Retina - OU - Both Eyes    Intravitreal Injection, Pharmacologic Agent - OD - Right Eye    Intravitreal Injection, Pharmacologic Agent - OS - Left Eye    aflibercept (EYLEA) SOLN 2 mg    Bevacizumab  (AVASTIN ) SOLN 1.25 mg    2. Current use of insulin (HCC)  Z79.4     3. Diabetes mellitus treated with oral medication (HCC)  E11.9    Z79.84     4. Essential hypertension  I10     5. Hypertensive retinopathy of both eyes  H35.033     6. Combined forms of age-related cataract of both eyes  H25.813      1-3. Moderate Non-proliferative diabetic retinopathy w/ DME, both eyes  - lost to f/u from 08.26.24 to 05.14.25 (8.5 mos)  - s/p IVA OD #1 (05.29.24), #2 (07.01.24), #3 (07.29.24), #4 (08.26.24), #5 (05.14.25)  - s/p IVA OS #1 (07.01.24), #2 (07.29.24), #3 (08.26.24), #4 (05.14.25) - Latest A1C 9.0 on 10.23.24 - BCVA OU 20/25 - FA (05.29.24) + Mod NPDR OU, shows OD: Scattered leaking MA greatest inferior macula, no NV, OS; Scattered leaking MA greatest temporal macula, no NV - OCT shows +DME OU, OD; Mild interval increase in IRF/IRHM  inferior fovea and macula; OS: Persistent +IRF/IRHM temporal  fovea and macula -- slightly increased.at 5 weeks **discussed decreased efficacy / resistance to Avastin  and potential benefit of switching medication**  - recommend switching to IVE OU #1 today, 06.20.25 w/ f/u in 5 wks - pt wishes to proceed - RBA of procedure discussed, questions answered  - IVE informed consent obtained and signed OU 06.20.25 - see procedure note - Eylea approved through Medicare and [employer] insurance - f/u in 5 wks -- DFE/OCT, possible injection   4,5. Hypertensive retinopathy OU - discussed importance of tight BP control - monitor  6. Mixed Cataract OU - The symptoms of cataract, surgical options, and treatments and risks were discussed with patient. -  discussed diagnosis and progression - monitor  Ophthalmic Meds Ordered this visit:  Meds ordered this encounter  Medications   aflibercept (EYLEA) SOLN 2 mg   Bevacizumab  (AVASTIN ) SOLN 1.25 mg     Return in about 5 weeks (around 11/12/2023) for f/u NPDR OU, DFE, OCT, Possible Injxn.  There are no Patient Instructions on file for this visit.  This document serves as a record of services personally performed by Jeanice Millard, MD, PhD. It was created on their behalf by Morley Arabia. Bevin Bucks, OA an ophthalmic technician. The creation of this record is the provider's dictation and/or activities during the visit.    Electronically signed by: Morley Arabia. Bevin Bucks, OA 10/08/23 4:42 PM  Jeanice Millard, M.D., Ph.D. Diseases & Surgery of the Retina and Vitreous [facility] 10/08/2023  I have reviewed the above documentation for accuracy and completeness, and I agree with the above. Jeanice Millard, M.D., Ph.D. 10/08/23 4:43 PM   Abbreviations: M myopia (nearsighted); A astigmatism; H hyperopia (farsighted); P presbyopia; Mrx spectacle prescription;  CTL contact lenses; OD right eye; OS left eye; OU both eyes  XT exotropia; ET esotropia; PEK punctate epithelial keratitis; PEE punctate epithelial erosions; DES dry eye syndrome; MGD meibomian gland dysfunction; ATs artificial tears; PFAT's preservative free artificial tears; NSC nuclear sclerotic cataract; PSC posterior subcapsular cataract; ERM epi-retinal membrane; PVD posterior vitreous detachment; RD retinal detachment; DM diabetes mellitus; DR diabetic retinopathy; NPDR non-proliferative diabetic retinopathy; PDR proliferative diabetic retinopathy; CSME clinically significant macular edema; DME diabetic macular edema; dbh dot blot hemorrhages; CWS cotton wool spot; POAG primary open angle glaucoma; C/D cup-to-disc ratio; HVF humphrey visual field; GVF goldmann visual field; OCT optical coherence tomography; IOP intraocular pressure;  BRVO Branch retinal vein occlusion; CRVO central retinal vein occlusion; CRAO central retinal artery occlusion; BRAO branch retinal artery occlusion; RT retinal tear; SB scleral buckle; PPV pars plana vitrectomy; VH Vitreous hemorrhage; PRP panretinal laser photocoagulation; IVK intravitreal kenalog; VMT vitreomacular traction; MH Macular hole;  NVD neovascularization of the disc; NVE neovascularization elsewhere; AREDS age related eye disease study; ARMD age related macular degeneration; POAG primary open angle glaucoma; EBMD epithelial/anterior basement membrane dystrophy; ACIOL anterior chamber intraocular lens; IOL intraocular lens; PCIOL posterior chamber intraocular lens; Phaco/IOL phacoemulsification with intraocular lens placement; PRK photorefractive keratectomy; LASIK laser assisted in situ keratomileusis; HTN hypertension; DM diabetes mellitus; COPD chronic obstructive pulmonary disease

## 2023-10-08 ENCOUNTER — Encounter (INDEPENDENT_AMBULATORY_CARE_PROVIDER_SITE_OTHER): Payer: Self-pay | Admitting: Ophthalmology

## 2023-10-08 ENCOUNTER — Ambulatory Visit (INDEPENDENT_AMBULATORY_CARE_PROVIDER_SITE_OTHER): Admitting: Ophthalmology

## 2023-10-08 DIAGNOSIS — Z794 Long term (current) use of insulin: Secondary | ICD-10-CM

## 2023-10-08 DIAGNOSIS — H25813 Combined forms of age-related cataract, bilateral: Secondary | ICD-10-CM

## 2023-10-08 DIAGNOSIS — H35033 Hypertensive retinopathy, bilateral: Secondary | ICD-10-CM

## 2023-10-08 DIAGNOSIS — E119 Type 2 diabetes mellitus without complications: Secondary | ICD-10-CM

## 2023-10-08 DIAGNOSIS — Z7984 Long term (current) use of oral hypoglycemic drugs: Secondary | ICD-10-CM

## 2023-10-08 DIAGNOSIS — E113313 Type 2 diabetes mellitus with moderate nonproliferative diabetic retinopathy with macular edema, bilateral: Secondary | ICD-10-CM | POA: Diagnosis not present

## 2023-10-08 DIAGNOSIS — I10 Essential (primary) hypertension: Secondary | ICD-10-CM

## 2023-10-08 MED ORDER — BEVACIZUMAB CHEMO INJECTION 1.25MG/0.05ML SYRINGE FOR KALEIDOSCOPE
1.2500 mg | INTRAVITREAL | Status: AC | PRN
Start: 2023-10-11 — End: 2023-10-11
  Administered 2023-10-11: 1.25 mg via INTRAVITREAL

## 2023-10-08 MED ORDER — AFLIBERCEPT 2MG/0.05ML IZ SOLN FOR KALEIDOSCOPE
2.0000 mg | INTRAVITREAL | Status: AC | PRN
  Administered 2023-10-08: 2 mg via INTRAVITREAL

## 2023-10-11 MED ORDER — AFLIBERCEPT 2MG/0.05ML IZ SOLN FOR KALEIDOSCOPE
2.0000 mg | INTRAVITREAL | Status: AC | PRN
  Administered 2023-10-11: 2 mg via INTRAVITREAL

## 2023-11-09 NOTE — Progress Notes (Signed)
 [facility] - Clinic Note  11/12/2023   CHIEF COMPLAINT Patient presents for Retina Follow Up  HISTORY OF PRESENT ILLNESS: Ryan Jones is a 68 y.o. male who presents to the clinic today for:  HPI     Retina Follow Up   Patient presents with  Diabetic Retinopathy.  In both eyes.  This started 5 weeks ago.  Duration of 5 weeks.  Since onset it is stable.  I, the attending physician,  performed the HPI with the patient and updated documentation appropriately.        Comments   5 week retina follow up NPDR pt is reporting no vision changes noticed he denies any flashes or floaters       Last edited by Valdemar Rogue, MD on 11/12/2023 12:25 PM.    Pt states he could tell an improvement after first Eylea  inj.   Referring physician: [name], MD [address] [place],  [place] [address]  HISTORICAL INFORMATION:  Selected notes from the MEDICAL RECORD NUMBER Referred by Dr. [name] for DME OU LEE:  Ocular Hx- PMH-   CURRENT MEDICATIONS: No current outpatient medications on file. (Ophthalmic Drugs)   No current facility-administered medications for this visit. (Ophthalmic Drugs)   Current Outpatient Medications (Other)  Medication Sig   albuterol  (PROVENTIL  HFA;VENTOLIN  HFA) 108 (90 Base) MCG/ACT inhaler Inhale 2 puffs into the lungs every 6 (six) hours as needed. For shortness of breath.   amLODipine  (NORVASC ) 10 MG tablet Take 10 mg by mouth in the morning.   carvedilol  (COREG ) 6.25 MG tablet Take 1 tablet (6.25 mg total) by mouth 2 (two) times daily.   EPINEPHrine  0.3 mg/0.3 mL IJ SOAJ injection Inject 0.3 mg into the muscle as needed for anaphylaxis.   glipiZIDE  (GLUCOTROL ) 5 MG tablet Take 5 mg by mouth in the morning.   hydrochlorothiazide  (HYDRODIURIL ) 25 MG tablet Take 25 mg by mouth in the morning.   HYDROcodone -acetaminophen  (NORCO/VICODIN) 5-325 MG tablet Take 1 tablet by mouth every 4 (four) hours as needed.   ibuprofen (ADVIL,MOTRIN)  800 MG tablet Take 800 mg by mouth every 8 (eight) hours as needed (for pain.).    indomethacin  (INDOCIN ) 50 MG capsule TAKE 1 CAPSULE BY MOUTH THREE TIMES DAILY AS NEEDED FOR MODERATE PAIN (GOUT)   losartan  (COZAAR ) 100 MG tablet TAKE 1 TABLET BY MOUTH EVERY DAY   metFORMIN  (GLUCOPHAGE ) 1000 MG tablet Take 1 tablet (1,000 mg total) by mouth 2 (two) times daily with a meal.   mometasone  (NASONEX ) 50 MCG/ACT nasal spray 2 sprays each nares once daily   Multiple Vitamin (MULTIVITAMIN WITH MINERALS) TABS tablet Take 1 tablet by mouth in the morning.   omeprazole  (PRILOSEC) 40 MG capsule Take 40 mg by mouth daily.   OZEMPIC, 2 MG/DOSE, 8 MG/3ML SOPN Inject 3 mg into the skin every Saturday.   pioglitazone  (ACTOS ) 30 MG tablet Take 30 mg by mouth daily at 12 noon.    pravastatin (PRAVACHOL) 20 MG tablet Take 20 mg by mouth in the morning.   tamsulosin (FLOMAX) 0.4 MG CAPS capsule Take 0.4 mg by mouth every evening.   TRESIBA FLEXTOUCH 200 UNIT/ML FlexTouch Pen Inject 35 Units into the skin in the morning.   UNABLE TO FIND Inject 1 Dose as directed every 14 (fourteen) days. Allergy shots Streeter dr altamease   furosemide  (LASIX ) 20 MG tablet Take 1 tablet (20 mg total) by mouth daily. (Patient not taking: Reported on 10/08/2023)   No  current facility-administered medications for this visit. (Other)   REVIEW OF SYSTEMS: ROS   Positive for: Endocrine, Cardiovascular, Eyes Last edited by Resa Delon ORN, COT on 11/12/2023  9:05 AM.       ALLERGIES Allergies  Allergen Reactions   Augmentin  [Amoxicillin -Pot Clavulanate] Other (See Comments)    Thrush and yeast infection Has patient had a PCN reaction causing immediate rash, facial/tongue/throat swelling, SOB or lightheadedness with hypotension: No Has patient had a PCN reaction causing severe rash involving mucus membranes or skin necrosis: No Has patient had a PCN reaction that required hospitalization: No Has patient had a PCN reaction  occurring within the last 10 years: No If all of the above answers are NO, then may proceed with Cephalosporin use.     Codeine Itching   PAST MEDICAL HISTORY Past Medical History:  Diagnosis Date   Arthritis    Atrial flutter (HCC) 02/26/2011   s/p RFCA 02/2011   Bronchitis    h/o recurrent bronchitis; usually get it 1-2X/wy; last time 12/2010   Cancer [facility]) 2011   right ear,basal   Constipation    Diabetes mellitus    pills   Diarrhea    GERD (gastroesophageal reflux disease)    Gout    get it in my hands and feet   Headache    sinus headache   Hemorrhoids    Hyperlipidemia    Hypertension    Kidney stone    1999   Seasonal allergies 02/26/2011   I take an allergy shot q week   Sleep apnea    no cpap used much since nasal surgery yrs ago   Past Surgical History:  Procedure Laterality Date   ATRIAL FLUTTER ABLATION N/A 02/26/2011   Procedure: ATRIAL FLUTTER ABLATION;  Surgeon: Elspeth JAYSON Sage, MD;  Location: [facility] CATH LAB;  Service: Cardiovascular;  Laterality: N/A;   BACK SURGERY  before 1995 and in 1995;    ruptured disc repaired; lower back   CARDIAC ELECTROPHYSIOLOGY STUDY AND ABLATION  02/26/2011   for atrial fib   COLONOSCOPY WITH PROPOFOL  N/A 01/18/2015   Procedure: COLONOSCOPY WITH PROPOFOL ;  Surgeon: Belvie Just, MD;  Location: [facility] ENDOSCOPY;  Service: Endoscopy;  Laterality: N/A;   COLONOSCOPY WITH PROPOFOL  N/A 02/04/2018   Procedure: COLONOSCOPY WITH PROPOFOL ;  Surgeon: Just Belvie, MD;  Location: [facility] ENDOSCOPY;  Service: Endoscopy;  Laterality: N/A;   COLONOSCOPY WITH PROPOFOL  N/A 08/22/2021   Procedure: COLONOSCOPY WITH PROPOFOL ;  Surgeon: Just Belvie, MD;  Location: [facility] ENDOSCOPY;  Service: Endoscopy;  Laterality: N/A;   ESOPHAGOGASTRODUODENOSCOPY (EGD) WITH PROPOFOL  N/A 08/22/2021   Procedure: ESOPHAGOGASTRODUODENOSCOPY (EGD) WITH PROPOFOL ;  Surgeon: Just Belvie, MD;  Location: [facility] ENDOSCOPY;  Service: Endoscopy;  Laterality: N/A;   LUMBAR DISC  SURGERY  ~ 1993   put foam in back; in place of disc   LUMBAR DISC SURGERY  04/1993   replaced gel foam that had blown out   nasal septum surgery to open up sinuses  yrs ago   dr lazaro   POLYPECTOMY  02/04/2018   Procedure: POLYPECTOMY;  Surgeon: Just Belvie, MD;  Location: [facility] ENDOSCOPY;  Service: Endoscopy;;   POLYPECTOMY  08/22/2021   Procedure: POLYPECTOMY;  Surgeon: Just Belvie, MD;  Location: [facility] ENDOSCOPY;  Service: Endoscopy;;   SKIN CANCER EXCISION  2011   right ear   squamous cell area removed from right arm  6 weeks ago   FAMILY HISTORY Family History  Problem Relation Age of Onset   Heart attack Father  SOCIAL HISTORY Social History   Tobacco Use   Smoking status: Never   Smokeless tobacco: Current    Types: Snuff   Tobacco comments:    stopped cigars ~ 2002  Vaping Use   Vaping status: Never Used  Substance Use Topics   Alcohol use: No   Drug use: No       OPHTHALMIC EXAM:  Base Eye Exam     Visual Acuity (Snellen - Linear)       Right Left   Dist [place] 20/40 -2 20/30 -3   Dist ph [place] NI NI         Tonometry (Tonopen, 9:10 AM)       Right Left   Pressure 17 18         Pupils       Pupils Dark Light Shape React APD   Right PERRL 3 2 Round Brisk None   Left PERRL 3 2 Round Brisk None         Visual Fields       Left Right    Full Full         Extraocular Movement       Right Left    Full, Ortho Full, Ortho         Neuro/Psych     Oriented x3: Yes   Mood/Affect: Normal         Dilation     Both eyes: 2.5% Phenylephrine  @ 9:10 AM           Slit Lamp and Fundus Exam     External Exam       Right Left   External Normal Normal         Slit Lamp Exam       Right Left   Lids/Lashes Dermatochalasis - upper lid Dermatochalasis - upper lid   Conjunctiva/Sclera Nasal and temporal Pinguecula Nasal and temporal Pinguecula   Cornea Debris in tear film Debris in tear film   Anterior Chamber Deep and clear  Deep and clear   Iris Round and dilated, No NVI Round and dilated, No NVI   Lens 1-2+ Nuclear sclerosis, 1-2+ Cortical cataract 1-2+ Nuclear sclerosis, 1-2+ Cortical cataract   Anterior Vitreous Vitreous syneresis Vitreous syneresis         Fundus Exam       Right Left   Disc Pink and sharp, mild PPA Pink and sharp, mild PPA   C/D Ratio 0.4 0.5   Macula Flat, Blunted foveal reflex, focal MA/edema inferiorly --slightly improved Flat, Blunted foveal reflex, focal MA/DBH and edema-slightly improved   Vessels Vascular attenuation, Telangiectasia, no NV Vascular attenuation, Telangiectasia, no NV   Periphery Attached, scattered MA/DBH greatest posteriorly Attached, scattered MA/DBH greatest posteriorly.           IMAGING AND PROCEDURES  Imaging and Procedures for 11/12/2023  OCT, Retina - OU - Both Eyes       Right Eye Quality was good. Central Foveal Thickness: 459. Progression has improved. Findings include no SRF, abnormal foveal contour, intraretinal hyper-reflective material, intraretinal fluid, vitreomacular adhesion (Persistent IRF/IRHM inferior fovea and macula--slightly improved).   Left Eye Quality was good. Central Foveal Thickness: 336. Progression has improved. Findings include no SRF, abnormal foveal contour, intraretinal hyper-reflective material, intraretinal fluid, vitreomacular adhesion (Persistent +IRF/IRHM temporal fovea and macula -- slightly improvement).   Notes *Images captured and stored on drive  Diagnosis / Impression:  +DME OU  OD: Persistent IRF/IRHM inferior fovea and macula--slightly improved OS: Persistent  IRF/IRHM temporal  fovea and macula--slightly improved  Clinical management:  See below  Abbreviations: NFP - Normal foveal profile. CME - cystoid macular edema. PED - pigment epithelial detachment. IRF - intraretinal fluid. SRF - subretinal fluid. EZ - ellipsoid zone. ERM - epiretinal membrane. ORA - outer retinal atrophy. ORT - outer retinal  tubulation. SRHM - subretinal hyper-reflective material. IRHM - intraretinal hyper-reflective material      Intravitreal Injection, Pharmacologic Agent - OD - Right Eye       Time Out 11/12/2023. 9:58 AM. Confirmed correct patient, procedure, site, and patient consented.   Anesthesia Topical anesthesia was used. Anesthetic medications included Lidocaine  2%, Proparacaine 0.5%.   Procedure Preparation included 5% betadine to ocular surface, eyelid speculum. A (32g) needle was used.   Injection: 2 mg aflibercept  2 MG/0.05ML   Route: Intravitreal, Site: Right Eye   NDC: D2246706, Lot: [id number], Expiration date: 01/17/2025, Waste: 0 mL   Post-op Post injection exam found visual acuity of at least counting fingers. The patient tolerated the procedure well. There were no complications. The patient received written and verbal post procedure care education.   Notes       Intravitreal Injection, Pharmacologic Agent - OS - Left Eye       Time Out 11/12/2023. 9:59 AM. Confirmed correct patient, procedure, site, and patient consented.   Anesthesia Topical anesthesia was used. Anesthetic medications included Lidocaine  2%, Proparacaine 0.5%.   Procedure Preparation included 5% betadine to ocular surface, eyelid speculum. A (32g) needle was used.   Injection: 2 mg aflibercept  2 MG/0.05ML   Route: Intravitreal, Site: Left Eye   NDC: D2246706, Lot: [id number], Expiration date: 02/16/2025, Waste: 0 mL   Post-op Post injection exam found visual acuity of at least counting fingers. The patient tolerated the procedure well. There were no complications. The patient received written and verbal post procedure care education.           ASSESSMENT/PLAN:   ICD-10-CM   1. Moderate nonproliferative diabetic retinopathy of both eyes with macular edema associated with type 2 diabetes mellitus (HCC)  E11.3313 OCT, Retina - OU - Both Eyes    Intravitreal Injection, Pharmacologic Agent  - OD - Right Eye    Intravitreal Injection, Pharmacologic Agent - OS - Left Eye    aflibercept  (EYLEA ) SOLN 2 mg    aflibercept  (EYLEA ) SOLN 2 mg    2. Current use of insulin (HCC)  Z79.4     3. Diabetes mellitus treated with oral medication (HCC)  E11.9    Z79.84     4. Essential hypertension  I10     5. Hypertensive retinopathy of both eyes  H35.033     6. Combined forms of age-related cataract of both eyes  H25.813       1-3. Moderate Non-proliferative diabetic retinopathy w/ DME, both eyes  - lost to f/u from 08.26.24 to 05.14.25 (8.5 mos)  - most recent A1c 6.9% on 05.21.25 - FA (05.29.24) + Mod NPDR OU, shows OD: Scattered leaking MA greatest inferior macula, no NV, OS; Scattered leaking MA greatest temporal macula, no NV  - s/p IVA OD #1 (05.29.24), #2 (07.01.24), #3 (07.29.24), #4 (08.26.24), #5 (05.14.25)  - s/p IVA OS #1 (07.01.24), #2 (07.29.24), #3 (08.26.24), #4 (05.14.25)  **IVA resistance**  - s/p IVE OU #1 (06.20.2025) - BCVA OU 20/25 - OCT shows +DME OU, OD: Persistent IRF/IRHM inferior fovea and macula--slightly improved; OS: Persistent IRF/IRHM temporal  fovea and macula--slightly improved at 5 weeks - recommend IVE  OU #2 today, 07.25.25 w/ f/u in 5 wks - pt wishes to proceed - RBA of procedure discussed, questions answered  - IVE informed consent obtained and signed OU 06.20.25 - see procedure note - Eylea  approved through Medicare and [employer] insurance - f/u in 5 wks -- DFE/OCT, possible injection   4,5. Hypertensive retinopathy OU - discussed importance of tight BP control - monitor  6. Mixed Cataract OU - The symptoms of cataract, surgical options, and treatments and risks were discussed with patient. - discussed diagnosis and progression - monitor  Ophthalmic Meds Ordered this visit:  Meds ordered this encounter  Medications   aflibercept  (EYLEA ) SOLN 2 mg   aflibercept  (EYLEA ) SOLN 2 mg     Return in about 5 weeks (around 12/17/2023) for NPDR  OU, DFE, OCT, Possible Injxn.  There are no Patient Instructions on file for this visit.  This document serves as a record of services personally performed by Redell JUDITHANN Hans, MD, PhD. It was created on their behalf by Delon Newness COT, an ophthalmic technician. The creation of this record is the provider's dictation and/or activities during the visit.    Electronically signed by: Delon Newness COT 07.22.2025 12:27 PM  Redell JUDITHANN Hans, M.D., Ph.D. Diseases & Surgery of the Retina and Vitreous [facility] 11/12/2023   I have reviewed the above documentation for accuracy and completeness, and I agree with the above. Redell JUDITHANN Hans, M.D., Ph.D. 11/12/23 12:30 PM   Abbreviations: M myopia (nearsighted); A astigmatism; H hyperopia (farsighted); P presbyopia; Mrx spectacle prescription;  CTL contact lenses; OD right eye; OS left eye; OU both eyes  XT exotropia; ET esotropia; PEK punctate epithelial keratitis; PEE punctate epithelial erosions; DES dry eye syndrome; MGD meibomian gland dysfunction; ATs artificial tears; PFAT's preservative free artificial tears; NSC nuclear sclerotic cataract; PSC posterior subcapsular cataract; ERM epi-retinal membrane; PVD posterior vitreous detachment; RD retinal detachment; DM diabetes mellitus; DR diabetic retinopathy; NPDR non-proliferative diabetic retinopathy; PDR proliferative diabetic retinopathy; CSME clinically significant macular edema; DME diabetic macular edema; dbh dot blot hemorrhages; CWS cotton wool spot; POAG primary open angle glaucoma; C/D cup-to-disc ratio; HVF humphrey visual field; GVF goldmann visual field; OCT optical coherence tomography; IOP intraocular pressure; BRVO Branch retinal vein occlusion; CRVO central retinal vein occlusion; CRAO central retinal artery occlusion; BRAO branch retinal artery occlusion; RT retinal tear; SB scleral buckle; PPV pars plana vitrectomy; VH Vitreous hemorrhage; PRP panretinal  laser photocoagulation; IVK intravitreal kenalog; VMT vitreomacular traction; MH Macular hole;  NVD neovascularization of the disc; NVE neovascularization elsewhere; AREDS age related eye disease study; ARMD age related macular degeneration; POAG primary open angle glaucoma; EBMD epithelial/anterior basement membrane dystrophy; ACIOL anterior chamber intraocular lens; IOL intraocular lens; PCIOL posterior chamber intraocular lens; Phaco/IOL phacoemulsification with intraocular lens placement; PRK photorefractive keratectomy; LASIK laser assisted in situ keratomileusis; HTN hypertension; DM diabetes mellitus; COPD chronic obstructive pulmonary disease

## 2023-11-12 ENCOUNTER — Encounter (INDEPENDENT_AMBULATORY_CARE_PROVIDER_SITE_OTHER): Payer: Self-pay | Admitting: Ophthalmology

## 2023-11-12 ENCOUNTER — Ambulatory Visit (INDEPENDENT_AMBULATORY_CARE_PROVIDER_SITE_OTHER): Admitting: Ophthalmology

## 2023-11-12 DIAGNOSIS — Z7984 Long term (current) use of oral hypoglycemic drugs: Secondary | ICD-10-CM

## 2023-11-12 DIAGNOSIS — H25813 Combined forms of age-related cataract, bilateral: Secondary | ICD-10-CM

## 2023-11-12 DIAGNOSIS — I10 Essential (primary) hypertension: Secondary | ICD-10-CM

## 2023-11-12 DIAGNOSIS — H35033 Hypertensive retinopathy, bilateral: Secondary | ICD-10-CM

## 2023-11-12 DIAGNOSIS — E119 Type 2 diabetes mellitus without complications: Secondary | ICD-10-CM

## 2023-11-12 DIAGNOSIS — E113313 Type 2 diabetes mellitus with moderate nonproliferative diabetic retinopathy with macular edema, bilateral: Secondary | ICD-10-CM

## 2023-11-12 DIAGNOSIS — Z794 Long term (current) use of insulin: Secondary | ICD-10-CM | POA: Diagnosis not present

## 2023-11-12 MED ORDER — AFLIBERCEPT 2MG/0.05ML IZ SOLN FOR KALEIDOSCOPE
2.0000 mg | INTRAVITREAL | Status: AC | PRN
  Administered 2023-11-12: 2 mg via INTRAVITREAL

## 2023-12-14 ENCOUNTER — Ambulatory Visit (INDEPENDENT_AMBULATORY_CARE_PROVIDER_SITE_OTHER): Admitting: Cardiology

## 2023-12-14 ENCOUNTER — Encounter (HOSPITAL_BASED_OUTPATIENT_CLINIC_OR_DEPARTMENT_OTHER): Payer: Self-pay | Admitting: Cardiology

## 2023-12-14 VITALS — BP 130/76 | HR 64 | Ht 69.0 in | Wt 376.5 lb

## 2023-12-14 DIAGNOSIS — I10 Essential (primary) hypertension: Secondary | ICD-10-CM

## 2023-12-14 DIAGNOSIS — I6523 Occlusion and stenosis of bilateral carotid arteries: Secondary | ICD-10-CM | POA: Diagnosis not present

## 2023-12-14 DIAGNOSIS — E11319 Type 2 diabetes mellitus with unspecified diabetic retinopathy without macular edema: Secondary | ICD-10-CM

## 2023-12-14 DIAGNOSIS — I878 Other specified disorders of veins: Secondary | ICD-10-CM | POA: Diagnosis not present

## 2023-12-14 NOTE — Progress Notes (Signed)
 Cardiology Office Note:  .   Date:  12/14/2023  ID:  [name], DOB [birth year], MRN [id number] PCP: Macarthur Elouise SQUIBB, DO  [facility] Providers Cardiologist:  Shelda Bruckner, MD {  History of Present Illness: .   Ryan Jones is a 68 y.o. male with PMH atrial flutter s/p ablation 2012, carotid stenosis, hypertension, morbid obesity, type II diabetes, hyperlipidemia. He was previously seen by Dr. Pietro and established care with me on 12/14/23.  Pertinent CV history: seen by Dr. Pietro in 2023 for LE edema. Amlodipine  stopped, hydrochlorothiazide  changed to lasix , carvedilol  added. Echo with EF 55-60%, G1DD, no significant valve disease though difficult windows. No follow up after initial visit.  PCP notes reviewed; of note, trialed on Mounjaro but had worsening diabetic retinopathy  Today: Overall feels like he is doing well. Had ablation in 2012, was told he may have intermittent palpitations, and he does. Feels a quiver in his chest on occasion, lasts only a few seconds.  Has chronic venous stasis appearing changes. Notes improvement with elevation. Couldn't find compression stockings that fit. Reviewed management of this.  Drinks a gallon or more of water/day.   Has a recumbent bike, working to increase his activity. Up to about 26-27 minutes, no chest tightness with this. Constantly in the yard working, no limitations.   Did a sleep study in the past, told he had sleep apnea but no longer wears CPAP as it caused sinus issues. Feels he does not have issues with sleep.  ROS: Denies chest pain, shortness of breath at rest or with normal exertion. No PND, orthopnea, or unexpected weight gain. No syncope. ROS otherwise negative except as noted.   Studies Reviewed: SABRA    EKG:  EKG Interpretation Date/Time:  Tuesday December 14 2023 09:28:12 EDT Ventricular Rate:  64 PR Interval:  176 QRS Duration:  90 QT Interval:  392 QTC Calculation: 404 R  Axis:   -22  Text Interpretation: Sinus rhythm with marked sinus arrhythmia When compared with ECG of 11-Jun-2011 14:00, No significant change was found Confirmed by Bruckner Shelda [phone]) on 12/14/2023 10:05:42 AM    Physical Exam:   VS:  BP 130/76   Pulse 64   Ht 5' 9 (1.753 m)   Wt (!) 376 lb 8 oz (170.8 kg)   SpO2 97%   BMI 55.60 kg/m    Wt Readings from Last 3 Encounters:  12/14/23 (!) 376 lb 8 oz (170.8 kg)  08/22/21 (!) 367 lb (166.5 kg)  06/13/21 (!) 374 lb 3.2 oz (169.7 kg)    GEN: Well nourished, well developed in no acute distress HEENT: Normal, moist mucous membranes NECK: No JVD CARDIAC: regular rhythm, normal S1 and S2, no rubs or gallops. No murmur. VASCULAR: Radial pulses 2+ bilaterally. Bilateral carotid bruits RESPIRATORY:  Clear to auscultation without rales, wheezing or rhonchi  ABDOMEN: Soft, non-tender, non-distended MUSCULOSKELETAL:  Ambulates independently SKIN: Warm and dry, bilateral nonpitting edema with chronic appearing skin changes NEUROLOGIC:  Alert and oriented x 3. No focal neuro deficits noted. PSYCHIATRIC:  Normal affect    ASSESSMENT AND PLAN: .    Hypertension -on amlodipine , hydrochlorothiazide , losartan  daily -not on lasix , corrected. States he could not take this, unclear why -was on carvedilol  in the past but no longer on, he is not sure why  Chronic venous stasis changes -reviewed elevation, salt avoidance -has not tolerated traditional compression stockings. Gave information for custom fitting  Type II diabetes, on insulin, with retinopathy Morbid obesity,  BMI 55 Carotid stenosis, last done 2022 Hypercholesterolemia -did not tolerate GLP as this worsened his retinopathy (tried both Ozempic and Mounjaro) -on pravastatin for primary prevention -would consider alternative to pioglitazone  if able to minimize fluid retention, defer to his endocrinologist -with history of bleeding in his eye, ok to avoid aspirin  -reviewed  lipids from 08/25/23: Tchol 107, TG 132, HDL 41, LDL 44. With diabetes, LDL goal <55 -bilateral bruits today, will repeat carotid u/s  CV risk counseling and prevention -recommend heart healthy/Mediterranean diet, with whole grains, fruits, vegetable, fish, lean meats, nuts, and olive oil. Limit salt. -recommend moderate walking, 3-5 times/week for 30-50 minutes each session. Aim for at least 150 minutes/week. Goal should be pace of 3 miles/hours, or walking 1.5 miles in 30 minutes -recommend avoidance of tobacco products. Avoid excess alcohol.  Dispo: 1 year or sooner as needed  Signed, Shelda Bruckner, MD   Shelda Bruckner, MD, PhD, [facility]  [facility]  [place]  [facility] at [facility] [address] [place], [place] [address] [phone]

## 2023-12-14 NOTE — Patient Instructions (Addendum)
 Medication Instructions:  Your physician recommends that you continue on your current medications as directed. Please refer to the Current Medication list given to you today.   Labwork: NONE  Testing/Procedures: Your physician has requested that you have a carotid duplex. This test is an ultrasound of the carotid arteries in your neck. It looks at blood flow through these arteries that supply the brain with blood. Allow one hour for this exam. There are no restrictions or special instructions.  Follow-Up: Your physician wants you to follow-up in: 1 YEAR WITH DR CHRISTOPHER, CAITLIN W NP, OR MICHELLE S NP You will receive a reminder letter in the mail two months in advance. If you don't receive a letter, please call our office to schedule the follow-up appointment.  Any Other Special Instructions Will Be Listed Below (If Applicable).     If you need a refill on your cardiac medications before your next appointment, please call your pharmacy.  Look into [employer] for fitted compression stockings: [address] [place], [place], [place] [address] [phone] [email or web address]  Degrees of compression: Mild (8-15 mmHg or 10-15 mmHg): Provides light support, often recommended for tired legs, travel, or minor swelling.   Medium (15-20 mmHg or 20-30 mmHg): Offers more support than mild, suitable for everyday wear, leg fatigue, and mild swelling.   Firm (20-30 mmHg or 30-40 mmHg): Used for varicose veins, venous issues, and swelling during pregnancy, and sometimes prescribed for post-surgery recovery.   If you are sensitive to compression stockings, start with the mild and if that is tolerated, aim for the moderate compression. If the moderate compression is well tolerated and works well for swelling, you can stay there; if the moderate doesn't manage the swelling, then you can try to go up to firm.

## 2023-12-15 NOTE — Progress Notes (Signed)
 [facility] - Clinic Note  12/17/2023   CHIEF COMPLAINT Patient presents for Retina Follow Up  HISTORY OF PRESENT ILLNESS: Ryan Jones is a 68 y.o. male who presents to the clinic today for:  HPI     Retina Follow Up   Patient presents with  Diabetic Retinopathy.  In both eyes.  This started 5 weeks ago.  I, the attending physician,  performed the HPI with the patient and updated documentation appropriately.        Comments   Patient here for 5 weeks retina follow up for NPDR OU. Patient states vision doing pretty good. Had a little allergy eyes this am. Used allergy drops. No eye pain. Uses Refresh prn.      Last edited by Valdemar Rogue, MD on 12/18/2023  1:26 AM.     Patient feels the vision is doing well and is the same.   Referring physician: [name], MD [address] [place],  [place] [address]  HISTORICAL INFORMATION:  Selected notes from the MEDICAL RECORD NUMBER Referred by Dr. [name] for DME OU LEE:  Ocular Hx- PMH-   CURRENT MEDICATIONS: No current outpatient medications on file. (Ophthalmic Drugs)   No current facility-administered medications for this visit. (Ophthalmic Drugs)   Current Outpatient Medications (Other)  Medication Sig   albuterol  (PROVENTIL  HFA;VENTOLIN  HFA) 108 (90 Base) MCG/ACT inhaler Inhale 2 puffs into the lungs every 6 (six) hours as needed. For shortness of breath.   amLODipine  (NORVASC ) 10 MG tablet Take 10 mg by mouth in the morning.   EPINEPHrine  0.3 mg/0.3 mL IJ SOAJ injection Inject 0.3 mg into the muscle as needed for anaphylaxis.   glipiZIDE  (GLUCOTROL ) 5 MG tablet Take 5 mg by mouth in the morning.   hydrochlorothiazide  (HYDRODIURIL ) 25 MG tablet Take 25 mg by mouth in the morning.   ibuprofen (ADVIL,MOTRIN) 800 MG tablet Take 800 mg by mouth every 8 (eight) hours as needed (for pain.).    indomethacin  (INDOCIN ) 50 MG capsule TAKE 1 CAPSULE BY MOUTH THREE TIMES DAILY AS NEEDED FOR MODERATE PAIN  (GOUT) (Patient taking differently: Take 50 mg by mouth as needed.)   losartan  (COZAAR ) 100 MG tablet TAKE 1 TABLET BY MOUTH EVERY DAY   metFORMIN  (GLUCOPHAGE ) 1000 MG tablet Take 1 tablet (1,000 mg total) by mouth 2 (two) times daily with a meal.   mometasone  (NASONEX ) 50 MCG/ACT nasal spray 2 sprays each nares once daily (Patient taking differently: as needed. 2 sprays each nares once daily)   Multiple Vitamin (MULTIVITAMIN WITH MINERALS) TABS tablet Take 1 tablet by mouth in the morning.   omeprazole  (PRILOSEC) 40 MG capsule Take 40 mg by mouth daily.   pioglitazone  (ACTOS ) 30 MG tablet Take 30 mg by mouth daily at 12 noon.    pravastatin (PRAVACHOL) 20 MG tablet Take 20 mg by mouth in the morning.   tamsulosin (FLOMAX) 0.4 MG CAPS capsule Take 0.4 mg by mouth every evening.   TRESIBA FLEXTOUCH 200 UNIT/ML FlexTouch Pen Inject 35 Units into the skin in the morning. (Patient taking differently: Inject 46 Units into the skin in the morning.)   UNABLE TO FIND Inject 1 Dose as directed every 14 (fourteen) days. Allergy shots Bowler dr altamease   No current facility-administered medications for this visit. (Other)   REVIEW OF SYSTEMS: ROS   Positive for: Endocrine, Cardiovascular, Eyes Last edited by Orval Asberry RAMAN, COA on 12/17/2023  9:42 AM.  ALLERGIES Allergies  Allergen Reactions   Augmentin  [Amoxicillin -Pot Clavulanate] Other (See Comments)    Thrush and yeast infection Has patient had a PCN reaction causing immediate rash, facial/tongue/throat swelling, SOB or lightheadedness with hypotension: No Has patient had a PCN reaction causing severe rash involving mucus membranes or skin necrosis: No Has patient had a PCN reaction that required hospitalization: No Has patient had a PCN reaction occurring within the last 10 years: No If all of the above answers are NO, then may proceed with Cephalosporin use.     Codeine Itching   PAST MEDICAL HISTORY Past Medical  History:  Diagnosis Date   Arthritis    Atrial flutter (HCC) 02/26/2011   s/p RFCA 02/2011   Bronchitis    h/o recurrent bronchitis; usually get it 1-2X/wy; last time 12/2010   Cancer [facility]) 2011   right ear,basal   Constipation    Diabetes mellitus    pills   Diarrhea    GERD (gastroesophageal reflux disease)    Gout    get it in my hands and feet   Headache    sinus headache   Hemorrhoids    Hyperlipidemia    Hypertension    Kidney stone    1999   Seasonal allergies 02/26/2011   I take an allergy shot q week   Sleep apnea    no cpap used much since nasal surgery yrs ago   Past Surgical History:  Procedure Laterality Date   ATRIAL FLUTTER ABLATION N/A 02/26/2011   Procedure: ATRIAL FLUTTER ABLATION;  Surgeon: Elspeth JAYSON Sage, MD;  Location: [facility] CATH LAB;  Service: Cardiovascular;  Laterality: N/A;   BACK SURGERY  before 1995 and in 1995;    ruptured disc repaired; lower back   CARDIAC ELECTROPHYSIOLOGY STUDY AND ABLATION  02/26/2011   for atrial fib   COLONOSCOPY WITH PROPOFOL  N/A 01/18/2015   Procedure: COLONOSCOPY WITH PROPOFOL ;  Surgeon: Belvie Just, MD;  Location: [facility] ENDOS[facility];  Service: Endoscopy;  Laterality: N/A;   COLONOSCOPY WITH PROPOFOL  N/A 02/04/2018   Procedure: COLONOSCOPY WITH PROPOFOL ;  Surgeon: Just Belvie, MD;  Location: [facility];  Service: Endoscopy;  Laterality: N/A;   COLONOSCOPY WITH PROPOFOL  N/A 08/22/2021   Procedure: COLONOSCOPY WITH PROPOFOL ;  Surgeon: Just Belvie, MD;  Location: [facility];  Service: Endoscopy;  Laterality: N/A;   ESOPHAGOGASTRODUODENOSCOPY (EGD) WITH PROPOFOL  N/A 08/22/2021   Procedure: ESOPHAGOGASTRODUODENOSCOPY (EGD) WITH PROPOFOL ;  Surgeon: Just Belvie, MD;  Location: [facility];  Service: Endoscopy;  Laterality: N/A;   LUMBAR DISC SURGERY  ~ 1993   put foam in back; in place of disc   LUMBAR DISC SURGERY  04/1993   replaced gel foam that had blown out   nasal septum surgery to open up sinuses  yrs ago    dr lazaro   POLYPECTOMY  02/04/2018   Procedure: POLYPECTOMY;  Surgeon: Just Belvie, MD;  Location: [facility];  Service: Endoscopy;;   POLYPECTOMY  08/22/2021   Procedure: POLYPECTOMY;  Surgeon: Just Belvie, MD;  Location: [facility];  Service: Endoscopy;;   SKIN CANCER EXCISION  2011   right ear   squamous cell area removed from right arm  6 weeks ago   FAMILY HISTORY Family History  Problem Relation Age of Onset   Heart attack Father    SOCIAL HISTORY Social History   Tobacco Use   Smoking status: Never   Smokeless tobacco: Current    Types: Snuff   Tobacco comments:    stopped cigars ~ 2002  Vaping  Use   Vaping status: Never Used  Substance Use Topics   Alcohol use: No   Drug use: No       OPHTHALMIC EXAM:  Base Eye Exam     Visual Acuity (Snellen - Linear)       Right Left   Dist cc 20/50 20/30 -1   Dist ph cc 20/30 NI    Correction: Glasses         Tonometry (Tonopen, 9:39 AM)       Right Left   Pressure 13 13         Pupils       Dark Light Shape React APD   Right 3 2 Round Brisk None   Left 3 2 Round Brisk None         Visual Fields (Counting fingers)       Left Right    Full Full         Extraocular Movement       Right Left    Full, Ortho Full, Ortho         Neuro/Psych     Oriented x3: Yes   Mood/Affect: Normal         Dilation     Both eyes: 1.0% Mydriacyl, 2.5% Phenylephrine  @ 9:39 AM           Slit Lamp and Fundus Exam     External Exam       Right Left   External Normal Normal         Slit Lamp Exam       Right Left   Lids/Lashes Dermatochalasis - upper lid Dermatochalasis - upper lid   Conjunctiva/Sclera Nasal and temporal Pinguecula Nasal and temporal Pinguecula   Cornea Debris in tear film Debris in tear film   Anterior Chamber Deep and clear Deep and clear   Iris Round and dilated, No NVI Round and dilated, No NVI   Lens 1-2+ Nuclear sclerosis, 1-2+ Cortical cataract 1-2+  Nuclear sclerosis, 1-2+ Cortical cataract   Anterior Vitreous Vitreous syneresis Vitreous syneresis         Fundus Exam       Right Left   Disc Pink and sharp, mild PPA Pink and sharp, mild PPA   C/D Ratio 0.4 0.5   Macula Flat, Blunted foveal reflex, focal MA/edema inferiorly --slightly improved Flat, Blunted foveal reflex, focal MA/DBH and edema -slightly improved   Vessels Vascular attenuation, Telangiectasia, no NV Vascular attenuation, Telangiectasia, no NV   Periphery Attached, scattered MA/DBH greatest posteriorly Attached, scattered MA/DBH greatest posteriorly.           Refraction     Wearing Rx       Sphere Cylinder Axis Add   Right -1.00 +1.25 003 +2.75   Left -0.75 +1.00 180 +2.75           IMAGING AND PROCEDURES  Imaging and Procedures for 12/17/2023  OCT, Retina - OU - Both Eyes       Right Eye Quality was good. Central Foveal Thickness: 430. Progression has improved. Findings include no SRF, abnormal foveal contour, intraretinal hyper-reflective material, intraretinal fluid, vitreomacular adhesion (Persistent IRF/IRHM inferior fovea and macula--slightly improved).   Left Eye Quality was good. Central Foveal Thickness: 331. Progression has improved. Findings include no SRF, abnormal foveal contour, intraretinal hyper-reflective material, intraretinal fluid, vitreomacular adhesion (Persistent +IRF/IRHM temporal fovea and macula ).   Notes *Images captured and stored on drive  Diagnosis / Impression:  +DME OU  OD:  Persistent IRF/IRHM inferior fovea and macula--slightly improved OS: Persistent IRF/IRHM temporal  fovea and macula  Clinical management:  See below  Abbreviations: NFP - Normal foveal profile. CME - cystoid macular edema. PED - pigment epithelial detachment. IRF - intraretinal fluid. SRF - subretinal fluid. EZ - ellipsoid zone. ERM - epiretinal membrane. ORA - outer retinal atrophy. ORT - outer retinal tubulation. SRHM - subretinal  hyper-reflective material. IRHM - intraretinal hyper-reflective material      Intravitreal Injection, Pharmacologic Agent - OD - Right Eye       Time Out 12/17/2023. 10:41 AM. Confirmed correct patient, procedure, site, and patient consented.   Anesthesia Topical anesthesia was used. Anesthetic medications included Lidocaine  2%, Proparacaine 0.5%.   Procedure Preparation included 5% betadine to ocular surface, eyelid speculum. A (32g) needle was used.   Injection: 2 mg aflibercept  2 MG/0.05ML   Route: Intravitreal, Site: Right Eye   NDC: D2246706, Lot: [id number], Expiration date: 02/17/2025, Waste: 0 mL   Post-op Post injection exam found visual acuity of at least counting fingers. The patient tolerated the procedure well. There were no complications. The patient received written and verbal post procedure care education.   Notes       Intravitreal Injection, Pharmacologic Agent - OS - Left Eye       Time Out 12/17/2023. 10:42 AM. Confirmed correct patient, procedure, site, and patient consented.   Anesthesia Topical anesthesia was used. Anesthetic medications included Lidocaine  2%, Proparacaine 0.5%.   Procedure Preparation included 5% betadine to ocular surface, eyelid speculum. A (32g) needle was used.   Injection: 2 mg aflibercept  2 MG/0.05ML   Route: Intravitreal, Site: Left Eye   NDC: D2246706, Lot: [id number], Expiration date: 02/17/2025, Waste: 0 mL   Post-op Post injection exam found visual acuity of at least counting fingers. The patient tolerated the procedure well. There were no complications. The patient received written and verbal post procedure care education.           ASSESSMENT/PLAN:   ICD-10-CM   1. Moderate nonproliferative diabetic retinopathy of both eyes with macular edema associated with type 2 diabetes mellitus (HCC)  E11.3313 OCT, Retina - OU - Both Eyes    Intravitreal Injection, Pharmacologic Agent - OD - Right Eye     Intravitreal Injection, Pharmacologic Agent - OS - Left Eye    aflibercept  (EYLEA ) SOLN 2 mg    aflibercept  (EYLEA ) SOLN 2 mg    2. Current use of insulin (HCC)  Z79.4     3. Diabetes mellitus treated with oral medication (HCC)  E11.9    Z79.84     4. Essential hypertension  I10     5. Hypertensive retinopathy of both eyes  H35.033     6. Combined forms of age-related cataract of both eyes  H25.813      1-3. Moderate Non-proliferative diabetic retinopathy w/ DME, both eyes  - lost to f/u from 08.26.24 to 05.14.25 (8.5 mos)  - most recent A1c 6.9% on 05.21.25 - FA (05.29.24) + Mod NPDR OU, shows OD: Scattered leaking MA greatest inferior macula, no NV, OS; Scattered leaking MA greatest temporal macula, no NV - s/p IVA OD #1 (05.29.24), #2 (07.01.24), #3 (07.29.24), #4 (08.26.24), #5 (05.14.25) - s/p IVA OS #1 (07.01.24), #2 (07.29.24), #3 (08.26.24), #4 (05.14.25)  **IVA resistance** ===========================  - s/p IVE OU #1 (06.20.2025) #2 (06.20.25) - BCVA 20/30 OU - OCT shows +DME OU, OD: Persistent IRF/IRHM inferior fovea and macula--slightly improved; OS: Persistent IRF/IRHM temporal  fovea and  macula at 5 weeks - recommend IVE OU #3 today, 08.29.25 w/ f/u in 5 wks - pt wishes to proceed - RBA of procedure discussed, questions answered  - IVE informed consent obtained and signed OU 06.20.25 - see procedure note - Eylea  approved through Medicare and [employer] insurance - f/u in 5 wks -- DFE/OCT, possible injection   4,5. Hypertensive retinopathy OU - discussed importance of tight BP control - monitor  6. Mixed Cataract OU - The symptoms of cataract, surgical options, and treatments and risks were discussed with patient. - discussed diagnosis and progression - monitor  Ophthalmic Meds Ordered this visit:  Meds ordered this encounter  Medications   aflibercept  (EYLEA ) SOLN 2 mg   aflibercept  (EYLEA ) SOLN 2 mg     Return in about 5 weeks (around 01/21/2024) for f/u  NPDR OU , DFE, OCT, Possible, IVE, OU.  There are no Patient Instructions on file for this visit.  This document serves as a record of services personally performed by Redell JUDITHANN Hans, MD, PhD. It was created on their behalf by Delon Newness COT, an ophthalmic technician. The creation of this record is the provider's dictation and/or activities during the visit.    Electronically signed by: Delon Newness COT 08.27.2025 1:36 AM  This document serves as a record of services personally performed by Redell JUDITHANN Hans, MD, PhD. It was created on their behalf by Wanda GEANNIE Keens, COT an ophthalmic technician. The creation of this record is the provider's dictation and/or activities during the visit.    Electronically signed by:  Wanda GEANNIE Keens, COT  12/18/23 1:36 AM  Redell JUDITHANN Hans, M.D., Ph.D. Diseases & Surgery of the Retina and Vitreous [facility] 12/17/2023   I have reviewed the above documentation for accuracy and completeness, and I agree with the above. Redell JUDITHANN Hans, M.D., Ph.D. 12/18/23 1:36 AM   Abbreviations: M myopia (nearsighted); A astigmatism; H hyperopia (farsighted); P presbyopia; Mrx spectacle prescription;  CTL contact lenses; OD right eye; OS left eye; OU both eyes  XT exotropia; ET esotropia; PEK punctate epithelial keratitis; PEE punctate epithelial erosions; DES dry eye syndrome; MGD meibomian gland dysfunction; ATs artificial tears; PFAT's preservative free artificial tears; NSC nuclear sclerotic cataract; PSC posterior subcapsular cataract; ERM epi-retinal membrane; PVD posterior vitreous detachment; RD retinal detachment; DM diabetes mellitus; DR diabetic retinopathy; NPDR non-proliferative diabetic retinopathy; PDR proliferative diabetic retinopathy; CSME clinically significant macular edema; DME diabetic macular edema; dbh dot blot hemorrhages; CWS cotton wool spot; POAG primary open angle glaucoma; C/D cup-to-disc ratio; HVF  humphrey visual field; GVF goldmann visual field; OCT optical coherence tomography; IOP intraocular pressure; BRVO Branch retinal vein occlusion; CRVO central retinal vein occlusion; CRAO central retinal artery occlusion; BRAO branch retinal artery occlusion; RT retinal tear; SB scleral buckle; PPV pars plana vitrectomy; VH Vitreous hemorrhage; PRP panretinal laser photocoagulation; IVK intravitreal kenalog; VMT vitreomacular traction; MH Macular hole;  NVD neovascularization of the disc; NVE neovascularization elsewhere; AREDS age related eye disease study; ARMD age related macular degeneration; POAG primary open angle glaucoma; EBMD epithelial/anterior basement membrane dystrophy; ACIOL anterior chamber intraocular lens; IOL intraocular lens; PCIOL posterior chamber intraocular lens; Phaco/IOL phacoemulsification with intraocular lens placement; PRK photorefractive keratectomy; LASIK laser assisted in situ keratomileusis; HTN hypertension; DM diabetes mellitus; COPD chronic obstructive pulmonary disease

## 2023-12-17 ENCOUNTER — Ambulatory Visit (INDEPENDENT_AMBULATORY_CARE_PROVIDER_SITE_OTHER): Admitting: Ophthalmology

## 2023-12-17 ENCOUNTER — Encounter (INDEPENDENT_AMBULATORY_CARE_PROVIDER_SITE_OTHER): Payer: Self-pay | Admitting: Ophthalmology

## 2023-12-17 DIAGNOSIS — E113313 Type 2 diabetes mellitus with moderate nonproliferative diabetic retinopathy with macular edema, bilateral: Secondary | ICD-10-CM

## 2023-12-17 DIAGNOSIS — Z794 Long term (current) use of insulin: Secondary | ICD-10-CM | POA: Diagnosis not present

## 2023-12-17 DIAGNOSIS — I10 Essential (primary) hypertension: Secondary | ICD-10-CM

## 2023-12-17 DIAGNOSIS — Z7984 Long term (current) use of oral hypoglycemic drugs: Secondary | ICD-10-CM | POA: Diagnosis not present

## 2023-12-17 DIAGNOSIS — H35033 Hypertensive retinopathy, bilateral: Secondary | ICD-10-CM

## 2023-12-17 DIAGNOSIS — E119 Type 2 diabetes mellitus without complications: Secondary | ICD-10-CM

## 2023-12-17 DIAGNOSIS — H25813 Combined forms of age-related cataract, bilateral: Secondary | ICD-10-CM

## 2023-12-18 ENCOUNTER — Encounter (INDEPENDENT_AMBULATORY_CARE_PROVIDER_SITE_OTHER): Payer: Self-pay | Admitting: Ophthalmology

## 2023-12-18 MED ORDER — AFLIBERCEPT 2MG/0.05ML IZ SOLN FOR KALEIDOSCOPE
2.0000 mg | INTRAVITREAL | Status: AC | PRN
Start: 2023-12-18 — End: 2023-12-18
  Administered 2023-12-18: 2 mg via INTRAVITREAL

## 2023-12-18 MED ORDER — AFLIBERCEPT 2MG/0.05ML IZ SOLN FOR KALEIDOSCOPE
2.0000 mg | INTRAVITREAL | Status: AC | PRN
  Administered 2023-12-18: 2 mg via INTRAVITREAL

## 2024-01-07 ENCOUNTER — Encounter (HOSPITAL_BASED_OUTPATIENT_CLINIC_OR_DEPARTMENT_OTHER)

## 2024-01-19 NOTE — Progress Notes (Shared)
 [facility] - Clinic Note  01/20/2024   CHIEF COMPLAINT Patient presents for No chief complaint on file.  HISTORY OF PRESENT ILLNESS: Ryan Jones is a 68 y.o. male who presents to the clinic today for:    Patient feels the vision is doing well and is the same.   Referring physician: [name], MD [address] [place],  [place] [address]  HISTORICAL INFORMATION:  Selected notes from the MEDICAL RECORD NUMBER Referred by Dr. [name] for DME OU LEE:  Ocular Hx- PMH-   CURRENT MEDICATIONS: No current outpatient medications on file. (Ophthalmic Drugs)   No current facility-administered medications for this visit. (Ophthalmic Drugs)   Current Outpatient Medications (Other)  Medication Sig   albuterol  (PROVENTIL  HFA;VENTOLIN  HFA) 108 (90 Base) MCG/ACT inhaler Inhale 2 puffs into the lungs every 6 (six) hours as needed. For shortness of breath.   amLODipine  (NORVASC ) 10 MG tablet Take 10 mg by mouth in the morning.   EPINEPHrine  0.3 mg/0.3 mL IJ SOAJ injection Inject 0.3 mg into the muscle as needed for anaphylaxis.   glipiZIDE  (GLUCOTROL ) 5 MG tablet Take 5 mg by mouth in the morning.   hydrochlorothiazide  (HYDRODIURIL ) 25 MG tablet Take 25 mg by mouth in the morning.   ibuprofen (ADVIL,MOTRIN) 800 MG tablet Take 800 mg by mouth every 8 (eight) hours as needed (for pain.).    indomethacin  (INDOCIN ) 50 MG capsule TAKE 1 CAPSULE BY MOUTH THREE TIMES DAILY AS NEEDED FOR MODERATE PAIN (GOUT) (Patient taking differently: Take 50 mg by mouth as needed.)   losartan  (COZAAR ) 100 MG tablet TAKE 1 TABLET BY MOUTH EVERY DAY   metFORMIN  (GLUCOPHAGE ) 1000 MG tablet Take 1 tablet (1,000 mg total) by mouth 2 (two) times daily with a meal.   mometasone  (NASONEX ) 50 MCG/ACT nasal spray 2 sprays each nares once daily (Patient taking differently: as needed. 2 sprays each nares once daily)   Multiple Vitamin (MULTIVITAMIN WITH MINERALS) TABS tablet Take 1 tablet by mouth  in the morning.   omeprazole  (PRILOSEC) 40 MG capsule Take 40 mg by mouth daily.   pioglitazone  (ACTOS ) 30 MG tablet Take 30 mg by mouth daily at 12 noon.    pravastatin (PRAVACHOL) 20 MG tablet Take 20 mg by mouth in the morning.   tamsulosin (FLOMAX) 0.4 MG CAPS capsule Take 0.4 mg by mouth every evening.   TRESIBA FLEXTOUCH 200 UNIT/ML FlexTouch Pen Inject 35 Units into the skin in the morning. (Patient taking differently: Inject 46 Units into the skin in the morning.)   UNABLE TO FIND Inject 1 Dose as directed every 14 (fourteen) days. Allergy shots [place] altamease   No current facility-administered medications for this visit. (Other)   REVIEW OF SYSTEMS:      ALLERGIES Allergies  Allergen Reactions   Augmentin  [Amoxicillin -Pot Clavulanate] Other (See Comments)    Thrush and yeast infection Has patient had a PCN reaction causing immediate rash, facial/tongue/throat swelling, SOB or lightheadedness with hypotension: No Has patient had a PCN reaction causing severe rash involving mucus membranes or skin necrosis: No Has patient had a PCN reaction that required hospitalization: No Has patient had a PCN reaction occurring within the last 10 years: No If all of the above answers are NO, then may proceed with Cephalosporin use.     Codeine Itching   PAST MEDICAL HISTORY Past Medical History:  Diagnosis Date   Arthritis    Atrial flutter (HCC) 02/26/2011   s/p RFCA 02/2011  Bronchitis    h/o recurrent bronchitis; usually get it 1-2X/wy; last time 12/2010   Cancer [facility]) 2011   right ear,basal   Constipation    Diabetes mellitus    pills   Diarrhea    GERD (gastroesophageal reflux disease)    Gout    get it in my hands and feet   Headache    sinus headache   Hemorrhoids    Hyperlipidemia    Hypertension    Kidney stone    1999   Seasonal allergies 02/26/2011   I take an allergy shot q week   Sleep apnea    no cpap used much since nasal surgery yrs  ago   Past Surgical History:  Procedure Laterality Date   ATRIAL FLUTTER ABLATION N/A 02/26/2011   Procedure: ATRIAL FLUTTER ABLATION;  Surgeon: Elspeth JAYSON Sage, MD;  Location: [facility] CATH LAB;  Service: Cardiovascular;  Laterality: N/A;   BACK SURGERY  before 1995 and in 1995;    ruptured disc repaired; lower back   CARDIAC ELECTROPHYSIOLOGY STUDY AND ABLATION  02/26/2011   for atrial fib   COLONOSCOPY WITH PROPOFOL  N/A 01/18/2015   Procedure: COLONOSCOPY WITH PROPOFOL ;  Surgeon: Belvie Just, MD;  Location: [facility] ENDOSCOPY;  Service: Endoscopy;  Laterality: N/A;   COLONOSCOPY WITH PROPOFOL  N/A 02/04/2018   Procedure: COLONOSCOPY WITH PROPOFOL ;  Surgeon: Just Belvie, MD;  Location: [facility];  Service: Endoscopy;  Laterality: N/A;   COLONOSCOPY WITH PROPOFOL  N/A 08/22/2021   Procedure: COLONOSCOPY WITH PROPOFOL ;  Surgeon: Just Belvie, MD;  Location: [facility];  Service: Endoscopy;  Laterality: N/A;   ESOPHAGOGASTRODUODENOSCOPY (EGD) WITH PROPOFOL  N/A 08/22/2021   Procedure: ESOPHAGOGASTRODUODENOSCOPY (EGD) WITH PROPOFOL ;  Surgeon: Just Belvie, MD;  Location: [facility];  Service: Endoscopy;  Laterality: N/A;   LUMBAR DISC SURGERY  ~ 1993   put foam in back; in place of disc   LUMBAR DISC SURGERY  04/1993   replaced gel foam that had blown out   nasal septum surgery to open up sinuses  yrs ago   dr lazaro   POLYPECTOMY  02/04/2018   Procedure: POLYPECTOMY;  Surgeon: Just Belvie, MD;  Location: [facility];  Service: Endoscopy;;   POLYPECTOMY  08/22/2021   Procedure: POLYPECTOMY;  Surgeon: Just Belvie, MD;  Location: [facility];  Service: Endoscopy;;   SKIN CANCER EXCISION  2011   right ear   squamous cell area removed from right arm  6 weeks ago   FAMILY HISTORY Family History  Problem Relation Age of Onset   Heart attack Father    SOCIAL HISTORY Social History   Tobacco Use   Smoking status: Never   Smokeless tobacco: Current    Types: Snuff   Tobacco comments:     stopped cigars ~ 2002  Vaping Use   Vaping status: Never Used  Substance Use Topics   Alcohol use: No   Drug use: No       OPHTHALMIC EXAM:  Not recorded    IMAGING AND PROCEDURES  Imaging and Procedures for 01/20/2024         ASSESSMENT/PLAN:   ICD-10-CM   1. Moderate nonproliferative diabetic retinopathy of both eyes with macular edema associated with type 2 diabetes mellitus (HCC)  Z88.6686     2. Current use of insulin (HCC)  Z79.4     3. Diabetes mellitus treated with oral medication (HCC)  E11.9    Z79.84     4. Essential hypertension  I10     5. Hypertensive retinopathy  of both eyes  H35.033     6. Combined forms of age-related cataract of both eyes  H25.813       1-3. Moderate Non-proliferative diabetic retinopathy w/ DME, both eyes  - lost to f/u from 08.26.24 to 05.14.25 (8.5 mos)  - most recent A1c 6.9% on 05.21.25 - FA (05.29.24) + Mod NPDR OU, shows OD: Scattered leaking MA greatest inferior macula, no NV, OS; Scattered leaking MA greatest temporal macula, no NV - s/p IVA OD #1 (05.29.24), #2 (07.01.24), #3 (07.29.24), #4 (08.26.24), #5 (05.14.25) - s/p IVA OS #1 (07.01.24), #2 (07.29.24), #3 (08.26.24), #4 (05.14.25)  **IVA resistance** ===========================  - s/p IVE OU #1 (06.20.2025) #2 (06.20.25), #3 (08.29.25) - BCVA 20/30 OU - OCT shows +DME OU, OD: Persistent IRF/IRHM inferior fovea and macula--slightly improved; OS: Persistent IRF/IRHM temporal  fovea and macula at 5 weeks - recommend IVE OU #4 today, 10.02.25 w/ f/u in 5 wks - pt wishes to proceed - RBA of procedure discussed, questions answered  - IVE informed consent obtained and signed OU 06.20.25 - see procedure note - Eylea  approved through Medicare and [employer] insurance - f/u in 5 wks -- DFE/OCT, possible injection   4,5. Hypertensive retinopathy OU - discussed importance of tight BP control - monitor  6. Mixed Cataract OU - The symptoms of cataract, surgical options,  and treatments and risks were discussed with patient. - discussed diagnosis and progression - monitor  Ophthalmic Meds Ordered this visit:  No orders of the defined types were placed in this encounter.    No follow-ups on file.  There are no Patient Instructions on file for this visit.  This document serves as a record of services personally performed by Redell JUDITHANN Hans, MD, PhD. It was created on their behalf by Auston Muzzy, COMT. The creation of this record is the provider's dictation and/or activities during the visit.  Electronically signed by: Auston Muzzy, COMT 01/19/24 10:42 AM   Redell JUDITHANN Hans, M.D., Ph.D. Diseases & Surgery of the Retina and Vitreous [facility]    Abbreviations: M myopia (nearsighted); A astigmatism; H hyperopia (farsighted); P presbyopia; Mrx spectacle prescription;  CTL contact lenses; OD right eye; OS left eye; OU both eyes  XT exotropia; ET esotropia; PEK punctate epithelial keratitis; PEE punctate epithelial erosions; DES dry eye syndrome; MGD meibomian gland dysfunction; ATs artificial tears; PFAT's preservative free artificial tears; NSC nuclear sclerotic cataract; PSC posterior subcapsular cataract; ERM epi-retinal membrane; PVD posterior vitreous detachment; RD retinal detachment; DM diabetes mellitus; DR diabetic retinopathy; NPDR non-proliferative diabetic retinopathy; PDR proliferative diabetic retinopathy; CSME clinically significant macular edema; DME diabetic macular edema; dbh dot blot hemorrhages; CWS cotton wool spot; POAG primary open angle glaucoma; C/D cup-to-disc ratio; HVF humphrey visual field; GVF goldmann visual field; OCT optical coherence tomography; IOP intraocular pressure; BRVO Branch retinal vein occlusion; CRVO central retinal vein occlusion; CRAO central retinal artery occlusion; BRAO branch retinal artery occlusion; RT retinal tear; SB scleral buckle; PPV pars plana vitrectomy; VH Vitreous hemorrhage; PRP  panretinal laser photocoagulation; IVK intravitreal kenalog; VMT vitreomacular traction; MH Macular hole;  NVD neovascularization of the disc; NVE neovascularization elsewhere; AREDS age related eye disease study; ARMD age related macular degeneration; POAG primary open angle glaucoma; EBMD epithelial/anterior basement membrane dystrophy; ACIOL anterior chamber intraocular lens; IOL intraocular lens; PCIOL posterior chamber intraocular lens; Phaco/IOL phacoemulsification with intraocular lens placement; PRK photorefractive keratectomy; LASIK laser assisted in situ keratomileusis; HTN hypertension; DM diabetes mellitus; COPD chronic obstructive pulmonary  disease

## 2024-01-20 ENCOUNTER — Encounter (INDEPENDENT_AMBULATORY_CARE_PROVIDER_SITE_OTHER): Admitting: Ophthalmology

## 2024-01-28 ENCOUNTER — Ambulatory Visit (INDEPENDENT_AMBULATORY_CARE_PROVIDER_SITE_OTHER)

## 2024-01-28 DIAGNOSIS — I10 Essential (primary) hypertension: Secondary | ICD-10-CM

## 2024-01-28 DIAGNOSIS — I6523 Occlusion and stenosis of bilateral carotid arteries: Secondary | ICD-10-CM | POA: Diagnosis not present

## 2024-02-01 ENCOUNTER — Ambulatory Visit (HOSPITAL_BASED_OUTPATIENT_CLINIC_OR_DEPARTMENT_OTHER): Payer: Self-pay | Admitting: Family

## 2024-02-01 ENCOUNTER — Telehealth: Payer: Self-pay | Admitting: Cardiology

## 2024-02-01 NOTE — Telephone Encounter (Signed)
 Patient is requesting a call back to review carotid results.

## 2024-02-01 NOTE — Telephone Encounter (Signed)
 Vannie Reche RAMAN, NP    02/01/24  4:11 PM FYI results sent to your inbasket and via MyChart _________________________________________________   Patient reviewed the results in MyChart at 4:24 pm.

## 2024-02-24 NOTE — Progress Notes (Signed)
 [facility] - Clinic Note  02/29/2024   CHIEF COMPLAINT Patient presents for Retina Follow Up  HISTORY OF PRESENT ILLNESS: Ryan Jones is a 68 y.o. male who presents to the clinic today for:  HPI     Retina Follow Up   Patient presents with  Diabetic Retinopathy.  In both eyes.  This started 5 weeks ago.  Duration of 5 weeks.  Since onset it is stable.        Comments   5 week retina follow up NPDR OU and I'VE OU pt is reporting vision is little blurred he had some floaters after last injection along with some pain last reading 198 pt saw Dr Patrcia got new glasses and was unable to see out of them had them remade has not got yet       Last edited by Resa Delon ORN, COT on 02/29/2024  8:28 AM.      Patient states he's had a lot of personal issues going on, reason he's delayed. Nothing new health wise. Vision seems ok-had some throbbing pain x 3 days after last injection. Sugar levels have been elevated.   Referring physician: [name], MD [address] [place],  [place] [address]  HISTORICAL INFORMATION:  Selected notes from the MEDICAL RECORD NUMBER Referred by Dr. [name] for DME OU LEE:  Ocular Hx- PMH-   CURRENT MEDICATIONS: No current outpatient medications on file. (Ophthalmic Drugs)   No current facility-administered medications for this visit. (Ophthalmic Drugs)   Current Outpatient Medications (Other)  Medication Sig   albuterol  (PROVENTIL  HFA;VENTOLIN  HFA) 108 (90 Base) MCG/ACT inhaler Inhale 2 puffs into the lungs every 6 (six) hours as needed. For shortness of breath.   amLODipine  (NORVASC ) 10 MG tablet Take 10 mg by mouth in the morning.   EPINEPHrine  0.3 mg/0.3 mL IJ SOAJ injection Inject 0.3 mg into the muscle as needed for anaphylaxis.   glipiZIDE  (GLUCOTROL ) 5 MG tablet Take 5 mg by mouth in the morning.   hydrochlorothiazide  (HYDRODIURIL ) 25 MG tablet Take 25 mg by mouth in the morning.   ibuprofen  (ADVIL,MOTRIN) 800 MG tablet Take 800 mg by mouth every 8 (eight) hours as needed (for pain.).    indomethacin  (INDOCIN ) 50 MG capsule TAKE 1 CAPSULE BY MOUTH THREE TIMES DAILY AS NEEDED FOR MODERATE PAIN (GOUT) (Patient taking differently: Take 50 mg by mouth as needed.)   losartan  (COZAAR ) 100 MG tablet TAKE 1 TABLET BY MOUTH EVERY DAY   metFORMIN  (GLUCOPHAGE ) 1000 MG tablet Take 1 tablet (1,000 mg total) by mouth 2 (two) times daily with a meal.   mometasone  (NASONEX ) 50 MCG/ACT nasal spray 2 sprays each nares once daily (Patient taking differently: as needed. 2 sprays each nares once daily)   Multiple Vitamin (MULTIVITAMIN WITH MINERALS) TABS tablet Take 1 tablet by mouth in the morning.   omeprazole  (PRILOSEC) 40 MG capsule Take 40 mg by mouth daily.   pioglitazone  (ACTOS ) 30 MG tablet Take 30 mg by mouth daily at 12 noon.    pravastatin (PRAVACHOL) 20 MG tablet Take 20 mg by mouth in the morning.   tamsulosin (FLOMAX) 0.4 MG CAPS capsule Take 0.4 mg by mouth every evening.   TRESIBA FLEXTOUCH 200 UNIT/ML FlexTouch Pen Inject 35 Units into the skin in the morning. (Patient taking differently: Inject 46 Units into the skin in the morning.)   UNABLE TO FIND Inject 1 Dose as directed every 14 (fourteen) days. Allergy shots [place] dr  vanwinkle   No current facility-administered medications for this visit. (Other)   REVIEW OF SYSTEMS: ROS   Positive for: Endocrine, Cardiovascular, Eyes Last edited by Resa Delon ORN, COT on 02/29/2024  8:26 AM.         ALLERGIES Allergies  Allergen Reactions   Augmentin  [Amoxicillin -Pot Clavulanate] Other (See Comments)    Thrush and yeast infection Has patient had a PCN reaction causing immediate rash, facial/tongue/throat swelling, SOB or lightheadedness with hypotension: No Has patient had a PCN reaction causing severe rash involving mucus membranes or skin necrosis: No Has patient had a PCN reaction that required hospitalization: No Has  patient had a PCN reaction occurring within the last 10 years: No If all of the above answers are NO, then may proceed with Cephalosporin use.     Codeine Itching   PAST MEDICAL HISTORY Past Medical History:  Diagnosis Date   Arthritis    Atrial flutter (HCC) 02/26/2011   s/p RFCA 02/2011   Bronchitis    h/o recurrent bronchitis; usually get it 1-2X/wy; last time 12/2010   Cancer [facility] 2011   right ear,basal   Constipation    Diabetes mellitus    pills   Diarrhea    GERD (gastroesophageal reflux disease)    Gout    get it in my hands and feet   Headache    sinus headache   Hemorrhoids    Hyperlipidemia    Hypertension    Kidney stone    1999   Seasonal allergies 02/26/2011   I take an allergy shot q week   Sleep apnea    no cpap used much since nasal surgery yrs ago   Past Surgical History:  Procedure Laterality Date   ATRIAL FLUTTER ABLATION N/A 02/26/2011   Procedure: ATRIAL FLUTTER ABLATION;  Surgeon: Elspeth JAYSON Sage, MD;  Location: [facility] CATH LAB;  Service: Cardiovascular;  Laterality: N/A;   BACK SURGERY  before 1995 and in 1995;    ruptured disc repaired; lower back   CARDIAC ELECTROPHYSIOLOGY STUDY AND ABLATION  02/26/2011   for atrial fib   COLONOSCOPY WITH PROPOFOL  N/A 01/18/2015   Procedure: COLONOSCOPY WITH PROPOFOL ;  Surgeon: Belvie Just, MD;  Location: [facility] ENDOSCOPY;  Service: Endoscopy;  Laterality: N/A;   COLONOSCOPY WITH PROPOFOL  N/A 02/04/2018   Procedure: COLONOSCOPY WITH PROPOFOL ;  Surgeon: Just Belvie, MD;  Location: [facility];  Service: Endoscopy;  Laterality: N/A;   COLONOSCOPY WITH PROPOFOL  N/A 08/22/2021   Procedure: COLONOSCOPY WITH PROPOFOL ;  Surgeon: Just Belvie, MD;  Location: [facility];  Service: Endoscopy;  Laterality: N/A;   ESOPHAGOGASTRODUODENOSCOPY (EGD) WITH PROPOFOL  N/A 08/22/2021   Procedure: ESOPHAGOGASTRODUODENOSCOPY (EGD) WITH PROPOFOL ;  Surgeon: Just Belvie, MD;  Location: [facility];  Service: Endoscopy;   Laterality: N/A;   LUMBAR DISC SURGERY  ~ 1993   put foam in back; in place of disc   LUMBAR DISC SURGERY  04/1993   replaced gel foam that had blown out   nasal septum surgery to open up sinuses  yrs ago   dr lazaro   POLYPECTOMY  02/04/2018   Procedure: POLYPECTOMY;  Surgeon: Just Belvie, MD;  Location: [facility];  Service: Endoscopy;;   POLYPECTOMY  08/22/2021   Procedure: POLYPECTOMY;  Surgeon: Just Belvie, MD;  Location: [facility];  Service: Endoscopy;;   SKIN CANCER EXCISION  2011   right ear   squamous cell area removed from right arm  6 weeks ago   FAMILY HISTORY Family History  Problem Relation Age of Onset  Heart attack Father    SOCIAL HISTORY Social History   Tobacco Use   Smoking status: Never   Smokeless tobacco: Current    Types: Snuff   Tobacco comments:    stopped cigars ~ 2002  Vaping Use   Vaping status: Never Used  Substance Use Topics   Alcohol use: No   Drug use: No       OPHTHALMIC EXAM:  Base Eye Exam     Visual Acuity (Snellen - Linear)       Right Left   Dist [place] 20/40 20/30 -2   Dist ph [place] 20/30 20/20         Tonometry (Tonopen, 8:32 AM)       Right Left   Pressure 19 19         Pupils       Pupils Dark Light Shape React APD   Right PERRL 3 2 Round Brisk None   Left PERRL 3 2 Round Brisk None         Visual Fields       Left Right    Full Full         Extraocular Movement       Right Left    Full, Ortho Full, Ortho         Neuro/Psych     Oriented x3: Yes   Mood/Affect: Normal         Dilation     Both eyes: 2.5% Phenylephrine  @ 8:32 AM           Slit Lamp and Fundus Exam     External Exam       Right Left   External Normal Normal         Slit Lamp Exam       Right Left   Lids/Lashes Dermatochalasis - upper lid Dermatochalasis - upper lid   Conjunctiva/Sclera Nasal and temporal Pinguecula Nasal and temporal Pinguecula   Cornea Debris in tear film Debris in tear film    Anterior Chamber Deep and clear Deep and clear   Iris Round and dilated, No NVI Round and dilated, No NVI   Lens 1-2+ Nuclear sclerosis, 1-2+ Cortical cataract 1-2+ Nuclear sclerosis, 1-2+ Cortical cataract   Anterior Vitreous Vitreous syneresis Vitreous syneresis         Fundus Exam       Right Left   Disc Pink and sharp, mild PPA Pink and sharp, mild PPA   C/D Ratio 0.4 0.5   Macula Flat, Blunted foveal reflex, focal MA/edema inferiorly --slightly improved Flat, Blunted foveal reflex, focal MA/DBH and edema -slightly improved   Vessels Vascular attenuation, Telangiectasia, no NV Vascular attenuation, Telangiectasia, no NV   Periphery Attached, scattered MA/DBH greatest posteriorly Attached, scattered MA/DBH greatest posteriorly.           Refraction     Wearing Rx       Sphere Cylinder Axis Add   Right -1.00 +1.25 003 +2.75   Left -0.75 +1.00 180 +2.75           IMAGING AND PROCEDURES  Imaging and Procedures for 02/29/2024  OCT, Retina - OU - Both Eyes       Right Eye Quality was good. Central Foveal Thickness: 430. Progression has improved. Findings include no SRF, abnormal foveal contour, intraretinal hyper-reflective material, intraretinal fluid, vitreomacular adhesion (Persistent IRF/IRHM inferior fovea and macula--slightly improved).   Left Eye Quality was good. Central Foveal Thickness: 331. Progression has improved. Findings include no  SRF, abnormal foveal contour, intraretinal hyper-reflective material, intraretinal fluid, vitreomacular adhesion (Persistent +IRF/IRHM temporal fovea and macula ).   Notes *Images captured and stored on drive  Diagnosis / Impression:  +DME OU  OD: Persistent IRF/IRHM inferior fovea and macula--slightly improved OS: Persistent IRF/IRHM temporal  fovea and macula  Clinical management:  See below  Abbreviations: NFP - Normal foveal profile. CME - cystoid macular edema. PED - pigment epithelial detachment. IRF -  intraretinal fluid. SRF - subretinal fluid. EZ - ellipsoid zone. ERM - epiretinal membrane. ORA - outer retinal atrophy. ORT - outer retinal tubulation. SRHM - subretinal hyper-reflective material. IRHM - intraretinal hyper-reflective material            ASSESSMENT/PLAN:   ICD-10-CM   1. Moderate nonproliferative diabetic retinopathy of both eyes with macular edema associated with type 2 diabetes mellitus (HCC)  E11.3313 OCT, Retina - OU - Both Eyes    2. Current use of insulin (HCC)  Z79.4     3. Diabetes mellitus treated with oral medication (HCC)  E11.9    Z79.84     4. Essential hypertension  I10     5. Hypertensive retinopathy of both eyes  H35.033     6. Combined forms of age-related cataract of both eyes  H25.813       1-3. Moderate Non-proliferative diabetic retinopathy w/ DME, both eyes  - lost to f/u from 08.26.24 to 05.14.25 (8.5 mos)  - most recent A1c 6.9% on 05.21.25 - FA (05.29.24) + Mod NPDR OU, shows OD: Scattered leaking MA greatest inferior macula, no NV, OS; Scattered leaking MA greatest temporal macula, no NV - s/p IVA OD #1 (05.29.24), #2 (07.01.24), #3 (07.29.24), #4 (08.26.24), #5 (05.14.25) - s/p IVA OS #1 (07.01.24), #2 (07.29.24), #3 (08.26.24), #4 (05.14.25)  **IVA resistance** ===========================  - s/p IVE OU #1 (06.20.2025) #2 (06.20.25), #3 (08.29.25) - BCVA OD: 20/30, OS: 20/20 - OCT shows +DME OU, OD: Mild interval increase in IRF/IRHM inferior fovea and macula; OS: Mild interval increase in IRF/IRHM temporal  fovea and macula at 10+ weeks  - recommend IVE OU #4 today, 11.11.25 w/ f/u in 5 wks - pt wishes to proceed - RBA of procedure discussed, questions answered  - IVE informed consent obtained and signed OU 06.20.25 - see procedure note - Eylea  approved through Medicare and [employer] insurance - f/u in 5 wks -- DFE/OCT, possible injection   4,5. Hypertensive retinopathy OU - discussed importance of tight BP control - monitor  6.  Mixed Cataract OU - The symptoms of cataract, surgical options, and treatments and risks were discussed with patient. - discussed diagnosis and progression - monitor  Ophthalmic Meds Ordered this visit:  No orders of the defined types were placed in this encounter.    No follow-ups on file.  There are no Patient Instructions on file for this visit.  This document serves as a record of services personally performed by Redell JUDITHANN Hans, MD, PhD. It was created on their behalf by Wanda GEANNIE Keens, COT an ophthalmic technician. The creation of this record is the provider's dictation and/or activities during the visit.    Electronically signed by:  Wanda GEANNIE Keens, COT  02/29/24 8:55 AM  This document serves as a record of services personally performed by Redell JUDITHANN Hans, MD, PhD. It was created on their behalf by Almetta Pesa, an ophthalmic technician. The creation of this record is the provider's dictation and/or activities during the visit.    Electronically signed by:  Almetta Pesa, OA, 02/29/24  8:56 AM   Redell JUDITHANN Hans, M.D., Ph.D. Diseases & Surgery of the Retina and Vitreous [facility] 12/17/2023     Abbreviations: M myopia (nearsighted); A astigmatism; H hyperopia (farsighted); P presbyopia; Mrx spectacle prescription;  CTL contact lenses; OD right eye; OS left eye; OU both eyes  XT exotropia; ET esotropia; PEK punctate epithelial keratitis; PEE punctate epithelial erosions; DES dry eye syndrome; MGD meibomian gland dysfunction; ATs artificial tears; PFAT's preservative free artificial tears; NSC nuclear sclerotic cataract; PSC posterior subcapsular cataract; ERM epi-retinal membrane; PVD posterior vitreous detachment; RD retinal detachment; DM diabetes mellitus; DR diabetic retinopathy; NPDR non-proliferative diabetic retinopathy; PDR proliferative diabetic retinopathy; CSME clinically significant macular edema; DME diabetic macular edema; dbh  dot blot hemorrhages; CWS cotton wool spot; POAG primary open angle glaucoma; C/D cup-to-disc ratio; HVF humphrey visual field; GVF goldmann visual field; OCT optical coherence tomography; IOP intraocular pressure; BRVO Branch retinal vein occlusion; CRVO central retinal vein occlusion; CRAO central retinal artery occlusion; BRAO branch retinal artery occlusion; RT retinal tear; SB scleral buckle; PPV pars plana vitrectomy; VH Vitreous hemorrhage; PRP panretinal laser photocoagulation; IVK intravitreal kenalog; VMT vitreomacular traction; MH Macular hole;  NVD neovascularization of the disc; NVE neovascularization elsewhere; AREDS age related eye disease study; ARMD age related macular degeneration; POAG primary open angle glaucoma; EBMD epithelial/anterior basement membrane dystrophy; ACIOL anterior chamber intraocular lens; IOL intraocular lens; PCIOL posterior chamber intraocular lens; Phaco/IOL phacoemulsification with intraocular lens placement; PRK photorefractive keratectomy; LASIK laser assisted in situ keratomileusis; HTN hypertension; DM diabetes mellitus; COPD chronic obstructive pulmonary disease

## 2024-02-29 ENCOUNTER — Encounter (INDEPENDENT_AMBULATORY_CARE_PROVIDER_SITE_OTHER): Payer: Self-pay | Admitting: Ophthalmology

## 2024-02-29 ENCOUNTER — Ambulatory Visit (INDEPENDENT_AMBULATORY_CARE_PROVIDER_SITE_OTHER): Admitting: Ophthalmology

## 2024-02-29 DIAGNOSIS — I10 Essential (primary) hypertension: Secondary | ICD-10-CM | POA: Diagnosis not present

## 2024-02-29 DIAGNOSIS — Z7984 Long term (current) use of oral hypoglycemic drugs: Secondary | ICD-10-CM | POA: Diagnosis not present

## 2024-02-29 DIAGNOSIS — H35033 Hypertensive retinopathy, bilateral: Secondary | ICD-10-CM

## 2024-02-29 DIAGNOSIS — H25813 Combined forms of age-related cataract, bilateral: Secondary | ICD-10-CM

## 2024-02-29 DIAGNOSIS — E113313 Type 2 diabetes mellitus with moderate nonproliferative diabetic retinopathy with macular edema, bilateral: Secondary | ICD-10-CM | POA: Diagnosis not present

## 2024-02-29 DIAGNOSIS — Z794 Long term (current) use of insulin: Secondary | ICD-10-CM | POA: Diagnosis not present

## 2024-02-29 DIAGNOSIS — E119 Type 2 diabetes mellitus without complications: Secondary | ICD-10-CM

## 2024-03-01 ENCOUNTER — Encounter (INDEPENDENT_AMBULATORY_CARE_PROVIDER_SITE_OTHER): Payer: Self-pay | Admitting: Ophthalmology

## 2024-03-01 MED ORDER — AFLIBERCEPT 2MG/0.05ML IZ SOLN FOR KALEIDOSCOPE
2.0000 mg | INTRAVITREAL | Status: AC | PRN
  Administered 2024-03-01: 2 mg via INTRAVITREAL

## 2024-03-21 NOTE — Progress Notes (Signed)
 [facility] - Clinic Note  04/04/2024   CHIEF COMPLAINT Patient presents for No chief complaint on file.  HISTORY OF PRESENT ILLNESS: Ryan Jones is a 68 y.o. male who presents to the clinic today for:   Patient states he's had a lot of personal issues going on, reason he's delayed. Nothing new health wise. Vision seems ok-had some throbbing pain x 3 days after last injection. Sugar levels have been elevated.   Referring physician: [name], MD [address] [place],  [place] [address]  HISTORICAL INFORMATION:  Selected notes from the MEDICAL RECORD NUMBER Referred by Dr. [name] for DME OU LEE:  Ocular Hx- PMH-   CURRENT MEDICATIONS: No current outpatient medications on file. (Ophthalmic Drugs)   No current facility-administered medications for this visit. (Ophthalmic Drugs)   Current Outpatient Medications (Other)  Medication Sig   albuterol  (PROVENTIL  HFA;VENTOLIN  HFA) 108 (90 Base) MCG/ACT inhaler Inhale 2 puffs into the lungs every 6 (six) hours as needed. For shortness of breath.   amLODipine  (NORVASC ) 10 MG tablet Take 10 mg by mouth in the morning.   EPINEPHrine  0.3 mg/0.3 mL IJ SOAJ injection Inject 0.3 mg into the muscle as needed for anaphylaxis.   glipiZIDE  (GLUCOTROL ) 5 MG tablet Take 5 mg by mouth in the morning.   hydrochlorothiazide  (HYDRODIURIL ) 25 MG tablet Take 25 mg by mouth in the morning.   ibuprofen (ADVIL,MOTRIN) 800 MG tablet Take 800 mg by mouth every 8 (eight) hours as needed (for pain.).    indomethacin  (INDOCIN ) 50 MG capsule TAKE 1 CAPSULE BY MOUTH THREE TIMES DAILY AS NEEDED FOR MODERATE PAIN (GOUT) (Patient taking differently: Take 50 mg by mouth as needed.)   losartan  (COZAAR ) 100 MG tablet TAKE 1 TABLET BY MOUTH EVERY DAY   metFORMIN  (GLUCOPHAGE ) 1000 MG tablet Take 1 tablet (1,000 mg total) by mouth 2 (two) times daily with a meal.   mometasone  (NASONEX ) 50 MCG/ACT nasal spray 2 sprays each nares once daily  (Patient taking differently: as needed. 2 sprays each nares once daily)   Multiple Vitamin (MULTIVITAMIN WITH MINERALS) TABS tablet Take 1 tablet by mouth in the morning.   omeprazole  (PRILOSEC) 40 MG capsule Take 40 mg by mouth daily.   pioglitazone  (ACTOS ) 30 MG tablet Take 30 mg by mouth daily at 12 noon.    pravastatin (PRAVACHOL) 20 MG tablet Take 20 mg by mouth in the morning.   tamsulosin (FLOMAX) 0.4 MG CAPS capsule Take 0.4 mg by mouth every evening.   TRESIBA FLEXTOUCH 200 UNIT/ML FlexTouch Pen Inject 35 Units into the skin in the morning. (Patient taking differently: Inject 46 Units into the skin in the morning.)   UNABLE TO FIND Inject 1 Dose as directed every 14 (fourteen) days. Allergy shots [place] dr altamease   No current facility-administered medications for this visit. (Other)   REVIEW OF SYSTEMS:   ALLERGIES Allergies  Allergen Reactions   Augmentin  [Amoxicillin -Pot Clavulanate] Other (See Comments)    Thrush and yeast infection Has patient had a PCN reaction causing immediate rash, facial/tongue/throat swelling, SOB or lightheadedness with hypotension: No Has patient had a PCN reaction causing severe rash involving mucus membranes or skin necrosis: No Has patient had a PCN reaction that required hospitalization: No Has patient had a PCN reaction occurring within the last 10 years: No If all of the above answers are NO, then may proceed with Cephalosporin use.     Codeine Itching   PAST MEDICAL HISTORY Past  Medical History:  Diagnosis Date   Arthritis    Atrial flutter (HCC) 02/26/2011   s/p RFCA 02/2011   Bronchitis    h/o recurrent bronchitis; usually get it 1-2X/wy; last time 12/2010   Cancer [facility]) 2011   right ear,basal   Constipation    Diabetes mellitus    pills   Diarrhea    GERD (gastroesophageal reflux disease)    Gout    get it in my hands and feet   Headache    sinus headache   Hemorrhoids    Hyperlipidemia    Hypertension     Kidney stone    1999   Seasonal allergies 02/26/2011   I take an allergy shot q week   Sleep apnea    no cpap used much since nasal surgery yrs ago   Past Surgical History:  Procedure Laterality Date   ATRIAL FLUTTER ABLATION N/A 02/26/2011   Procedure: ATRIAL FLUTTER ABLATION;  Surgeon: Elspeth JAYSON Sage, MD;  Location: [facility] CATH LAB;  Service: Cardiovascular;  Laterality: N/A;   BACK SURGERY  before 1995 and in 1995;    ruptured disc repaired; lower back   CARDIAC ELECTROPHYSIOLOGY STUDY AND ABLATION  02/26/2011   for atrial fib   COLONOSCOPY WITH PROPOFOL  N/A 01/18/2015   Procedure: COLONOSCOPY WITH PROPOFOL ;  Surgeon: Belvie Just, MD;  Location: [facility];  Service: Endoscopy;  Laterality: N/A;   COLONOSCOPY WITH PROPOFOL  N/A 02/04/2018   Procedure: COLONOSCOPY WITH PROPOFOL ;  Surgeon: Just Belvie, MD;  Location: [facility];  Service: Endoscopy;  Laterality: N/A;   COLONOSCOPY WITH PROPOFOL  N/A 08/22/2021   Procedure: COLONOSCOPY WITH PROPOFOL ;  Surgeon: Just Belvie, MD;  Location: [facility];  Service: Endoscopy;  Laterality: N/A;   ESOPHAGOGASTRODUODENOSCOPY (EGD) WITH PROPOFOL  N/A 08/22/2021   Procedure: ESOPHAGOGASTRODUODENOSCOPY (EGD) WITH PROPOFOL ;  Surgeon: Just Belvie, MD;  Location: [facility];  Service: Endoscopy;  Laterality: N/A;   LUMBAR DISC SURGERY  ~ 1993   put foam in back; in place of disc   LUMBAR DISC SURGERY  04/1993   replaced gel foam that had blown out   nasal septum surgery to open up sinuses  yrs ago   dr lazaro   POLYPECTOMY  02/04/2018   Procedure: POLYPECTOMY;  Surgeon: Just Belvie, MD;  Location: [facility];  Service: Endoscopy;;   POLYPECTOMY  08/22/2021   Procedure: POLYPECTOMY;  Surgeon: Just Belvie, MD;  Location: [facility];  Service: Endoscopy;;   SKIN CANCER EXCISION  2011   right ear   squamous cell area removed from right arm  6 weeks ago   FAMILY HISTORY Family History  Problem Relation Age of Onset   Heart attack  Father    SOCIAL HISTORY Social History   Tobacco Use   Smoking status: Never   Smokeless tobacco: Current    Types: Snuff   Tobacco comments:    stopped cigars ~ 2002  Vaping Use   Vaping status: Never Used  Substance Use Topics   Alcohol use: No   Drug use: No       OPHTHALMIC EXAM:  Not recorded    IMAGING AND PROCEDURES  Imaging and Procedures for 04/04/2024          ASSESSMENT/PLAN: No diagnosis found.   1-3. Moderate Non-proliferative diabetic retinopathy w/ DME, both eyes  - delayed f/u - 10+ wks instead of 5 on 11.11.25  - lost to f/u from 08.26.24 to 05.14.25 (8.5 mos)  - most recent A1c 6.9% on 05.21.25 - FA (05.29.24) +  Mod NPDR OU, shows OD: Scattered leaking MA greatest inferior macula, no NV, OS; Scattered leaking MA greatest temporal macula, no NV - s/p IVA OD #1 (05.29.24), #2 (07.01.24), #3 (07.29.24), #4 (08.26.24), #5 (05.14.25) - s/p IVA OS #1 (07.01.24), #2 (07.29.24), #3 (08.26.24), #4 (05.14.25)  **IVA resistance** ===========================  - s/p IVE OU #1 (06.20.2025) #2 (06.20.25), #3 (08.29.25), #4 (11.11.25) - BCVA OD: 20/30, OS: 20/20 - OCT shows +DME OU, OD: Mild interval increase in IRF/IRHM inferior fovea and macula; OS: Mild interval increase in IRF/IRHM temporal  fovea and macula at 10+ weeks  - recommend IVE OU #5 today, 12.16.25 w/ f/u back to 5 wks - pt wishes to proceed - RBA of procedure discussed, questions answered  - IVE informed consent obtained and signed OU 06.20.25 - see procedure note - Eylea  approved through Medicare and [employer] insurance - f/u in 5 wks -- DFE/OCT, possible injection   4,5. Hypertensive retinopathy OU - discussed importance of tight BP control - monitor  6. Mixed Cataract OU - The symptoms of cataract, surgical options, and treatments and risks were discussed with patient. - discussed diagnosis and progression - monitor  Ophthalmic Meds Ordered this visit:  No orders of the defined  types were placed in this encounter.    No follow-ups on file.  There are no Patient Instructions on file for this visit.  This document serves as a record of services personally performed by Redell JUDITHANN Hans, MD, PhD. It was created on their behalf by Wanda GEANNIE Keens, COT an ophthalmic technician. The creation of this record is the provider's dictation and/or activities during the visit.    Electronically signed by:  Wanda GEANNIE Keens, COT  03/21/24 9:43 AM  Redell JUDITHANN Hans, M.D., Ph.D. Diseases & Surgery of the Retina and Vitreous [facility]   Abbreviations: M myopia (nearsighted); A astigmatism; H hyperopia (farsighted); P presbyopia; Mrx spectacle prescription;  CTL contact lenses; OD right eye; OS left eye; OU both eyes  XT exotropia; ET esotropia; PEK punctate epithelial keratitis; PEE punctate epithelial erosions; DES dry eye syndrome; MGD meibomian gland dysfunction; ATs artificial tears; PFAT's preservative free artificial tears; NSC nuclear sclerotic cataract; PSC posterior subcapsular cataract; ERM epi-retinal membrane; PVD posterior vitreous detachment; RD retinal detachment; DM diabetes mellitus; DR diabetic retinopathy; NPDR non-proliferative diabetic retinopathy; PDR proliferative diabetic retinopathy; CSME clinically significant macular edema; DME diabetic macular edema; dbh dot blot hemorrhages; CWS cotton wool spot; POAG primary open angle glaucoma; C/D cup-to-disc ratio; HVF humphrey visual field; GVF goldmann visual field; OCT optical coherence tomography; IOP intraocular pressure; BRVO Branch retinal vein occlusion; CRVO central retinal vein occlusion; CRAO central retinal artery occlusion; BRAO branch retinal artery occlusion; RT retinal tear; SB scleral buckle; PPV pars plana vitrectomy; VH Vitreous hemorrhage; PRP panretinal laser photocoagulation; IVK intravitreal kenalog ; VMT vitreomacular traction; MH Macular hole;  NVD neovascularization of  the disc; NVE neovascularization elsewhere; AREDS age related eye disease study; ARMD age related macular degeneration; POAG primary open angle glaucoma; EBMD epithelial/anterior basement membrane dystrophy; ACIOL anterior chamber intraocular lens; IOL intraocular lens; PCIOL posterior chamber intraocular lens; Phaco/IOL phacoemulsification with intraocular lens placement; PRK photorefractive keratectomy; LASIK laser assisted in situ keratomileusis; HTN hypertension; DM diabetes mellitus; COPD chronic obstructive pulmonary disease

## 2024-03-28 ENCOUNTER — Ambulatory Visit

## 2024-03-30 ENCOUNTER — Encounter: Payer: Self-pay | Admitting: Podiatry

## 2024-03-30 ENCOUNTER — Ambulatory Visit (INDEPENDENT_AMBULATORY_CARE_PROVIDER_SITE_OTHER): Admitting: Podiatry

## 2024-03-30 ENCOUNTER — Ambulatory Visit (INDEPENDENT_AMBULATORY_CARE_PROVIDER_SITE_OTHER)

## 2024-03-30 DIAGNOSIS — M722 Plantar fascial fibromatosis: Secondary | ICD-10-CM

## 2024-03-30 MED ORDER — TRIAMCINOLONE ACETONIDE 10 MG/ML IJ SUSP
10.0000 mg | Freq: Once | INTRAMUSCULAR | Status: AC
  Administered 2024-03-30: 10 mg via INTRA_ARTICULAR

## 2024-03-30 NOTE — Progress Notes (Signed)
 Subjective:   Patient ID: Ryan Jones, male   DOB: 68 y.o.   MRN: [id number]   HPI Patient presents stating that he has a lot of pain in his left arch and that he did step on a limb last Thursday.  Patient states it is inflamed and hard to walk on.   Review of Systems  All other systems reviewed and are negative.       Objective:  Physical Exam Vitals and nursing note reviewed.  Constitutional:      Appearance: He is well-developed.  Pulmonary:     Effort: Pulmonary effort is normal.  Musculoskeletal:        General: Normal range of motion.  Skin:    General: Skin is warm.  Neurological:     Mental Status: He is alert.     Neurovascular status found to be intact muscle strength found to be adequate range of motion adequate with patient found to have inflammation of the mid arch area left fluid buildup around this area I do not think the tendon is torn but I think it is quite inflamed in this area secondary to injury     Assessment:  Acute fasciitis left with inflammation that I think is more related to trauma or old injury with no current indications of operative care     Plan:  H&P reviewed we may need to immobilize organ to try medication first and I did sterile prep and injected the mid arch 3 mg Kenalog 5 mg Xylocaine  applied sterile dressing and if symptoms are not better in the next 1 to 2 weeks I want to see back for consideration of immobilization  X-rays indicate that there does not appear to be damage to the tendon at this time it appears to be soft tissue I did not note any bone injury

## 2024-04-04 ENCOUNTER — Ambulatory Visit (INDEPENDENT_AMBULATORY_CARE_PROVIDER_SITE_OTHER): Admitting: Ophthalmology

## 2024-04-04 ENCOUNTER — Encounter (INDEPENDENT_AMBULATORY_CARE_PROVIDER_SITE_OTHER): Payer: Self-pay | Admitting: Ophthalmology

## 2024-04-04 DIAGNOSIS — I10 Essential (primary) hypertension: Secondary | ICD-10-CM

## 2024-04-04 DIAGNOSIS — Z7984 Long term (current) use of oral hypoglycemic drugs: Secondary | ICD-10-CM

## 2024-04-04 DIAGNOSIS — H35033 Hypertensive retinopathy, bilateral: Secondary | ICD-10-CM

## 2024-04-04 DIAGNOSIS — Z794 Long term (current) use of insulin: Secondary | ICD-10-CM

## 2024-04-04 DIAGNOSIS — H25813 Combined forms of age-related cataract, bilateral: Secondary | ICD-10-CM

## 2024-04-04 DIAGNOSIS — E119 Type 2 diabetes mellitus without complications: Secondary | ICD-10-CM

## 2024-04-04 DIAGNOSIS — E113313 Type 2 diabetes mellitus with moderate nonproliferative diabetic retinopathy with macular edema, bilateral: Secondary | ICD-10-CM

## 2024-04-04 MED ORDER — AFLIBERCEPT 2MG/0.05ML IZ SOLN FOR KALEIDOSCOPE
2.0000 mg | INTRAVITREAL | Status: AC | PRN
  Administered 2024-04-04: 18:00:00 2 mg via INTRAVITREAL

## 2024-04-25 NOTE — Progress Notes (Shared)
 " [facility] - Clinic Note  05/09/2024   CHIEF COMPLAINT Patient presents for No chief complaint on file.  HISTORY OF PRESENT ILLNESS: Ryan Jones is a 69 y.o. male who presents to the clinic today for:    Patient feels the vision is improving.  Referring physician: [name], MD [address] [place],  [place] [address]  HISTORICAL INFORMATION:  Selected notes from the MEDICAL RECORD NUMBER Referred by Dr. [name] for DME OU LEE:  Ocular Hx- PMH-   CURRENT MEDICATIONS: No current outpatient medications on file. (Ophthalmic Drugs)   No current facility-administered medications for this visit. (Ophthalmic Drugs)   Current Outpatient Medications (Other)  Medication Sig   albuterol  (PROVENTIL  HFA;VENTOLIN  HFA) 108 (90 Base) MCG/ACT inhaler Inhale 2 puffs into the lungs every 6 (six) hours as needed. For shortness of breath.   amLODipine  (NORVASC ) 10 MG tablet Take 10 mg by mouth in the morning.   dapagliflozin  propanediol (FARXIGA ) 5 MG TABS tablet Take 5 mg by mouth daily.   EPINEPHrine  0.3 mg/0.3 mL IJ SOAJ injection Inject 0.3 mg into the muscle as needed for anaphylaxis.   glipiZIDE  (GLUCOTROL ) 5 MG tablet Take 5 mg by mouth in the morning.   hydrochlorothiazide  (HYDRODIURIL ) 25 MG tablet Take 25 mg by mouth in the morning.   ibuprofen (ADVIL,MOTRIN) 800 MG tablet Take 800 mg by mouth every 8 (eight) hours as needed (for pain.).    indomethacin  (INDOCIN ) 50 MG capsule TAKE 1 CAPSULE BY MOUTH THREE TIMES DAILY AS NEEDED FOR MODERATE PAIN (GOUT) (Patient taking differently: Take 50 mg by mouth as needed.)   losartan  (COZAAR ) 100 MG tablet TAKE 1 TABLET BY MOUTH EVERY DAY   metFORMIN  (GLUCOPHAGE ) 1000 MG tablet Take 1 tablet (1,000 mg total) by mouth 2 (two) times daily with a meal.   mometasone  (NASONEX ) 50 MCG/ACT nasal spray 2 sprays each nares once daily (Patient taking differently: as needed. 2 sprays each nares once daily)   Multiple Vitamin  (MULTIVITAMIN WITH MINERALS) TABS tablet Take 1 tablet by mouth in the morning.   omeprazole  (PRILOSEC) 40 MG capsule Take 40 mg by mouth daily.   pioglitazone  (ACTOS ) 30 MG tablet Take 30 mg by mouth daily at 12 noon.  (Patient not taking: Reported on 04/04/2024)   pravastatin (PRAVACHOL) 20 MG tablet Take 20 mg by mouth in the morning.   tamsulosin (FLOMAX) 0.4 MG CAPS capsule Take 0.4 mg by mouth every evening.   TRESIBA FLEXTOUCH 200 UNIT/ML FlexTouch Pen Inject 35 Units into the skin in the morning. (Patient taking differently: Inject 46 Units into the skin in the morning.)   UNABLE TO FIND Inject 1 Dose as directed every 14 (fourteen) days. Allergy shots [place] dr altamease   No current facility-administered medications for this visit. (Other)   REVIEW OF SYSTEMS:   ALLERGIES Allergies  Allergen Reactions   Augmentin  [Amoxicillin -Pot Clavulanate] Other (See Comments)    Thrush and yeast infection Has patient had a PCN reaction causing immediate rash, facial/tongue/throat swelling, SOB or lightheadedness with hypotension: No Has patient had a PCN reaction causing severe rash involving mucus membranes or skin necrosis: No Has patient had a PCN reaction that required hospitalization: No Has patient had a PCN reaction occurring within the last 10 years: No If all of the above answers are NO, then may proceed with Cephalosporin use.     Codeine Itching   PAST MEDICAL HISTORY Past Medical History:  Diagnosis Date  Arthritis    Atrial flutter (HCC) 02/26/2011   s/p RFCA 02/2011   Bronchitis    h/o recurrent bronchitis; usually get it 1-2X/wy; last time 12/2010   Cancer [facility]) 2011   right ear,basal   Constipation    Diabetes mellitus    pills   Diarrhea    GERD (gastroesophageal reflux disease)    Gout    get it in my hands and feet   Headache    sinus headache   Hemorrhoids    Hyperlipidemia    Hypertension    Kidney stone    1999   Seasonal allergies  02/26/2011   I take an allergy shot q week   Sleep apnea    no cpap used much since nasal surgery yrs ago   Past Surgical History:  Procedure Laterality Date   ATRIAL FLUTTER ABLATION N/A 02/26/2011   Procedure: ATRIAL FLUTTER ABLATION;  Surgeon: Elspeth JAYSON Sage, MD;  Location: [facility] CATH LAB;  Service: Cardiovascular;  Laterality: N/A;   BACK SURGERY  before 1995 and in 1995;    ruptured disc repaired; lower back   CARDIAC ELECTROPHYSIOLOGY STUDY AND ABLATION  02/26/2011   for atrial fib   COLONOSCOPY WITH PROPOFOL  N/A 01/18/2015   Procedure: COLONOSCOPY WITH PROPOFOL ;  Surgeon: Belvie Just, MD;  Location: [facility];  Service: Endoscopy;  Laterality: N/A;   COLONOSCOPY WITH PROPOFOL  N/A 02/04/2018   Procedure: COLONOSCOPY WITH PROPOFOL ;  Surgeon: Just Belvie, MD;  Location: [facility];  Service: Endoscopy;  Laterality: N/A;   COLONOSCOPY WITH PROPOFOL  N/A 08/22/2021   Procedure: COLONOSCOPY WITH PROPOFOL ;  Surgeon: Just Belvie, MD;  Location: [facility];  Service: Endoscopy;  Laterality: N/A;   ESOPHAGOGASTRODUODENOSCOPY (EGD) WITH PROPOFOL  N/A 08/22/2021   Procedure: ESOPHAGOGASTRODUODENOSCOPY (EGD) WITH PROPOFOL ;  Surgeon: Just Belvie, MD;  Location: [facility];  Service: Endoscopy;  Laterality: N/A;   LUMBAR DISC SURGERY  ~ 1993   put foam in back; in place of disc   LUMBAR DISC SURGERY  04/1993   replaced gel foam that had blown out   nasal septum surgery to open up sinuses  yrs ago   dr lazaro   POLYPECTOMY  02/04/2018   Procedure: POLYPECTOMY;  Surgeon: Just Belvie, MD;  Location: [facility];  Service: Endoscopy;;   POLYPECTOMY  08/22/2021   Procedure: POLYPECTOMY;  Surgeon: Just Belvie, MD;  Location: [facility];  Service: Endoscopy;;   SKIN CANCER EXCISION  2011   right ear   squamous cell area removed from right arm  6 weeks ago   FAMILY HISTORY Family History  Problem Relation Age of Onset   Heart attack Father    SOCIAL HISTORY Social History    Tobacco Use   Smoking status: Never   Smokeless tobacco: Current    Types: Snuff   Tobacco comments:    stopped cigars ~ 2002  Vaping Use   Vaping status: Never Used  Substance Use Topics   Alcohol use: No   Drug use: No       OPHTHALMIC EXAM:  Not recorded    IMAGING AND PROCEDURES  Imaging and Procedures for 05/09/2024           ASSESSMENT/PLAN: No diagnosis found.  1-3. Moderate Non-proliferative diabetic retinopathy w/ DME, both eyes  - delayed f/u - 10+ wks instead of 5 on 11.11.25  - lost to f/u from 08.26.24 to 05.14.25 (8.5 mos)  - most recent A1c 6.9% on 05.21.25 - FA (05.29.24) + Mod NPDR OU, shows OD: Scattered  leaking MA greatest inferior macula, no NV, OS; Scattered leaking MA greatest temporal macula, no NV - s/p IVA OD #1 (05.29.24), #2 (07.01.24), #3 (07.29.24), #4 (08.26.24), #5 (05.14.25) - s/p IVA OS #1 (07.01.24), #2 (07.29.24), #3 (08.26.24), #4 (05.14.25)   **IVA resistance** =========================== - s/p IVE OU #1 (06.20.2025) #2 (06.20.25), #3 (08.29.25), #4 (11.11.25), #5 (12.16.25) - BCVA OD: 20/20 from 20/30, OS: 20/20 - OCT shows OD: Persistent IRF/IRHM inferior fovea and macula--slightly improved, OS: Mild interval improvement in IRF/IRHM temporal  fovea and macula at 5 weeks  - recommend IVE OU #6 today, 01.20.26 w/ f/u in 5 wks - pt wishes to proceed - RBA of procedure discussed, questions answered  - IVE informed consent obtained and signed OU 06.20.25 - see procedure note - Eylea  approved through Medicare and [employer] insurance - f/u in 5 wks -- DFE/OCT, possible injection   4,5. Hypertensive retinopathy OU - discussed importance of tight BP control - monitor  6. Mixed Cataract OU - The symptoms of cataract, surgical options, and treatments and risks were discussed with patient. - discussed diagnosis and progression - monitor  Ophthalmic Meds Ordered this visit:  No orders of the defined types were placed in this  encounter.    No follow-ups on file.  There are no Patient Instructions on file for this visit.  This document serves as a record of services personally performed by Redell JUDITHANN Hans, MD, PhD. It was created on their behalf by Wanda GEANNIE Keens, COT an ophthalmic technician. The creation of this record is the provider's dictation and/or activities during the visit.    Electronically signed by:  Wanda GEANNIE Keens, COT  04/25/2024 9:59 AM  Redell JUDITHANN Hans, M.D., Ph.D. Diseases & Surgery of the Retina and Vitreous [facility]    Abbreviations: M myopia (nearsighted); A astigmatism; H hyperopia (farsighted); P presbyopia; Mrx spectacle prescription;  CTL contact lenses; OD right eye; OS left eye; OU both eyes  XT exotropia; ET esotropia; PEK punctate epithelial keratitis; PEE punctate epithelial erosions; DES dry eye syndrome; MGD meibomian gland dysfunction; ATs artificial tears; PFAT's preservative free artificial tears; NSC nuclear sclerotic cataract; PSC posterior subcapsular cataract; ERM epi-retinal membrane; PVD posterior vitreous detachment; RD retinal detachment; DM diabetes mellitus; DR diabetic retinopathy; NPDR non-proliferative diabetic retinopathy; PDR proliferative diabetic retinopathy; CSME clinically significant macular edema; DME diabetic macular edema; dbh dot blot hemorrhages; CWS cotton wool spot; POAG primary open angle glaucoma; C/D cup-to-disc ratio; HVF humphrey visual field; GVF goldmann visual field; OCT optical coherence tomography; IOP intraocular pressure; BRVO Branch retinal vein occlusion; CRVO central retinal vein occlusion; CRAO central retinal artery occlusion; BRAO branch retinal artery occlusion; RT retinal tear; SB scleral buckle; PPV pars plana vitrectomy; VH Vitreous hemorrhage; PRP panretinal laser photocoagulation; IVK intravitreal kenalog ; VMT vitreomacular traction; MH Macular hole;  NVD neovascularization of the disc; NVE  neovascularization elsewhere; AREDS age related eye disease study; ARMD age related macular degeneration; POAG primary open angle glaucoma; EBMD epithelial/anterior basement membrane dystrophy; ACIOL anterior chamber intraocular lens; IOL intraocular lens; PCIOL posterior chamber intraocular lens; Phaco/IOL phacoemulsification with intraocular lens placement; PRK photorefractive keratectomy; LASIK laser assisted in situ keratomileusis; HTN hypertension; DM diabetes mellitus; COPD chronic obstructive pulmonary disease  "

## 2024-05-09 ENCOUNTER — Encounter (INDEPENDENT_AMBULATORY_CARE_PROVIDER_SITE_OTHER): Admitting: Ophthalmology

## 2024-05-09 DIAGNOSIS — Z794 Long term (current) use of insulin: Secondary | ICD-10-CM

## 2024-05-09 DIAGNOSIS — E119 Type 2 diabetes mellitus without complications: Secondary | ICD-10-CM

## 2024-05-09 DIAGNOSIS — I10 Essential (primary) hypertension: Secondary | ICD-10-CM

## 2024-05-09 DIAGNOSIS — H25813 Combined forms of age-related cataract, bilateral: Secondary | ICD-10-CM

## 2024-05-09 DIAGNOSIS — E113313 Type 2 diabetes mellitus with moderate nonproliferative diabetic retinopathy with macular edema, bilateral: Secondary | ICD-10-CM

## 2024-05-09 DIAGNOSIS — H35033 Hypertensive retinopathy, bilateral: Secondary | ICD-10-CM

## 2024-05-09 NOTE — Progress Notes (Shared)
 " [facility] - Clinic Note  05/15/2024   CHIEF COMPLAINT Patient presents for No chief complaint on file.  HISTORY OF PRESENT ILLNESS: Ryan Jones is a 69 y.o. male who presents to the clinic today for:    Patient feels the vision is improving.  Referring physician: [name], MD [address] [place],  [place] [address]  HISTORICAL INFORMATION:  Selected notes from the MEDICAL RECORD NUMBER Referred by Dr. [name] for DME OU LEE:  Ocular Hx- PMH-   CURRENT MEDICATIONS: No current outpatient medications on file. (Ophthalmic Drugs)   No current facility-administered medications for this visit. (Ophthalmic Drugs)   Current Outpatient Medications (Other)  Medication Sig   albuterol  (PROVENTIL  HFA;VENTOLIN  HFA) 108 (90 Base) MCG/ACT inhaler Inhale 2 puffs into the lungs every 6 (six) hours as needed. For shortness of breath.   amLODipine  (NORVASC ) 10 MG tablet Take 10 mg by mouth in the morning.   dapagliflozin  propanediol (FARXIGA ) 5 MG TABS tablet Take 5 mg by mouth daily.   EPINEPHrine  0.3 mg/0.3 mL IJ SOAJ injection Inject 0.3 mg into the muscle as needed for anaphylaxis.   glipiZIDE  (GLUCOTROL ) 5 MG tablet Take 5 mg by mouth in the morning.   hydrochlorothiazide  (HYDRODIURIL ) 25 MG tablet Take 25 mg by mouth in the morning.   ibuprofen (ADVIL,MOTRIN) 800 MG tablet Take 800 mg by mouth every 8 (eight) hours as needed (for pain.).    indomethacin  (INDOCIN ) 50 MG capsule TAKE 1 CAPSULE BY MOUTH THREE TIMES DAILY AS NEEDED FOR MODERATE PAIN (GOUT) (Patient taking differently: Take 50 mg by mouth as needed.)   losartan  (COZAAR ) 100 MG tablet TAKE 1 TABLET BY MOUTH EVERY DAY   metFORMIN  (GLUCOPHAGE ) 1000 MG tablet Take 1 tablet (1,000 mg total) by mouth 2 (two) times daily with a meal.   mometasone  (NASONEX ) 50 MCG/ACT nasal spray 2 sprays each nares once daily (Patient taking differently: as needed. 2 sprays each nares once daily)   Multiple Vitamin  (MULTIVITAMIN WITH MINERALS) TABS tablet Take 1 tablet by mouth in the morning.   omeprazole  (PRILOSEC) 40 MG capsule Take 40 mg by mouth daily.   pioglitazone  (ACTOS ) 30 MG tablet Take 30 mg by mouth daily at 12 noon.  (Patient not taking: Reported on 04/04/2024)   pravastatin (PRAVACHOL) 20 MG tablet Take 20 mg by mouth in the morning.   tamsulosin (FLOMAX) 0.4 MG CAPS capsule Take 0.4 mg by mouth every evening.   TRESIBA FLEXTOUCH 200 UNIT/ML FlexTouch Pen Inject 35 Units into the skin in the morning. (Patient taking differently: Inject 46 Units into the skin in the morning.)   UNABLE TO FIND Inject 1 Dose as directed every 14 (fourteen) days. Allergy shots [place] dr altamease   No current facility-administered medications for this visit. (Other)   REVIEW OF SYSTEMS:   ALLERGIES Allergies  Allergen Reactions   Augmentin  [Amoxicillin -Pot Clavulanate] Other (See Comments)    Thrush and yeast infection Has patient had a PCN reaction causing immediate rash, facial/tongue/throat swelling, SOB or lightheadedness with hypotension: No Has patient had a PCN reaction causing severe rash involving mucus membranes or skin necrosis: No Has patient had a PCN reaction that required hospitalization: No Has patient had a PCN reaction occurring within the last 10 years: No If all of the above answers are NO, then may proceed with Cephalosporin use.     Codeine Itching   PAST MEDICAL HISTORY Past Medical History:  Diagnosis Date  Arthritis    Atrial flutter (HCC) 02/26/2011   s/p RFCA 02/2011   Bronchitis    h/o recurrent bronchitis; usually get it 1-2X/wy; last time 12/2010   Cancer [facility]) 2011   right ear,basal   Constipation    Diabetes mellitus    pills   Diarrhea    GERD (gastroesophageal reflux disease)    Gout    get it in my hands and feet   Headache    sinus headache   Hemorrhoids    Hyperlipidemia    Hypertension    Kidney stone    1999   Seasonal allergies  02/26/2011   I take an allergy shot q week   Sleep apnea    no cpap used much since nasal surgery yrs ago   Past Surgical History:  Procedure Laterality Date   ATRIAL FLUTTER ABLATION N/A 02/26/2011   Procedure: ATRIAL FLUTTER ABLATION;  Surgeon: Elspeth JAYSON Sage, MD;  Location: [facility] CATH LAB;  Service: Cardiovascular;  Laterality: N/A;   BACK SURGERY  before 1995 and in 1995;    ruptured disc repaired; lower back   CARDIAC ELECTROPHYSIOLOGY STUDY AND ABLATION  02/26/2011   for atrial fib   COLONOSCOPY WITH PROPOFOL  N/A 01/18/2015   Procedure: COLONOSCOPY WITH PROPOFOL ;  Surgeon: Belvie Just, MD;  Location: [facility] ENDOSCOPY;  Service: Endoscopy;  Laterality: N/A;   COLONOSCOPY WITH PROPOFOL  N/A 02/04/2018   Procedure: COLONOSCOPY WITH PROPOFOL ;  Surgeon: Just Belvie, MD;  Location: [facility]OS[facility];  Service: Endoscopy;  Laterality: N/A;   COLONOSCOPY WITH PROPOFOL  N/A 08/22/2021   Procedure: COLONOSCOPY WITH PROPOFOL ;  Surgeon: Just Belvie, MD;  Location: [facility];  Service: Endoscopy;  Laterality: N/A;   ESOPHAGOGASTRODUODENOSCOPY (EGD) WITH PROPOFOL  N/A 08/22/2021   Procedure: ESOPHAGOGASTRODUODENOSCOPY (EGD) WITH PROPOFOL ;  Surgeon: Just Belvie, MD;  Location: [facility];  Service: Endoscopy;  Laterality: N/A;   LUMBAR DISC SURGERY  ~ 1993   put foam in back; in place of disc   LUMBAR DISC SURGERY  04/1993   replaced gel foam that had blown out   nasal septum surgery to open up sinuses  yrs ago   dr lazaro   POLYPECTOMY  02/04/2018   Procedure: POLYPECTOMY;  Surgeon: Just Belvie, MD;  Location: [facility];  Service: Endoscopy;;   POLYPECTOMY  08/22/2021   Procedure: POLYPECTOMY;  Surgeon: Just Belvie, MD;  Location: [facility];  Service: Endoscopy;;   SKIN CANCER EXCISION  2011   right ear   squamous cell area removed from right arm  6 weeks ago   FAMILY HISTORY Family History  Problem Relation Age of Onset   Heart attack Father    SOCIAL HISTORY Social History    Tobacco Use   Smoking status: Never   Smokeless tobacco: Current    Types: Snuff   Tobacco comments:    stopped cigars ~ 2002  Vaping Use   Vaping status: Never Used  Substance Use Topics   Alcohol use: No   Drug use: No       OPHTHALMIC EXAM:  Not recorded    IMAGING AND PROCEDURES  Imaging and Procedures for 05/15/2024           ASSESSMENT/PLAN:   ICD-10-CM   1. Moderate nonproliferative diabetic retinopathy of both eyes with macular edema associated with type 2 diabetes mellitus (HCC)  Z88.6686     2. Current use of insulin (HCC)  Z79.4     3. Diabetes mellitus treated with oral medication (HCC)  E11.9  Z79.84     4. Essential hypertension  I10     5. Hypertensive retinopathy of both eyes  H35.033     6. Combined forms of age-related cataract of both eyes  H25.813       1-3. Moderate Non-proliferative diabetic retinopathy w/ DME, both eyes  - delayed f/u - 10+ wks instead of 5 on 11.11.25  - lost to f/u from 08.26.24 to 05.14.25 (8.5 mos)  - most recent A1c 6.9% on 05.21.25 - FA (05.29.24) + Mod NPDR OU, shows OD: Scattered leaking MA greatest inferior macula, no NV, OS; Scattered leaking MA greatest temporal macula, no NV - s/p IVA OD #1 (05.29.24), #2 (07.01.24), #3 (07.29.24), #4 (08.26.24), #5 (05.14.25) - s/p IVA OS #1 (07.01.24), #2 (07.29.24), #3 (08.26.24), #4 (05.14.25)   **IVA resistance** =========================== - s/p IVE OU #1 (06.20.2025) #2 (06.20.25), #3 (08.29.25), #4 (11.11.25), #5 (12.16.25) - BCVA OD: 20/20 from 20/30, OS: 20/20 - OCT shows OD: Persistent IRF/IRHM inferior fovea and macula--slightly improved, OS: Mild interval improvement in IRF/IRHM temporal  fovea and macula at 5 weeks  - recommend IVE OU #6 today, 01.26.26 w/ f/u in 5 wks - pt wishes to proceed - RBA of procedure discussed, questions answered  - IVE informed consent obtained and signed OU 06.20.25 - see procedure note - Eylea  approved through Medicare  and [employer] insurance - f/u in 5 wks -- DFE/OCT, possible injection  4,5. Hypertensive retinopathy OU - discussed importance of tight BP control - monitor   6. Mixed Cataract OU - The symptoms of cataract, surgical options, and treatments and risks were discussed with patient. - discussed diagnosis and progression - monitor   Ophthalmic Meds Ordered this visit:  No orders of the defined types were placed in this encounter.    No follow-ups on file.  There are no Patient Instructions on file for this visit.  This document serves as a record of services personally performed by Redell JUDITHANN Hans, MD, PhD. It was created on their behalf by Paulina Jamse Gay an ophthalmic technician. The creation of this record is the provider's dictation and/or activities during the visit.   Electronically signed by: Paulina JONETTA Gay  05/09/24  10:26 AM   Redell JUDITHANN Hans, M.D., Ph.D. Diseases & Surgery of the Retina and Vitreous [facility]  Abbreviations: M myopia (nearsighted); A astigmatism; H hyperopia (farsighted); P presbyopia; Mrx spectacle prescription;  CTL contact lenses; OD right eye; OS left eye; OU both eyes  XT exotropia; ET esotropia; PEK punctate epithelial keratitis; PEE punctate epithelial erosions; DES dry eye syndrome; MGD meibomian gland dysfunction; ATs artificial tears; PFAT's preservative free artificial tears; NSC nuclear sclerotic cataract; PSC posterior subcapsular cataract; ERM epi-retinal membrane; PVD posterior vitreous detachment; RD retinal detachment; DM diabetes mellitus; DR diabetic retinopathy; NPDR non-proliferative diabetic retinopathy; PDR proliferative diabetic retinopathy; CSME clinically significant macular edema; DME diabetic macular edema; dbh dot blot hemorrhages; CWS cotton wool spot; POAG primary open angle glaucoma; C/D cup-to-disc ratio; HVF humphrey visual field; GVF goldmann visual field; OCT optical coherence tomography; IOP intraocular  pressure; BRVO Branch retinal vein occlusion; CRVO central retinal vein occlusion; CRAO central retinal artery occlusion; BRAO branch retinal artery occlusion; RT retinal tear; SB scleral buckle; PPV pars plana vitrectomy; VH Vitreous hemorrhage; PRP panretinal laser photocoagulation; IVK intravitreal kenalog ; VMT vitreomacular traction; MH Macular hole;  NVD neovascularization of the disc; NVE neovascularization elsewhere; AREDS age related eye disease study; ARMD age related macular degeneration; POAG primary open  angle glaucoma; EBMD epithelial/anterior basement membrane dystrophy; ACIOL anterior chamber intraocular lens; IOL intraocular lens; PCIOL posterior chamber intraocular lens; Phaco/IOL phacoemulsification with intraocular lens placement; PRK photorefractive keratectomy; LASIK laser assisted in situ keratomileusis; HTN hypertension; DM diabetes mellitus; COPD chronic obstructive pulmonary disease  "

## 2024-05-10 NOTE — Progress Notes (Signed)
 " [facility] - Clinic Note  05/18/2024   CHIEF COMPLAINT Patient presents for Retina Follow Up  HISTORY OF PRESENT ILLNESS: Ryan Jones is a 69 y.o. male who presents to the clinic today for:  HPI     Retina Follow Up   Patient presents with  Diabetic Retinopathy.  In both eyes.  This started 5 weeks ago.  Duration of 5 weeks.  Since onset it is stable.  I, the attending physician,  performed the HPI with the patient and updated documentation appropriately.        Comments   5 week retina follow up NPDR and I'VE OU pt is reporting no vision changes noticed he denies any flashes or floaters       Last edited by Ryan Rogue, MD on 05/18/2024  2:33 PM.     Patient states the vision seems to be holding steady.  Referring physician: [name], MD [address] [place],  [place] [address]  HISTORICAL INFORMATION:  Selected notes from the MEDICAL RECORD NUMBER Referred by Dr. Glendia Jones for DME OU LEE:  Ocular Hx- PMH-   CURRENT MEDICATIONS: No current outpatient medications on file. (Ophthalmic Drugs)   No current facility-administered medications for this visit. (Ophthalmic Drugs)   Current Outpatient Medications (Other)  Medication Sig   albuterol  (PROVENTIL  HFA;VENTOLIN  HFA) 108 (90 Base) MCG/ACT inhaler Inhale 2 puffs into the lungs every 6 (six) hours as needed. For shortness of breath.   amLODipine  (NORVASC ) 10 MG tablet Take 10 mg by mouth in the morning.   dapagliflozin  propanediol (FARXIGA ) 5 MG TABS tablet Take 5 mg by mouth daily.   EPINEPHrine  0.3 mg/0.3 mL IJ SOAJ injection Inject 0.3 mg into the muscle as needed for anaphylaxis.   glipiZIDE  (GLUCOTROL ) 5 MG tablet Take 5 mg by mouth in the morning.   hydrochlorothiazide  (HYDRODIURIL ) 25 MG tablet Take 25 mg by mouth in the morning.   ibuprofen (ADVIL,MOTRIN) 800 MG tablet Take 800 mg by mouth every 8 (eight) hours as needed (for pain.).    indomethacin  (INDOCIN ) 50 MG capsule TAKE  1 CAPSULE BY MOUTH THREE TIMES DAILY AS NEEDED FOR MODERATE PAIN (GOUT) (Patient taking differently: Take 50 mg by mouth as needed.)   losartan  (COZAAR ) 100 MG tablet TAKE 1 TABLET BY MOUTH EVERY DAY   metFORMIN  (GLUCOPHAGE ) 1000 MG tablet Take 1 tablet (1,000 mg total) by mouth 2 (two) times daily with a meal.   mometasone  (NASONEX ) 50 MCG/ACT nasal spray 2 sprays each nares once daily (Patient taking differently: as needed. 2 sprays each nares once daily)   Multiple Vitamin (MULTIVITAMIN WITH MINERALS) TABS tablet Take 1 tablet by mouth in the morning.   omeprazole  (PRILOSEC) 40 MG capsule Take 40 mg by mouth daily.   pioglitazone  (ACTOS ) 30 MG tablet Take 30 mg by mouth daily at 12 noon.    pravastatin (PRAVACHOL) 20 MG tablet Take 20 mg by mouth in the morning.   tamsulosin (FLOMAX) 0.4 MG CAPS capsule Take 0.4 mg by mouth every evening.   TRESIBA FLEXTOUCH 200 UNIT/ML FlexTouch Pen Inject 35 Units into the skin in the morning. (Patient taking differently: Inject 46 Units into the skin in the morning.)   UNABLE TO FIND Inject 1 Dose as directed every 14 (fourteen) days. Allergy shots [place] altamease   No current facility-administered medications for this visit. (Other)   REVIEW OF SYSTEMS: ROS   Positive for: Endocrine, Cardiovascular, Eyes Last edited by Ryan,  Ryan Jones, Ryan Jones on 05/18/2024  9:25 AM.      ALLERGIES Allergies  Allergen Reactions   Augmentin  [Amoxicillin -Pot Clavulanate] Other (See Comments)    Thrush and yeast infection Has patient had a PCN reaction causing immediate rash, facial/tongue/throat swelling, SOB or lightheadedness with hypotension: No Has patient had a PCN reaction causing severe rash involving mucus membranes or skin necrosis: No Has patient had a PCN reaction that required hospitalization: No Has patient had a PCN reaction occurring within the last 10 years: No If all of the above answers are NO, then may proceed with Cephalosporin use.      Codeine Itching   PAST MEDICAL HISTORY Past Medical History:  Diagnosis Date   Arthritis    Atrial flutter (HCC) 02/26/2011   s/p RFCA 02/2011   Bronchitis    h/o recurrent bronchitis; usually get it 1-2X/wy; last time 12/2010   Cancer [facility]) 2011   right ear,basal   Constipation    Diabetes mellitus    pills   Diarrhea    GERD (gastroesophageal reflux disease)    Gout    get it in my hands and feet   Headache    sinus headache   Hemorrhoids    Hyperlipidemia    Hypertension    Kidney stone    1999   Seasonal allergies 02/26/2011   I take an allergy shot q week   Sleep apnea    no cpap used much since nasal surgery yrs ago   Past Surgical History:  Procedure Laterality Date   ATRIAL FLUTTER ABLATION N/A 02/26/2011   Procedure: ATRIAL FLUTTER ABLATION;  Surgeon: Ryan JAYSON Sage, MD;  Location: [facility] CATH LAB;  Service: Cardiovascular;  Laterality: N/A;   BACK SURGERY  before 1995 and in 1995;    ruptured disc repaired; lower back   CARDIAC ELECTROPHYSIOLOGY STUDY AND ABLATION  02/26/2011   for atrial fib   COLONOSCOPY WITH PROPOFOL  N/A 01/18/2015   Procedure: COLONOSCOPY WITH PROPOFOL ;  Surgeon: Ryan Just, MD;  Location: [facility] ENDOSCOPY;  Service: Endoscopy;  Laterality: N/A;   COLONOSCOPY WITH PROPOFOL  N/A 02/04/2018   Procedure: COLONOSCOPY WITH PROPOFOL ;  Surgeon: Jones Belvie, MD;  Location: [facility];  Service: Endoscopy;  Laterality: N/A;   COLONOSCOPY WITH PROPOFOL  N/A 08/22/2021   Procedure: COLONOSCOPY WITH PROPOFOL ;  Surgeon: Jones Belvie, MD;  Location: [facility];  Service: Endoscopy;  Laterality: N/A;   ESOPHAGOGASTRODUODENOSCOPY (EGD) WITH PROPOFOL  N/A 08/22/2021   Procedure: ESOPHAGOGASTRODUODENOSCOPY (EGD) WITH PROPOFOL ;  Surgeon: Jones Belvie, MD;  Location: [facility];  Service: Endoscopy;  Laterality: N/A;   LUMBAR DISC SURGERY  ~ 1993   put foam in back; in place of disc   LUMBAR DISC SURGERY  04/1993   replaced gel foam that had blown  out   nasal septum surgery to open up sinuses  yrs ago   dr lazaro   POLYPECTOMY  02/04/2018   Procedure: POLYPECTOMY;  Surgeon: Jones Belvie, MD;  Location: [facility];  Service: Endoscopy;;   POLYPECTOMY  08/22/2021   Procedure: POLYPECTOMY;  Surgeon: Jones Belvie, MD;  Location: [facility];  Service: Endoscopy;;   SKIN CANCER EXCISION  2011   right ear   squamous cell area removed from right arm  6 weeks ago   FAMILY HISTORY Family History  Problem Relation Age of Onset   Heart attack Father    SOCIAL HISTORY Social History   Tobacco Use   Smoking status: Never   Smokeless tobacco: Current    Types: Snuff  Tobacco comments:    stopped cigars ~ 2002  Vaping Use   Vaping status: Never Used  Substance Use Topics   Alcohol use: No   Drug use: No       OPHTHALMIC EXAM:  Base Eye Exam     Visual Acuity (Snellen - Linear)       Right Left   Dist cc 20/30 20/25   Dist ph cc 20/25 -3 NI         Tonometry (Tonopen, 9:30 AM)       Right Left   Pressure 16 17         Pupils       Pupils Dark Light Shape React APD   Right PERRL 3 2 Round Brisk None   Left PERRL 3 2 Round Brisk None         Visual Fields       Left Right    Full Full         Extraocular Movement       Right Left    Full, Ortho Full, Ortho         Neuro/Psych     Oriented x3: Yes   Mood/Affect: Normal         Dilation     Both eyes: 2.5% Phenylephrine  @ 9:30 AM           Slit Lamp and Fundus Exam     External Exam       Right Left   External Normal Normal         Slit Lamp Exam       Right Left   Lids/Lashes Dermatochalasis - upper lid Dermatochalasis - upper lid   Conjunctiva/Sclera Nasal and temporal Pinguecula Nasal and temporal Pinguecula   Cornea Debris in tear film Debris in tear film   Anterior Chamber Deep and clear Deep and clear   Iris Round and dilated, No NVI Round and dilated, No NVI   Lens 1-2+ Nuclear sclerosis, 1-2+ Cortical  cataract 1-2+ Nuclear sclerosis, 1-2+ Cortical cataract   Anterior Vitreous Vitreous syneresis Vitreous syneresis         Fundus Exam       Right Left   Disc Pink and sharp, mild PPA Pink and sharp, mild PPA   C/D Ratio 0.4 0.5   Macula Flat, Blunted foveal reflex, focal MA/edema inferiorly Flat, Blunted foveal reflex, focal MA/DBH and edema- improved   Vessels Vascular attenuation, tortous, Telangiectasia, no NV Vascular attenuation, tortuous, Telangiectasia, no NV   Periphery Attached, scattered MA/DBH greatest posteriorly Attached, scattered MA/DBH greatest posteriorly.           Refraction     Wearing Rx       Sphere Cylinder Axis Add   Right -0.50 +1.50 011 +2.25   Left -1.00 +1.50 001 +2.25           IMAGING AND PROCEDURES  Imaging and Procedures for 05/18/2024  OCT, Retina - OU - Both Eyes       Right Eye Quality was good. Central Foveal Thickness: 404. Progression has improved. Findings include no SRF, abnormal foveal contour, intraretinal hyper-reflective material, intraretinal fluid, vitreomacular adhesion (Persistent IRF/IRHM inferior fovea and macula--slightly improved).   Left Eye Quality was good. Central Foveal Thickness: 327. Progression has improved. Findings include no SRF, abnormal foveal contour, intraretinal hyper-reflective material, intraretinal fluid, vitreomacular adhesion (Persistent IRF/IRHM temporal fovea and macula-- slightly improved).   Notes *Images captured and stored on drive  Diagnosis / Impression:  +  DME OU  OD: Persistent IRF/IRHM inferior fovea and macula--slightly improved OS: Persistent IRF/IRHM temporal fovea and macula-- slightly improved  Clinical management:  See below  Abbreviations: NFP - Normal foveal profile. CME - cystoid macular edema. PED - pigment epithelial detachment. IRF - intraretinal fluid. SRF - subretinal fluid. EZ - ellipsoid zone. ERM - epiretinal membrane. ORA - outer retinal atrophy. ORT - outer  retinal tubulation. SRHM - subretinal hyper-reflective material. IRHM - intraretinal hyper-reflective material      Intravitreal Injection, Pharmacologic Agent - OD - Right Eye       Time Out 05/18/2024. 10:25 AM. Confirmed correct patient, procedure, site, and patient consented.   Anesthesia Topical anesthesia was used. Anesthetic medications included Lidocaine  2%, Proparacaine 0.5%.   Procedure Preparation included 5% betadine to ocular surface, eyelid speculum. A (32g) needle was used.   Injection: 2 mg aflibercept  2 MG/0.05ML   Route: Intravitreal, Site: Right Eye   NDC: Q956576, Lot: [id number], Expiration date: 05/19/2025, Waste: 0 mL   Post-op Post injection exam found visual acuity of at least counting fingers. The patient tolerated the procedure well. There were no complications. The patient received written and verbal post procedure care education.   Notes       Intravitreal Injection, Pharmacologic Agent - OS - Left Eye       Time Out 05/18/2024. 10:26 AM. Confirmed correct patient, procedure, site, and patient consented.   Anesthesia Topical anesthesia was used. Anesthetic medications included Lidocaine  2%, Proparacaine 0.5%.   Procedure Preparation included 5% betadine to ocular surface, eyelid speculum. A (32g) needle was used.   Injection: 2 mg aflibercept  2 MG/0.05ML   Route: Intravitreal, Site: Left Eye   NDC: Q956576, Lot: [id number], Expiration date: 05/19/2025, Waste: 0 mL   Post-op Post injection exam found visual acuity of at least counting fingers. The patient tolerated the procedure well. There were no complications. The patient received written and verbal post procedure care education.           ASSESSMENT/PLAN:   ICD-10-CM   1. Moderate nonproliferative diabetic retinopathy of both eyes with macular edema associated with type 2 diabetes mellitus (HCC)  E11.3313 OCT, Retina - OU - Both Eyes    Intravitreal Injection,  Pharmacologic Agent - OD - Right Eye    Intravitreal Injection, Pharmacologic Agent - OS - Left Eye    aflibercept  (EYLEA ) SOLN 2 mg    aflibercept  (EYLEA ) SOLN 2 mg    2. Current use of insulin (HCC)  Z79.4     3. Diabetes mellitus treated with oral medication (HCC)  E11.9    Z79.84     4. Essential hypertension  I10     5. Hypertensive retinopathy of both eyes  H35.033     6. Combined forms of age-related cataract of both eyes  H25.813      1-3. Moderate Non-proliferative diabetic retinopathy w/ DME, both eyes  - delayed f/u - 10+ wks instead of 5 on 11.11.25  - lost to f/u from 08.26.24 to 05.14.25 (8.5 mos)  - most recent A1c 8.6 (12.01.25), 6.9% on 05.21.25 - s/p IVA OD #1 (05.29.24), #2 (07.01.24), #3 (07.29.24), #4 (08.26.24), #5 (05.14.25) - s/p IVA OS #1 (07.01.24), #2 (07.29.24), #3 (08.26.24), #4 (05.14.25)  **IVA resistance** =========================== - s/p IVE OU #1 (06.20.2025) #2 (06.20.25), #3 (08.29.25), #4 (11.11.25), #5 (12.16.25) - FA (05.29.24) + Mod NPDR OU, shows OD: Scattered leaking MA greatest inferior macula, no NV, OS; Scattered leaking MA greatest temporal macula, no NV -  BCVA OD: 20/25 from 20/20, OS: 20/25 from 20/20 - OCT shows OD: Persistent IRF/IRHM inferior fovea and macula--slightly improved, OS: Persistent IRF/IRHM temporal fovea and macula-- slightly improved at 6 weeks  - recommend IVE OU #6 today, 01.29.26 w/ f/u in 5 wks - pt wishes to proceed - RBA of procedure discussed, questions answered  - IVE informed consent obtained and signed OU 06.20.25 - see procedure note - Eylea  approved through Medicare and [employer] insurance - f/u in 5 wks -- DFE/OCT, possible injection   4,5. Hypertensive retinopathy OU - discussed importance of tight BP control - monitor  6. Mixed Cataract OU - The symptoms of cataract, surgical options, and treatments and risks were discussed with patient. - discussed diagnosis and progression -  monitor  Ophthalmic Meds Ordered this visit:  Meds ordered this encounter  Medications   aflibercept  (EYLEA ) SOLN 2 mg   aflibercept  (EYLEA ) SOLN 2 mg     Return in about 5 weeks (around 06/22/2024) for f/u, NPDR, DFE, OCT, Possible, IVE, OU.  There are no Patient Instructions on file for this visit.  This document serves as a record of services personally performed by Redell JUDITHANN Hans, MD, PhD. It was created on their behalf by Avelina Pereyra, COA an ophthalmic technician. The creation of this record is the provider's dictation and/or activities during the visit.   Electronically signed by: Avelina GORMAN Pereyra, Ryan Jones  05/18/24  2:34 PM   This document serves as a record of services personally performed by Redell JUDITHANN Hans, MD, PhD. It was created on their behalf by Almetta Pesa, an ophthalmic technician. The creation of this record is the provider's dictation and/or activities during the visit.    Electronically signed by: Almetta Pesa, OA, 05/18/24  2:34 PM  This document serves as a record of services personally performed by Redell JUDITHANN Hans, MD, PhD. It was created on their behalf by Wanda GEANNIE Keens, Ryan Jones an ophthalmic technician. The creation of this record is the provider's dictation and/or activities during the visit.    Electronically signed by:  Wanda GEANNIE Keens, Ryan Jones  05/18/24 2:34 PM  Redell JUDITHANN Hans, M.D., Ph.D. Diseases & Surgery of the Retina and Vitreous [facility]  I have reviewed the above documentation for accuracy and completeness, and I agree with the above. Redell JUDITHANN Hans, M.D., Ph.D. 05/18/24 2:35 PM   Abbreviations: M myopia (nearsighted); A astigmatism; H hyperopia (farsighted); P presbyopia; Mrx spectacle prescription;  CTL contact lenses; OD right eye; OS left eye; OU both eyes  XT exotropia; ET esotropia; PEK punctate epithelial keratitis; PEE punctate epithelial erosions; DES dry eye syndrome; MGD meibomian gland dysfunction; ATs  artificial tears; PFAT's preservative free artificial tears; NSC nuclear sclerotic cataract; PSC posterior subcapsular cataract; ERM epi-retinal membrane; PVD posterior vitreous detachment; RD retinal detachment; DM diabetes mellitus; DR diabetic retinopathy; NPDR non-proliferative diabetic retinopathy; PDR proliferative diabetic retinopathy; CSME clinically significant macular edema; DME diabetic macular edema; dbh dot blot hemorrhages; CWS cotton wool spot; POAG primary open angle glaucoma; C/D cup-to-disc ratio; HVF humphrey visual field; GVF goldmann visual field; OCT optical coherence tomography; IOP intraocular pressure; BRVO Branch retinal vein occlusion; CRVO central retinal vein occlusion; CRAO central retinal artery occlusion; BRAO branch retinal artery occlusion; RT retinal tear; SB scleral buckle; PPV pars plana vitrectomy; VH Vitreous hemorrhage; PRP panretinal laser photocoagulation; IVK intravitreal kenalog ; VMT vitreomacular traction; MH Macular hole;  NVD neovascularization of the disc; NVE neovascularization elsewhere; AREDS age related eye disease study;  ARMD age related macular degeneration; POAG primary open angle glaucoma; EBMD epithelial/anterior basement membrane dystrophy; ACIOL anterior chamber intraocular lens; IOL intraocular lens; PCIOL posterior chamber intraocular lens; Phaco/IOL phacoemulsification with intraocular lens placement; PRK photorefractive keratectomy; LASIK laser assisted in situ keratomileusis; HTN hypertension; DM diabetes mellitus; COPD chronic obstructive pulmonary disease  "

## 2024-05-15 ENCOUNTER — Encounter (INDEPENDENT_AMBULATORY_CARE_PROVIDER_SITE_OTHER): Admitting: Ophthalmology

## 2024-05-18 ENCOUNTER — Encounter (INDEPENDENT_AMBULATORY_CARE_PROVIDER_SITE_OTHER): Payer: Self-pay | Admitting: Ophthalmology

## 2024-05-18 ENCOUNTER — Ambulatory Visit (INDEPENDENT_AMBULATORY_CARE_PROVIDER_SITE_OTHER): Admitting: Ophthalmology

## 2024-05-18 DIAGNOSIS — Z794 Long term (current) use of insulin: Secondary | ICD-10-CM | POA: Diagnosis not present

## 2024-05-18 DIAGNOSIS — H35033 Hypertensive retinopathy, bilateral: Secondary | ICD-10-CM | POA: Diagnosis not present

## 2024-05-18 DIAGNOSIS — Z7984 Long term (current) use of oral hypoglycemic drugs: Secondary | ICD-10-CM

## 2024-05-18 DIAGNOSIS — I10 Essential (primary) hypertension: Secondary | ICD-10-CM | POA: Diagnosis not present

## 2024-05-18 DIAGNOSIS — E113313 Type 2 diabetes mellitus with moderate nonproliferative diabetic retinopathy with macular edema, bilateral: Secondary | ICD-10-CM | POA: Diagnosis not present

## 2024-05-18 DIAGNOSIS — H25813 Combined forms of age-related cataract, bilateral: Secondary | ICD-10-CM

## 2024-05-18 DIAGNOSIS — E119 Type 2 diabetes mellitus without complications: Secondary | ICD-10-CM

## 2024-05-18 MED ORDER — AFLIBERCEPT 2MG/0.05ML IZ SOLN FOR KALEIDOSCOPE
2.0000 mg | INTRAVITREAL | Status: AC | PRN
  Administered 2024-05-18: 2 mg via INTRAVITREAL

## 2024-06-22 ENCOUNTER — Encounter (INDEPENDENT_AMBULATORY_CARE_PROVIDER_SITE_OTHER): Admitting: Ophthalmology
